# Patient Record
Sex: Male | Born: 1955
Health system: Southern US, Community
[De-identification: ages and names within clinical notes are randomized; demographics above are authoritative.]

## PROBLEM LIST (undated history)

## (undated) DIAGNOSIS — E7800 Pure hypercholesterolemia, unspecified: Secondary | ICD-10-CM

## (undated) DIAGNOSIS — N189 Chronic kidney disease, unspecified: Secondary | ICD-10-CM

## (undated) DIAGNOSIS — I619 Nontraumatic intracerebral hemorrhage, unspecified: Secondary | ICD-10-CM

## (undated) DIAGNOSIS — I639 Cerebral infarction, unspecified: Secondary | ICD-10-CM

## (undated) DIAGNOSIS — I10 Essential (primary) hypertension: Secondary | ICD-10-CM

## (undated) HISTORY — PX: SMALL INTESTINE SURGERY: SHX150

## (undated) HISTORY — DX: Nontraumatic intracerebral hemorrhage, unspecified: I61.9

## (undated) HISTORY — DX: Chronic kidney disease, unspecified: N18.9

---

## 2005-05-12 DIAGNOSIS — I619 Nontraumatic intracerebral hemorrhage, unspecified: Secondary | ICD-10-CM

## 2005-05-12 HISTORY — DX: Nontraumatic intracerebral hemorrhage, unspecified: I61.9

## 2005-05-12 HISTORY — PX: BRAIN SURGERY: SHX531

## 2007-05-27 ENCOUNTER — Emergency Department (HOSPITAL_COMMUNITY): Admission: EM | Admit: 2007-05-27 | Discharge: 2007-05-28 | Payer: Self-pay | Admitting: Emergency Medicine

## 2007-06-11 ENCOUNTER — Ambulatory Visit: Payer: Self-pay | Admitting: Internal Medicine

## 2007-06-17 ENCOUNTER — Ambulatory Visit: Payer: Self-pay | Admitting: Internal Medicine

## 2007-06-17 LAB — CONVERTED CEMR LAB
AST: 13 units/L (ref 0–37)
Albumin: 4.8 g/dL (ref 3.5–5.2)
Alkaline Phosphatase: 83 units/L (ref 39–117)
BUN: 31 mg/dL — ABNORMAL HIGH (ref 6–23)
Basophils Relative: 1 % (ref 0–1)
Creatinine, Ser: 3.16 mg/dL — ABNORMAL HIGH (ref 0.40–1.50)
Eosinophils Absolute: 0.3 10*3/uL (ref 0.0–0.7)
Eosinophils Relative: 9 % — ABNORMAL HIGH (ref 0–5)
Glucose, Bld: 83 mg/dL (ref 70–99)
HCT: 42.3 % (ref 39.0–52.0)
HDL: 42 mg/dL (ref >39)
LDL Cholesterol: 118 mg/dL — ABNORMAL HIGH (ref 0–99)
Lymphs Abs: 1.3 10*3/uL (ref 0.7–4.0)
MCHC: 32.2 g/dL (ref 30.0–36.0)
MCV: 82.9 fL (ref 78.0–100.0)
Monocytes Absolute: 0.2 10*3/uL (ref 0.1–1.0)
Monocytes Relative: 6 % (ref 3–12)
Neutrophils Relative %: 49 % (ref 43–77)
PSA: 0.36 ng/mL (ref 0.10–4.00)
Potassium: 4.2 meq/L (ref 3.5–5.3)
RBC: 5.1 M/uL (ref 4.22–5.81)
Total Bilirubin: 0.8 mg/dL (ref 0.3–1.2)
Total CHOL/HDL Ratio: 4.2
Triglycerides: 83 mg/dL (ref <150)
VLDL: 17 mg/dL (ref 0–40)
WBC: 3.6 10*3/uL — ABNORMAL LOW (ref 4.0–10.5)

## 2007-08-02 ENCOUNTER — Ambulatory Visit: Payer: Self-pay | Admitting: Family Medicine

## 2007-08-02 ENCOUNTER — Ambulatory Visit: Payer: Self-pay | Admitting: *Deleted

## 2007-11-01 ENCOUNTER — Ambulatory Visit: Payer: Self-pay | Admitting: Internal Medicine

## 2007-11-02 ENCOUNTER — Encounter: Payer: Self-pay | Admitting: Family Medicine

## 2007-11-02 LAB — CONVERTED CEMR LAB
Albumin: 4.4 g/dL (ref 3.5–5.2)
Basophils Absolute: 0 10*3/uL (ref 0.0–0.1)
Basophils Relative: 1 % (ref 0–1)
Chloride: 109 meq/L (ref 96–112)
Creatinine, Ser: 3.38 mg/dL — ABNORMAL HIGH (ref 0.40–1.50)
Hemoglobin: 12 g/dL — ABNORMAL LOW (ref 13.0–17.0)
Lymphocytes Relative: 31 % (ref 12–46)
MCHC: 30.9 g/dL (ref 30.0–36.0)
Monocytes Absolute: 0.3 10*3/uL (ref 0.1–1.0)
Neutro Abs: 1.9 10*3/uL (ref 1.7–7.7)
PTH: 181 pg/mL — ABNORMAL HIGH (ref 14.0–72.0)
Phosphorus: 2.8 mg/dL (ref 2.3–4.6)
Platelets: 219 10*3/uL (ref 150–400)
RDW: 14.4 % (ref 11.5–15.5)
Sodium: 144 meq/L (ref 135–145)
TSH: 1.849 microintl units/mL (ref 0.350–5.50)

## 2007-11-22 ENCOUNTER — Encounter: Admission: RE | Admit: 2007-11-22 | Discharge: 2008-02-20 | Payer: Self-pay | Admitting: Family Medicine

## 2008-02-14 ENCOUNTER — Ambulatory Visit: Payer: Self-pay | Admitting: Internal Medicine

## 2008-03-21 ENCOUNTER — Ambulatory Visit: Payer: Self-pay | Admitting: Family Medicine

## 2008-03-21 ENCOUNTER — Encounter: Payer: Self-pay | Admitting: Family Medicine

## 2008-03-21 LAB — CONVERTED CEMR LAB
Albumin: 4.3 g/dL (ref 3.5–5.2)
BUN: 21 mg/dL (ref 6–23)
Calcium: 9.5 mg/dL (ref 8.4–10.5)
Glucose, Bld: 91 mg/dL (ref 70–99)
Phosphorus: 2.6 mg/dL (ref 2.3–4.6)

## 2008-03-27 ENCOUNTER — Ambulatory Visit: Payer: Self-pay | Admitting: Family Medicine

## 2008-04-20 ENCOUNTER — Ambulatory Visit: Payer: Self-pay | Admitting: Internal Medicine

## 2008-04-20 ENCOUNTER — Encounter: Payer: Self-pay | Admitting: Family Medicine

## 2008-04-20 LAB — CONVERTED CEMR LAB: LDL Cholesterol: 77 mg/dL (ref 0–99)

## 2008-06-20 ENCOUNTER — Ambulatory Visit: Payer: Self-pay | Admitting: Family Medicine

## 2008-06-20 LAB — CONVERTED CEMR LAB
Albumin: 4.7 g/dL (ref 3.5–5.2)
BUN: 34 mg/dL — ABNORMAL HIGH (ref 6–23)
CO2: 19 meq/L (ref 19–32)
Calcium, Total (PTH): 9.7 mg/dL (ref 8.4–10.5)
Creatinine, Ser: 3.22 mg/dL — ABNORMAL HIGH (ref 0.40–1.50)
Eosinophils Relative: 8 % — ABNORMAL HIGH (ref 0–5)
Glucose, Bld: 103 mg/dL — ABNORMAL HIGH (ref 70–99)
HCT: 39.3 % (ref 39.0–52.0)
Hemoglobin: 13.1 g/dL (ref 13.0–17.0)
Lymphocytes Relative: 35 % (ref 12–46)
Lymphs Abs: 1.3 10*3/uL (ref 0.7–4.0)
Monocytes Absolute: 0.3 10*3/uL (ref 0.1–1.0)
Monocytes Relative: 7 % (ref 3–12)
RBC: 4.79 M/uL (ref 4.22–5.81)
Sodium: 141 meq/L (ref 135–145)
WBC: 3.8 10*3/uL — ABNORMAL LOW (ref 4.0–10.5)

## 2008-12-18 ENCOUNTER — Encounter: Payer: Self-pay | Admitting: Family Medicine

## 2008-12-18 ENCOUNTER — Ambulatory Visit: Payer: Self-pay | Admitting: Family Medicine

## 2008-12-18 LAB — CONVERTED CEMR LAB
Albumin: 4.5 g/dL (ref 3.5–5.2)
Alkaline Phosphatase: 64 units/L (ref 39–117)
BUN: 35 mg/dL — ABNORMAL HIGH (ref 6–23)
Basophils Absolute: 0 10*3/uL (ref 0.0–0.1)
Calcium: 9.3 mg/dL (ref 8.4–10.5)
Chloride: 109 meq/L (ref 96–112)
Cholesterol: 149 mg/dL (ref 0–200)
Eosinophils Relative: 8 % — ABNORMAL HIGH (ref 0–5)
Glucose, Bld: 77 mg/dL (ref 70–99)
HCT: 36.9 % — ABNORMAL LOW (ref 39.0–52.0)
HDL: 35 mg/dL — ABNORMAL LOW (ref >39)
Hemoglobin: 12.1 g/dL — ABNORMAL LOW (ref 13.0–17.0)
Lymphocytes Relative: 39 % (ref 12–46)
Monocytes Absolute: 0.4 10*3/uL (ref 0.1–1.0)
Potassium: 4.5 meq/L (ref 3.5–5.3)
RDW: 13.6 % (ref 11.5–15.5)
Triglycerides: 108 mg/dL (ref <150)

## 2009-03-26 ENCOUNTER — Encounter: Payer: Self-pay | Admitting: Family Medicine

## 2009-03-26 ENCOUNTER — Ambulatory Visit: Payer: Self-pay | Admitting: Internal Medicine

## 2009-03-26 LAB — CONVERTED CEMR LAB
BUN: 29 mg/dL — ABNORMAL HIGH (ref 6–23)
CO2: 23 meq/L (ref 19–32)
Calcium: 9.4 mg/dL (ref 8.4–10.5)
Chloride: 107 meq/L (ref 96–112)
Creatinine, Ser: 3.31 mg/dL — ABNORMAL HIGH (ref 0.40–1.50)

## 2009-10-23 ENCOUNTER — Ambulatory Visit: Payer: Self-pay | Admitting: Internal Medicine

## 2009-10-23 LAB — CONVERTED CEMR LAB
AST: 20 units/L (ref 0–37)
Albumin: 4.8 g/dL (ref 3.5–5.2)
BUN: 32 mg/dL — ABNORMAL HIGH (ref 6–23)
Calcium: 9.6 mg/dL (ref 8.4–10.5)
Chloride: 107 meq/L (ref 96–112)
Direct LDL: 97 mg/dL
Eosinophils Relative: 7 % — ABNORMAL HIGH (ref 0–5)
HCT: 39.7 % (ref 39.0–52.0)
Hemoglobin: 12.7 g/dL — ABNORMAL LOW (ref 13.0–17.0)
Lymphocytes Relative: 34 % (ref 12–46)
Lymphs Abs: 1.5 10*3/uL (ref 0.7–4.0)
Monocytes Absolute: 0.4 10*3/uL (ref 0.1–1.0)
Neutro Abs: 2.2 10*3/uL (ref 1.7–7.7)
Platelets: 199 10*3/uL (ref 150–400)
Potassium: 4.7 meq/L (ref 3.5–5.3)
Sodium: 143 meq/L (ref 135–145)
Total Protein: 7.3 g/dL (ref 6.0–8.3)
WBC: 4.4 10*3/uL (ref 4.0–10.5)

## 2010-01-16 ENCOUNTER — Ambulatory Visit: Payer: Self-pay | Admitting: Internal Medicine

## 2010-01-16 LAB — CONVERTED CEMR LAB
ALT: 14 units/L (ref 0–53)
BUN: 26 mg/dL — ABNORMAL HIGH (ref 6–23)
CO2: 26 meq/L (ref 19–32)
Calcium: 9.4 mg/dL (ref 8.4–10.5)
Creatinine, Ser: 3.24 mg/dL — ABNORMAL HIGH (ref 0.40–1.50)
Glucose, Bld: 91 mg/dL (ref 70–99)
Total Bilirubin: 0.7 mg/dL (ref 0.3–1.2)

## 2010-08-06 ENCOUNTER — Other Ambulatory Visit: Payer: Self-pay | Admitting: Internal Medicine

## 2010-08-07 NOTE — Telephone Encounter (Signed)
Patient is at [facility]

## 2011-06-23 DIAGNOSIS — I10 Essential (primary) hypertension: Secondary | ICD-10-CM | POA: Diagnosis not present

## 2011-06-23 DIAGNOSIS — E785 Hyperlipidemia, unspecified: Secondary | ICD-10-CM | POA: Diagnosis not present

## 2011-07-21 DIAGNOSIS — H00019 Hordeolum externum unspecified eye, unspecified eyelid: Secondary | ICD-10-CM | POA: Diagnosis not present

## 2011-08-07 DIAGNOSIS — H0019 Chalazion unspecified eye, unspecified eyelid: Secondary | ICD-10-CM | POA: Diagnosis not present

## 2012-02-27 ENCOUNTER — Emergency Department (HOSPITAL_COMMUNITY)
Admission: EM | Admit: 2012-02-27 | Discharge: 2012-02-27 | Disposition: A | Discharge status: Home / Self Care | Payer: Medicare Other | Attending: Emergency Medicine | Admitting: Emergency Medicine

## 2012-02-27 ENCOUNTER — Encounter (HOSPITAL_COMMUNITY): Payer: Self-pay | Admitting: Emergency Medicine

## 2012-02-27 DIAGNOSIS — E7800 Pure hypercholesterolemia, unspecified: Secondary | ICD-10-CM | POA: Insufficient documentation

## 2012-02-27 DIAGNOSIS — I10 Essential (primary) hypertension: Secondary | ICD-10-CM | POA: Insufficient documentation

## 2012-02-27 DIAGNOSIS — K089 Disorder of teeth and supporting structures, unspecified: Secondary | ICD-10-CM | POA: Insufficient documentation

## 2012-02-27 DIAGNOSIS — Z8673 Personal history of transient ischemic attack (TIA), and cerebral infarction without residual deficits: Secondary | ICD-10-CM | POA: Diagnosis not present

## 2012-02-27 DIAGNOSIS — K0889 Other specified disorders of teeth and supporting structures: Secondary | ICD-10-CM

## 2012-02-27 HISTORY — DX: Essential (primary) hypertension: I10

## 2012-02-27 HISTORY — DX: Cerebral infarction, unspecified: I63.9

## 2012-02-27 HISTORY — DX: Pure hypercholesterolemia, unspecified: E78.00

## 2012-02-27 MED ORDER — ONDANSETRON 4 MG PO TBDP
4.0000 mg | ORAL_TABLET | Freq: Once | ORAL | Status: AC
  Administered 2012-02-27: 4 mg via ORAL
  Filled 2012-02-27: qty 1

## 2012-02-27 MED ORDER — ONDANSETRON HCL 4 MG PO TABS: 4.0000 mg | ORAL_TABLET | Freq: Four times a day (QID) | ORAL | Status: DC

## 2012-02-27 MED ORDER — AMOXICILLIN 500 MG PO CAPS: 500.0000 mg | ORAL_CAPSULE | Freq: Three times a day (TID) | ORAL | Status: DC

## 2012-02-27 MED ORDER — OXYCODONE-ACETAMINOPHEN 5-325 MG PO TABS
2.0000 | ORAL_TABLET | Freq: Once | ORAL | Status: AC
  Administered 2012-02-27: 2 via ORAL
  Filled 2012-02-27: qty 2

## 2012-02-27 MED ORDER — OXYCODONE-ACETAMINOPHEN 5-325 MG PO TABS: 1.0000 | ORAL_TABLET | Freq: Four times a day (QID) | ORAL | Status: DC | PRN

## 2012-02-27 NOTE — ED Provider Notes (Signed)
History     CSN: [id number]  Arrival date & time 02/27/12  [id number]   First MD Initiated Contact with Patient 02/27/12 [phone]      Chief Complaint  Patient presents with  . Dental Pain    (Consider location/radiation/quality/duration/timing/severity/associated sxs/prior treatment) HPI  Patient presents to the emergency department with a dental complaint. Symptoms began this morning. The patient has tried to alleviate pain with Ibuprofen.  Pain rated at a 10/10, characterized as throbbing in nature and located bilateral lower molars. Patient denies fever, night sweats, chills, difficulty swallowing or opening mouth, SOB, nuchal rigidity or decreased ROM of neck.  Patient does not have a dentist and requests a resource guide at discharge.   Past Medical History  Diagnosis Date  . Hypertension   . Stroke   . Hypercholesteremia     History reviewed. No pertinent past surgical history.  No family history on file.  History  Substance Use Topics  . Smoking status: Never Smoker   . Smokeless tobacco: Not on file  . Alcohol Use: No      Review of Systems   Review of Systems  Gen: no weight loss, fevers, chills, night sweats  Eyes: no discharge or drainage, no occular pain or visual changes  Nose: no epistaxis or rhinorrhea  Mouth: + dental pain, no sore throat  Neck: no neck pain  Lungs:No wheezing, coughing or hemoptysis CV: no chest pain, palpitations, dependent edema or orthopnea  Abd: no abdominal pain, nausea, vomiting  GU: no dysuria or gross hematuria  MSK:  No abnormalities  Neuro: no headache, no focal neurologic deficits  Skin: no abnormalities Psyche: negative.    Allergies  Review of patient's allergies indicates no known allergies.  Home Medications  No current outpatient prescriptions on file.  BP 164/98  Pulse 63  Temp 97.8 F (36.6 C) (Oral)  Resp 14  SpO2 99%  Physical Exam  Nursing note and vitals reviewed. Constitutional: He appears  well-developed and well-nourished. No distress.  HENT:  Head: Normocephalic and atraumatic.  Mouth/Throat: Dental caries present.    Eyes: Conjunctivae normal and EOM are normal. Pupils are equal, round, and reactive to light.  Neck: Normal range of motion. Neck supple.  Cardiovascular: Normal rate and regular rhythm.   Pulmonary/Chest: Effort normal and breath sounds normal.  Neurological: He is alert.  Skin: Skin is warm and dry.    ED Course  Procedures (including critical care time)  Labs Reviewed - No data to display No results found.   1. Pain, dental       MDM  Pt given Rx for Percocets 5-325 (10 tabs) and Amoxcillin. Patient informed that they need to find a dentist and have the tooth pulled or the symptoms may be reoccurring. A Resource guide has been given with dental providers. Patient has been given return to ED precautions.         Linus Mako, [place] 02/27/12 [phone]

## 2012-02-27 NOTE — ED Notes (Signed)
PT. REPORTS LEFT LOWER MOLAR PAIN ONSET THIS MORNING .

## 2012-02-27 NOTE — ED Provider Notes (Signed)
Medical screening examination/treatment/procedure(s) were performed by non-physician practitioner and as supervising physician I was immediately available for consultation/collaboration.   Ezequiel Essex, MD 02/27/12 1650

## 2012-04-14 ENCOUNTER — Emergency Department (INDEPENDENT_AMBULATORY_CARE_PROVIDER_SITE_OTHER)
Admission: EM | Admit: 2012-04-14 | Discharge: 2012-04-14 | Disposition: A | Discharge status: Home / Self Care | Payer: Medicare Other | Source: Home / Self Care

## 2012-04-14 ENCOUNTER — Encounter (HOSPITAL_COMMUNITY): Payer: Self-pay

## 2012-04-14 DIAGNOSIS — N189 Chronic kidney disease, unspecified: Secondary | ICD-10-CM

## 2012-04-14 DIAGNOSIS — R351 Nocturia: Secondary | ICD-10-CM

## 2012-04-14 DIAGNOSIS — I10 Essential (primary) hypertension: Secondary | ICD-10-CM

## 2012-04-14 DIAGNOSIS — E785 Hyperlipidemia, unspecified: Secondary | ICD-10-CM

## 2012-04-14 LAB — BASIC METABOLIC PANEL
CO2: 26 mEq/L (ref 19–32)
Chloride: 102 mEq/L (ref 96–112)
Creatinine, Ser: 4.42 mg/dL — ABNORMAL HIGH (ref 0.50–1.35)
GFR calc Af Amer: 16 mL/min — ABNORMAL LOW (ref >90)
Potassium: 4.4 mEq/L (ref 3.5–5.1)

## 2012-04-14 MED ORDER — TAMSULOSIN HCL 0.4 MG PO CAPS: 0.4000 mg | ORAL_CAPSULE | Freq: Every day | ORAL | Status: DC

## 2012-04-14 MED ORDER — PRAVASTATIN SODIUM 40 MG PO TABS: 40.0000 mg | ORAL_TABLET | Freq: Every day | ORAL | Status: DC

## 2012-04-14 MED ORDER — CALCITRIOL 0.25 MCG PO CAPS: 0.2500 ug | ORAL_CAPSULE | Freq: Every day | ORAL | Status: DC

## 2012-04-14 MED ORDER — LISINOPRIL 40 MG PO TABS: 40.0000 mg | ORAL_TABLET | Freq: Every day | ORAL | Status: DC

## 2012-04-14 MED ORDER — LABETALOL HCL 300 MG PO TABS: 300.0000 mg | ORAL_TABLET | Freq: Two times a day (BID) | ORAL | Status: DC

## 2012-04-14 MED ORDER — AMLODIPINE BESYLATE 10 MG PO TABS: 10.0000 mg | ORAL_TABLET | Freq: Every day | ORAL | Status: DC

## 2012-04-14 NOTE — ED Notes (Signed)
Patient states here for medication refills

## 2012-04-14 NOTE — H&P (Addendum)
Patient Demographics  Jhavon Silvas, is a 56 y.o. male  [id number]  [id number]  DOB - July 11, 1955  Chief Complaint  Patient presents with  . Medication Refill        Subjective:   Mashawn Couzens is a 56 year old with history of hypertension, hyperlipidemia and CKD who presents today for medication refills. Denies chest pain,No abdominal pain - No Nausea, No new weakness tingling or numbness, No Cough - SOB. He admits to nocturia and postvoid dribbling. He denies fevers.  Objective:    Filed Vitals:   04/14/12 1634  BP: 110/82  Pulse: 59  Temp: 98.5 F (36.9 C)  TempSrc: Oral  Resp: 20  SpO2: 100%     Exam  Awake Alert, Oriented X 3, No new F.N deficits, Normal affect [place].AT,PERRAL Supple Neck,No JVD, No cervical lymphadenopathy appriciated.  Symmetrical Chest wall movement, Good air movement bilaterally, CTAB RRR,No Gallops,Rubs or new Murmurs, No Parasternal Heave +ve B.Sounds, Abd Soft, Non tender, No organomegaly appriciated, No rebound - guarding or rigidity. No Cyanosis, Clubbing or edema, no rash    Data Review   CBC No results found for this basename: WBC:5,HGB:5,HCT:5,PLT:5,MCV:5,MCH:5,MCHC:5,RDW:5,NEUTRABS:5,LYMPHSABS:5,MONOABS:5,EOSABS:5,BASOSABS:5,BANDABS:5,BANDSABD:5 in the last 168 hours  Chemistries   No results found for this basename: NA:5,K:5,CL:5,CO2:5,GLUCOSE:5,BUN:5,CREATININE:5,GFRCGP,:5,CALCIUM:5,MG:5,AST:5,ALT:5,ALKPHOS:5,BILITOT:5 in the last 168 hours ------------------------------------------------------------------------------------------------------------------ No results found for this basename: HGBA1C:2 in the last 72 hours ------------------------------------------------------------------------------------------------------------------ No results found for this basename: CHOL:2,HDL:2,LDLCALC:2,TRIG:2,CHOLHDL:2,LDLDIRECT:2 in the last 72  hours ------------------------------------------------------------------------------------------------------------------ No results found for this basename: TSH,T4TOTAL,FREET3,T3FREE,THYROIDAB in the last 72 hours ------------------------------------------------------------------------------------------------------------------ No results found for this basename: VITAMINB12:2,FOLATE:2,FERRITIN:2,TIBC:2,IRON:2,RETICCTPCT:2 in the last 72 hours  Coagulation profile  No results found for this basename: INR:5,PROTIME:5 in the last 168 hours     Prior to Admission medications   Medication Sig Start Date End Date Taking? Authorizing Provider  amLODipine (NORVASC) 10 MG tablet Take 1 tablet (10 mg total) by mouth daily. 04/14/12   Sheila Oats, MD  amoxicillin (AMOXIL) 500 MG capsule Take 1 capsule (500 mg total) by mouth 3 (three) times daily. 02/27/12   Linus Mako, PA  calcitRIOL (ROCALTROL) 0.25 MCG capsule Take 1 capsule (0.25 mcg total) by mouth daily. 04/14/12   Sheila Oats, MD  ibuprofen (ADVIL,MOTRIN) 200 MG tablet Take 400 mg by mouth every 6 (six) hours as needed.    Historical Provider, MD  labetalol (NORMODYNE) 300 MG tablet Take 1 tablet (300 mg total) by mouth 2 (two) times daily. 04/14/12   Sheila Oats, MD  lisinopril (PRINIVIL,ZESTRIL) 40 MG tablet Take 1 tablet (40 mg total) by mouth daily. 04/14/12   Sheila Oats, MD  ondansetron (ZOFRAN) 4 MG tablet Take 1 tablet (4 mg total) by mouth every 6 (six) hours. 02/27/12   Linus Mako, PA  oxyCODONE-acetaminophen (PERCOCET/ROXICET) 5-325 MG per tablet Take 1 tablet by mouth every 6 (six) hours as needed for pain. 02/27/12   Linus Mako, PA  pravastatin (PRAVACHOL) 40 MG tablet Take 1 tablet (40 mg total) by mouth daily. 04/14/12   Sheila Oats, MD  Flomax 0.4 mg by mouth daily   Assessment & Plan  Hypertension -Good control on current meds, Continue labetalol lisinopril and  Norvasc Hyperlipidemia -Continue pravastatin Chronic kidney disease -Obtain a Bmet to recheck creatinine, his last creatinine per Epic was 3.2 in 2011 and consider renal referral pending results. Nocturia/Probable BPH -Empirically treat with Flomax  I have refilled all of patient's medications Followup at [facility] in 2 months  Follow-up Information    Please  follow up. ([facility] in 69mos)           Taegen Lennox C M.D on 04/14/2012 at 5:25 PM   ADDENDUM 12/5 -Patient's creatinine on Bmet none 12/4 is now 4.42 (from 3.2 in 2007), I have called the patient and discuss this results and instructed him to hold off lisinopril at this time. He has also been referred to nephrology-patient to be called back with information on appointment from [facility]. Per wife heysaw a nephrologist sometime in 2007 but has not been back to see one in a long time.

## 2012-05-07 NOTE — ED Provider Notes (Signed)
ED provider note for 12/4, please see original note titled H&P  Sheila Oats, MD Physician Addendum  H&P 04/14/2012 5:25 PM  Patient Demographics   Brent Jimenez, is a 56 y.o. male   [id number]   [id number]   DOB - June 16, 1955    Chief Complaint   Patient presents with   .  Medication Refill             Subjective:      Brent Jimenez is a 56 year old with history of hypertension, hyperlipidemia and CKD who presents today for medication refills. Denies chest pain,No abdominal pain - No Nausea, No new weakness tingling or numbness, No Cough - SOB. He admits to nocturia and postvoid dribbling. He denies fevers.    Objective:         Filed Vitals:     04/14/12 1634   BP:  110/82   Pulse:  59   Temp:  98.5 F (36.9 C)   TempSrc:  Oral   Resp:  20   SpO2:  100%        Exam   Awake Alert, Oriented X 3, No new F.N deficits, Normal affect [place].AT,PERRAL Supple Neck,No JVD, No cervical lymphadenopathy appriciated.   Symmetrical Chest wall movement, Good air movement bilaterally, CTAB RRR,No Gallops,Rubs or new Murmurs, No Parasternal Heave +ve B.Sounds, Abd Soft, Non tender, No organomegaly appriciated, No rebound - guarding or rigidity. No Cyanosis, Clubbing or edema, no rash       Data Review    CBC No results found for this basename: WBC:5,HGB:5,HCT:5,PLT:5,MCV:5,MCH:5,MCHC:5,RDW:5,NEUTRABS:5,LYMPHSABS:5,MONOABS:5,EOSABS:5,BASOSABS:5,BANDABS:5,BANDSABD:5 in the last 168 hours   Chemistries     No results found for this basename: NA:5,K:5,CL:5,CO2:5,GLUCOSE:5,BUN:5,CREATININE:5,GFRCGP,:5,CALCIUM:5,MG:5,AST:5,ALT:5,ALKPHOS:5,BILITOT:5 in the last 168 hours ------------------------------------------------------------------------------------------------------------------ No results found for this basename: HGBA1C:2 in the last 72  hours ------------------------------------------------------------------------------------------------------------------ No results found for this basename: CHOL:2,HDL:2,LDLCALC:2,TRIG:2,CHOLHDL:2,LDLDIRECT:2 in the last 72 hours ------------------------------------------------------------------------------------------------------------------ No results found for this basename: TSH,T4TOTAL,FREET3,T3FREE,THYROIDAB in the last 72 hours ------------------------------------------------------------------------------------------------------------------ No results found for this basename: VITAMINB12:2,FOLATE:2,FERRITIN:2,TIBC:2,IRON:2,RETICCTPCT:2 in the last 72 hours   Coagulation profile   No results found for this basename: INR:5,PROTIME:5 in the last 168 hours          Prior to Admission medications    Medication  Sig  Start Date  End Date  Taking?  Authorizing Provider   amLODipine (NORVASC) 10 MG tablet  Take 1 tablet (10 mg total) by mouth daily.  04/14/12      Sheila Oats, MD   amoxicillin (AMOXIL) 500 MG capsule  Take 1 capsule (500 mg total) by mouth 3 (three) times daily.  02/27/12      Linus Mako, PA   calcitRIOL (ROCALTROL) 0.25 MCG capsule  Take 1 capsule (0.25 mcg total) by mouth daily.  04/14/12      Sheila Oats, MD   ibuprofen (ADVIL,MOTRIN) 200 MG tablet  Take 400 mg by mouth every 6 (six) hours as needed.        Historical Provider, MD   labetalol (NORMODYNE) 300 MG tablet  Take 1 tablet (300 mg total) by mouth 2 (two) times daily.  04/14/12      Sheila Oats, MD   lisinopril (PRINIVIL,ZESTRIL) 40 MG tablet  Take 1 tablet (40 mg total) by mouth daily.  04/14/12      Sheila Oats, MD   ondansetron (ZOFRAN) 4 MG tablet  Take 1 tablet (4 mg total) by mouth every 6 (six) hours.  02/27/12      Linus Mako,  PA   oxyCODONE-acetaminophen (PERCOCET/ROXICET) 5-325 MG per tablet  Take 1 tablet by mouth every 6 (six) hours as needed for pain.  02/27/12       Linus Mako, PA   pravastatin (PRAVACHOL) 40 MG tablet  Take 1 tablet (40 mg total) by mouth daily.  04/14/12      Sheila Oats, MD    Flomax 0.4 mg by mouth daily      Assessment & Plan    Hypertension -Good control on current meds, Continue labetalol lisinopril and Norvasc Hyperlipidemia -Continue pravastatin Chronic kidney disease -Obtain a Bmet to recheck creatinine, his last creatinine per Epic was 3.2 in 2011 and consider renal referral pending results. Nocturia/Probable BPH -Empirically treat with Flomax   I have refilled all of patient's medications Followup at [facility] in 2 months    Follow-up Information      Please follow up. ([facility] in 30mos)                 Jasaiah Karwowski C M.D on 04/14/2012 at 5:25 PM     ADDENDUM 12/5 -Patient's creatinine on Bmet none 12/4 is now 4.42 (from 3.2 in 2007), I have called the patient and discuss this results and instructed him to hold off lisinopril at this time. He has also been referred to nephrology-patient to be called back with information on appointment from [facility]. Per wife heysaw a nephrologist sometime in 2007 but has not been back to see one in a long time.  Revision History...     Date/Time User Action   04/15/2012 11:00 AM Sheila Oats, MD Addend   04/14/2012 5:33 PM Brent Jimenez Saralyn Pilar, MD Sign  View Details Report

## 2012-06-15 ENCOUNTER — Encounter (HOSPITAL_COMMUNITY): Payer: Self-pay

## 2012-06-15 ENCOUNTER — Emergency Department (INDEPENDENT_AMBULATORY_CARE_PROVIDER_SITE_OTHER)
Admission: EM | Admit: 2012-06-15 | Discharge: 2012-06-15 | Disposition: A | Discharge status: Home / Self Care | Payer: Medicare Other | Source: Home / Self Care | Attending: Family Medicine | Admitting: Family Medicine

## 2012-06-15 DIAGNOSIS — E785 Hyperlipidemia, unspecified: Secondary | ICD-10-CM | POA: Diagnosis not present

## 2012-06-15 DIAGNOSIS — I10 Essential (primary) hypertension: Secondary | ICD-10-CM

## 2012-06-15 DIAGNOSIS — R7309 Other abnormal glucose: Secondary | ICD-10-CM | POA: Diagnosis not present

## 2012-06-15 DIAGNOSIS — N189 Chronic kidney disease, unspecified: Secondary | ICD-10-CM

## 2012-06-15 DIAGNOSIS — N400 Enlarged prostate without lower urinary tract symptoms: Secondary | ICD-10-CM

## 2012-06-15 DIAGNOSIS — E559 Vitamin D deficiency, unspecified: Secondary | ICD-10-CM | POA: Diagnosis not present

## 2012-06-15 LAB — COMPREHENSIVE METABOLIC PANEL
Albumin: 4.7 g/dL (ref 3.5–5.2)
Alkaline Phosphatase: 83 U/L (ref 39–117)
BUN: 28 mg/dL — ABNORMAL HIGH (ref 6–23)
CO2: 26 mEq/L (ref 19–32)
Chloride: 103 mEq/L (ref 96–112)
Creatinine, Ser: 3.3 mg/dL — ABNORMAL HIGH (ref 0.50–1.35)
GFR calc Af Amer: 22 mL/min — ABNORMAL LOW (ref >90)
GFR calc non Af Amer: 19 mL/min — ABNORMAL LOW (ref >90)
Glucose, Bld: 94 mg/dL (ref 70–99)
Potassium: 4.9 mEq/L (ref 3.5–5.1)
Total Bilirubin: 0.6 mg/dL (ref 0.3–1.2)

## 2012-06-15 LAB — CBC
HCT: 40.1 % (ref 39.0–52.0)
Hemoglobin: 13.1 g/dL (ref 13.0–17.0)
MCV: 81.5 fL (ref 78.0–100.0)
RDW: 13.6 % (ref 11.5–15.5)
WBC: 3.5 10*3/uL — ABNORMAL LOW (ref 4.0–10.5)

## 2012-06-15 LAB — LIPID PANEL
HDL: 47 mg/dL (ref >39)
LDL Cholesterol: 91 mg/dL (ref 0–99)

## 2012-06-15 MED ORDER — PRAVASTATIN SODIUM 40 MG PO TABS: 40.0000 mg | ORAL_TABLET | Freq: Every day | ORAL | Status: DC

## 2012-06-15 MED ORDER — LABETALOL HCL 300 MG PO TABS: 300.0000 mg | ORAL_TABLET | Freq: Two times a day (BID) | ORAL | Status: DC

## 2012-06-15 MED ORDER — CALCITRIOL 0.25 MCG PO CAPS: 0.2500 ug | ORAL_CAPSULE | Freq: Every day | ORAL | Status: DC

## 2012-06-15 MED ORDER — TAMSULOSIN HCL 0.4 MG PO CAPS: 0.4000 mg | ORAL_CAPSULE | Freq: Every day | ORAL | Status: DC

## 2012-06-15 MED ORDER — AMLODIPINE BESYLATE 10 MG PO TABS: 10.0000 mg | ORAL_TABLET | Freq: Every day | ORAL | Status: DC

## 2012-06-15 NOTE — ED Notes (Signed)
Follow up- medication refill

## 2012-06-15 NOTE — ED Provider Notes (Signed)
History     CSN: [id number]  Arrival date & time 06/15/12  1149   First MD Initiated Contact with Patient 06/15/12 1238     Chief Complaint  Patient presents with  . Follow-up   The history is provided by the patient and the spouse. No language interpreter was used.   This patient is a 57 year old gentleman who has suffered with chronic kidney disease for the past 7 years.  The patient has a long history of poorly controlled hypertension.  He only started treatment for his hypertension in 2007.  He has relocated to this area from [place].  He reports that when he was in [place] he was told that he was near dialysis.  Fortunately, after that his blood pressure became better controlled and his kidney function improved.  Subsequently he's been monitored sporadically and his kidney function has started to decline once again.  His most recent creatinine was 4.  The patient reports that he feels well.  He takes his medications religiously.  His wife is with him today and she reports that he has been compliant with taking his medications.  Unfortunately, the patient received ibuprofen in 2013 for a dental abscess pain.  The patient says he's no longer using any NSAID medications at this time.  The patient was supposed to be referred to a nephrologist back in December 2013 but he never received any information about the appointment.  The patient says he has no history of diabetes mellitus.  The patient was recently started on Flomax for BPH symptoms.  He reports that he's noticed some improvement since he's been taking the Flomax.  The patient says that the feeling of incomplete bladder emptying is improving.  The patient has no other complaints.  He is very concerned about his renal function.  He would like to see a nephrologist.  The patient is very physically active and walks 3-4 miles per day.  Past Medical History  Diagnosis Date  . Hypertension   . Stroke   . Hypercholesteremia     Past Surgical  History  Procedure Date  . Brain surgery     No family history on file.  History  Substance Use Topics  . Smoking status: Never Smoker   . Smokeless tobacco: Not on file  . Alcohol Use: No    Review of Systems  Constitutional: Negative for unexpected weight change.  HENT: Positive for facial swelling.   Cardiovascular: Negative for leg swelling.  Genitourinary: Positive for urgency and frequency.  All other systems reviewed and are negative.    Allergies  Review of patient's allergies indicates no known allergies.  Home Medications   Current Outpatient Rx  Name  Route  Sig  Dispense  Refill  . AMLODIPINE BESYLATE 10 MG PO TABS   Oral   Take 1 tablet (10 mg total) by mouth daily.   30 tablet   1   . AMOXICILLIN 500 MG PO CAPS   Oral   Take 1 capsule (500 mg total) by mouth 3 (three) times daily.   21 capsule   0   . CALCITRIOL 0.25 MCG PO CAPS   Oral   Take 1 capsule (0.25 mcg total) by mouth daily.   30 capsule   1   . IBUPROFEN 200 MG PO TABS   Oral   Take 400 mg by mouth every 6 (six) hours as needed.         Marland Kitchen LABETALOL HCL 300 MG PO TABS  Oral   Take 1 tablet (300 mg total) by mouth 2 (two) times daily.   60 tablet   1   . LISINOPRIL 40 MG PO TABS   Oral   Take 1 tablet (40 mg total) by mouth daily.   30 tablet   1   . ONDANSETRON HCL 4 MG PO TABS   Oral   Take 1 tablet (4 mg total) by mouth every 6 (six) hours.   12 tablet   0   . OXYCODONE-ACETAMINOPHEN 5-325 MG PO TABS   Oral   Take 1 tablet by mouth every 6 (six) hours as needed for pain.   15 tablet   0   . PRAVASTATIN SODIUM 40 MG PO TABS   Oral   Take 1 tablet (40 mg total) by mouth daily.   30 tablet   1   . TAMSULOSIN HCL 0.4 MG PO CAPS   Oral   Take 1 capsule (0.4 mg total) by mouth daily.   30 capsule   1     BP 125/98  Pulse 76  Temp 97.7 F (36.5 C) (Oral)  Resp 17  SpO2 100%  Physical Exam  Nursing note and vitals reviewed. Constitutional: He is  oriented to person, place, and time. He appears well-developed and well-nourished. No distress.  HENT:  Head: Normocephalic and atraumatic.  Right Ear: External ear normal.  Left Ear: External ear normal.  Nose: Nose normal.  Eyes: Conjunctivae normal and EOM are normal. Pupils are equal, round, and reactive to light.  Neck: Normal range of motion. Neck supple. No JVD present. No thyromegaly present.  Cardiovascular: Normal rate, regular rhythm, normal heart sounds and intact distal pulses.   No murmur heard. Pulmonary/Chest: Effort normal and breath sounds normal. No respiratory distress. He has no wheezes. He has no rales. He exhibits no tenderness.  Abdominal: Soft. Bowel sounds are normal. He exhibits no distension and no mass. There is no tenderness. There is no rebound and no guarding.  Musculoskeletal: Normal range of motion. He exhibits no edema and no tenderness.  Lymphadenopathy:    He has no cervical adenopathy.  Neurological: He is alert and oriented to person, place, and time.  Skin: Skin is warm and dry.  Psychiatric: He has a normal mood and affect. His behavior is normal. Judgment and thought content normal.       Patient seems very slow to answer questions and has poor understanding of his disease process    ED Course  Procedures (including critical care time)  Labs Reviewed - No data to display No results found.   No diagnosis found.  MDM  IMPRESSION  Chronic kidney disease, stage IV  Hypertension, fairly well controlled on current medication regimen  BPH  Hyperlipidemia  RECOMMENDATIONS / PLAN I like to have the patient referred for a nephrology evaluation.  I did refill his medications.  He is taking Lopressor 300 mg by mouth twice a day.  He also takes amlodipine 10 mg daily.  I ordered to check his renal function today.  I ordered a metabolic panel.  I also ordered vitamin D level.  We'll check a CBC today.  Will continue tamsulosin as he is having some  benefit from the medication.  Also, will check his lipid panel.  I'm going to screen him for diabetes mellitus today.  I'd like to get a hemoglobin A1c. I had a long discussion with the patient today about her renal diet.  I provided him with  some patient education information about chronic kidney disease today.  I also had a long discussion with the patient and his wife.  The patient declined a flu vaccine today.  FOLLOW UP 3 months  The patient was given clear instructions to go to ER or return to medical center if symptoms don't improve, worsen or new problems develop.  The patient verbalized understanding.  The patient was told to call to get lab results if they haven't heard anything in the next week.            Murlean Iba, MD 06/15/12 1445

## 2012-06-16 LAB — PSA: PSA: 0.65 ng/mL (ref <=4.00)

## 2012-06-16 LAB — HEMOGLOBIN A1C
Hgb A1c MFr Bld: 6.1 % — ABNORMAL HIGH (ref <5.7)
Mean Plasma Glucose: 128 mg/dL — ABNORMAL HIGH (ref <117)

## 2012-06-16 LAB — VITAMIN D 25 HYDROXY (VIT D DEFICIENCY, FRACTURES): Vit D, 25-Hydroxy: 15 ng/mL — ABNORMAL LOW (ref 30–89)

## 2012-06-16 NOTE — Progress Notes (Signed)
Quick Note:  Please notify patient that his kidney function came back slightly improved from December. His blood sugar was elevated and his A1c was elevated suggestion that he has prediabetes. Please send him some information on diabetic diet. He should cut down on carbs and concentrated sweets. His vitamin D level came back very low. Recommend that he start taking OTC vitamin D 5000 IU po daily. Please make sure pt received his appointment to see the kidney specialist. Other labs OK.   Gerlene Fee, MD, CDE, [place], [place]   ______

## 2012-08-19 DIAGNOSIS — I129 Hypertensive chronic kidney disease with stage 1 through stage 4 chronic kidney disease, or unspecified chronic kidney disease: Secondary | ICD-10-CM | POA: Diagnosis not present

## 2012-08-19 DIAGNOSIS — D649 Anemia, unspecified: Secondary | ICD-10-CM | POA: Diagnosis not present

## 2012-08-19 DIAGNOSIS — N184 Chronic kidney disease, stage 4 (severe): Secondary | ICD-10-CM | POA: Diagnosis not present

## 2012-08-19 DIAGNOSIS — N2581 Secondary hyperparathyroidism of renal origin: Secondary | ICD-10-CM | POA: Diagnosis not present

## 2012-08-23 DIAGNOSIS — N184 Chronic kidney disease, stage 4 (severe): Secondary | ICD-10-CM | POA: Diagnosis not present

## 2012-11-11 ENCOUNTER — Other Ambulatory Visit: Payer: Self-pay | Admitting: Family Medicine

## 2012-12-03 ENCOUNTER — Other Ambulatory Visit: Payer: Self-pay | Admitting: Family Medicine

## 2012-12-06 NOTE — Telephone Encounter (Signed)
Medication refill

## 2013-01-05 DIAGNOSIS — N184 Chronic kidney disease, stage 4 (severe): Secondary | ICD-10-CM | POA: Diagnosis not present

## 2013-01-05 DIAGNOSIS — I10 Essential (primary) hypertension: Secondary | ICD-10-CM | POA: Diagnosis not present

## 2013-01-05 DIAGNOSIS — I129 Hypertensive chronic kidney disease with stage 1 through stage 4 chronic kidney disease, or unspecified chronic kidney disease: Secondary | ICD-10-CM | POA: Diagnosis not present

## 2013-01-05 DIAGNOSIS — N2581 Secondary hyperparathyroidism of renal origin: Secondary | ICD-10-CM | POA: Diagnosis not present

## 2013-01-05 DIAGNOSIS — D649 Anemia, unspecified: Secondary | ICD-10-CM | POA: Diagnosis not present

## 2013-04-06 DIAGNOSIS — N2581 Secondary hyperparathyroidism of renal origin: Secondary | ICD-10-CM | POA: Diagnosis not present

## 2013-04-06 DIAGNOSIS — N184 Chronic kidney disease, stage 4 (severe): Secondary | ICD-10-CM | POA: Diagnosis not present

## 2013-04-06 DIAGNOSIS — D649 Anemia, unspecified: Secondary | ICD-10-CM | POA: Diagnosis not present

## 2013-04-13 DIAGNOSIS — N184 Chronic kidney disease, stage 4 (severe): Secondary | ICD-10-CM | POA: Diagnosis not present

## 2013-04-13 DIAGNOSIS — I10 Essential (primary) hypertension: Secondary | ICD-10-CM | POA: Diagnosis not present

## 2013-04-13 DIAGNOSIS — D631 Anemia in chronic kidney disease: Secondary | ICD-10-CM | POA: Diagnosis not present

## 2013-04-13 DIAGNOSIS — N2581 Secondary hyperparathyroidism of renal origin: Secondary | ICD-10-CM | POA: Diagnosis not present

## 2013-07-11 DIAGNOSIS — N184 Chronic kidney disease, stage 4 (severe): Secondary | ICD-10-CM | POA: Diagnosis not present

## 2013-07-11 DIAGNOSIS — N2581 Secondary hyperparathyroidism of renal origin: Secondary | ICD-10-CM | POA: Diagnosis not present

## 2013-07-18 DIAGNOSIS — N2581 Secondary hyperparathyroidism of renal origin: Secondary | ICD-10-CM | POA: Diagnosis not present

## 2013-07-18 DIAGNOSIS — N184 Chronic kidney disease, stage 4 (severe): Secondary | ICD-10-CM | POA: Diagnosis not present

## 2013-07-18 DIAGNOSIS — I10 Essential (primary) hypertension: Secondary | ICD-10-CM | POA: Diagnosis not present

## 2013-07-18 DIAGNOSIS — D631 Anemia in chronic kidney disease: Secondary | ICD-10-CM | POA: Diagnosis not present

## 2013-10-18 DIAGNOSIS — N184 Chronic kidney disease, stage 4 (severe): Secondary | ICD-10-CM | POA: Diagnosis not present

## 2013-10-18 DIAGNOSIS — E785 Hyperlipidemia, unspecified: Secondary | ICD-10-CM | POA: Diagnosis not present

## 2013-10-18 DIAGNOSIS — D631 Anemia in chronic kidney disease: Secondary | ICD-10-CM | POA: Diagnosis not present

## 2013-10-18 DIAGNOSIS — N2581 Secondary hyperparathyroidism of renal origin: Secondary | ICD-10-CM | POA: Diagnosis not present

## 2013-10-18 DIAGNOSIS — N039 Chronic nephritic syndrome with unspecified morphologic changes: Secondary | ICD-10-CM | POA: Diagnosis not present

## 2013-10-25 DIAGNOSIS — N2581 Secondary hyperparathyroidism of renal origin: Secondary | ICD-10-CM | POA: Diagnosis not present

## 2013-10-25 DIAGNOSIS — I10 Essential (primary) hypertension: Secondary | ICD-10-CM | POA: Diagnosis not present

## 2013-10-25 DIAGNOSIS — D631 Anemia in chronic kidney disease: Secondary | ICD-10-CM | POA: Diagnosis not present

## 2013-10-25 DIAGNOSIS — N184 Chronic kidney disease, stage 4 (severe): Secondary | ICD-10-CM | POA: Diagnosis not present

## 2013-11-30 ENCOUNTER — Ambulatory Visit: Payer: Medicare Other | Attending: Internal Medicine | Admitting: Internal Medicine

## 2013-11-30 ENCOUNTER — Encounter: Payer: Self-pay | Admitting: Internal Medicine

## 2013-11-30 VITALS — BP 129/87 | HR 71 | Temp 98.2°F | Resp 16 | Wt 242.6 lb

## 2013-11-30 DIAGNOSIS — N289 Disorder of kidney and ureter, unspecified: Secondary | ICD-10-CM

## 2013-11-30 DIAGNOSIS — N400 Enlarged prostate without lower urinary tract symptoms: Secondary | ICD-10-CM

## 2013-11-30 DIAGNOSIS — N529 Male erectile dysfunction, unspecified: Secondary | ICD-10-CM

## 2013-11-30 DIAGNOSIS — Z1211 Encounter for screening for malignant neoplasm of colon: Secondary | ICD-10-CM

## 2013-11-30 DIAGNOSIS — K029 Dental caries, unspecified: Secondary | ICD-10-CM

## 2013-11-30 DIAGNOSIS — E559 Vitamin D deficiency, unspecified: Secondary | ICD-10-CM

## 2013-11-30 DIAGNOSIS — R7303 Prediabetes: Secondary | ICD-10-CM

## 2013-11-30 DIAGNOSIS — E785 Hyperlipidemia, unspecified: Secondary | ICD-10-CM

## 2013-11-30 DIAGNOSIS — I10 Essential (primary) hypertension: Secondary | ICD-10-CM

## 2013-11-30 DIAGNOSIS — N189 Chronic kidney disease, unspecified: Secondary | ICD-10-CM | POA: Insufficient documentation

## 2013-11-30 DIAGNOSIS — I69920 Aphasia following unspecified cerebrovascular disease: Secondary | ICD-10-CM | POA: Insufficient documentation

## 2013-11-30 DIAGNOSIS — R7309 Other abnormal glucose: Secondary | ICD-10-CM | POA: Insufficient documentation

## 2013-11-30 DIAGNOSIS — Z139 Encounter for screening, unspecified: Secondary | ICD-10-CM

## 2013-11-30 HISTORY — DX: Vitamin D deficiency, unspecified: E55.9

## 2013-11-30 HISTORY — DX: Prediabetes: R73.03

## 2013-11-30 HISTORY — DX: Benign prostatic hyperplasia without lower urinary tract symptoms: N40.0

## 2013-11-30 HISTORY — DX: Male erectile dysfunction, unspecified: N52.9

## 2013-11-30 NOTE — Progress Notes (Signed)
MRN: [id number] Name: Brent Jimenez  Sex: male Age: 58 y.o. DOB: 04-08-1956  Allergies: Review of patient's allergies indicates no known allergies.  Chief Complaint  Patient presents with  . Follow-up    HPI: Patient is 58 y.o. male who has to of hypertension prediabetes BPH renal insufficiency hyperlipidemia vitamin D deficiency comes today for followup, patient also history of stroke in the past and has some aphasia, patient accompanied with his wife reported to have recently having symptoms of erectile dysfunction, patient denies any dysuria or any urinary frequency urgency, has been taking Flomax for BPH, patient follows up with  his nephrologist who has been prescribing him the medications, patient denies any chest pain shortness of breath numbness weakness. Patient also requesting referral to see a dentist has several dental cavities.  Past Medical History  Diagnosis Date  . Hypertension   . Stroke   . Hypercholesteremia     Past Surgical History  Procedure Laterality Date  . Brain surgery        Medication List       This list is accurate as of: 11/30/13  4:39 PM.  Always use your most recent med list.               amLODipine 10 MG tablet  Commonly known as:  NORVASC  TAKE 1 TABLET BY MOUTH ONCE DAILY     calcitRIOL 0.25 MCG capsule  Commonly known as:  ROCALTROL  Take 1 capsule (0.25 mcg total) by mouth daily.     labetalol 300 MG tablet  Commonly known as:  NORMODYNE  Take 1 tablet (300 mg total) by mouth 2 (two) times daily.     pravastatin 40 MG tablet  Commonly known as:  PRAVACHOL  Take 1 tablet (40 mg total) by mouth daily.     tamsulosin 0.4 MG Caps capsule  Commonly known as:  FLOMAX  Take 1 capsule (0.4 mg total) by mouth daily.     Vitamin D (Ergocalciferol) 50000 UNITS Caps capsule  Commonly known as:  DRISDOL  Take 50,000 Units by mouth every 7 (seven) days.        Meds ordered this encounter  Medications  . Vitamin D,  Ergocalciferol, (DRISDOL) 50000 UNITS CAPS capsule    Sig: Take 50,000 Units by mouth every 7 (seven) days.     There is no immunization history on file for this patient.  History reviewed. No pertinent family history.  History  Substance Use Topics  . Smoking status: Never Smoker   . Smokeless tobacco: Not on file  . Alcohol Use: No    Review of Systems   As noted in HPI  Filed Vitals:   11/30/13 1548  BP: 129/87  Pulse: 71  Temp: 98.2 F (36.8 C)  Resp: 16    Physical Exam  Physical Exam  Constitutional: He is oriented to person, place, and time. No distress.  HENT:  Dental cavities  Eyes: EOM are normal. Pupils are equal, round, and reactive to light.  Cardiovascular: Normal rate and regular rhythm.   Pulmonary/Chest: Breath sounds normal. No respiratory distress. He has no wheezes. He has no rales.  Abdominal: Soft. There is no tenderness. There is no rebound and no guarding.  Old surgical scar   Genitourinary:  No penile discharge rash or lymphadenopathy   Musculoskeletal: He exhibits no edema.  Neurological: He is alert and oriented to person, place, and time. He displays normal reflexes.    CBC  Component Value Date/Time   WBC 3.5* 06/15/2012 1302   RBC 4.92 06/15/2012 1302   HGB 13.1 06/15/2012 1302   HCT 40.1 06/15/2012 1302   PLT 188 06/15/2012 1302   MCV 81.5 06/15/2012 1302   LYMPHSABS 1.5 10/23/2009 2324   MONOABS 0.4 10/23/2009 2324   EOSABS 0.3 10/23/2009 2324   BASOSABS 0.0 10/23/2009 2324    CMP     Component Value Date/Time   NA 140 06/15/2012 1302   K 4.9 06/15/2012 1302   CL 103 06/15/2012 1302   CO2 26 06/15/2012 1302   GLUCOSE 94 06/15/2012 1302   BUN 28* 06/15/2012 1302   CREATININE 3.30* 06/15/2012 1302   CALCIUM 10.0 06/15/2012 1302   CALCIUM 9.7 06/20/2008 2050   PROT 8.1 06/15/2012 1302   ALBUMIN 4.7 06/15/2012 1302   AST 21 06/15/2012 1302   ALT 19 06/15/2012 1302   ALKPHOS 83 06/15/2012 1302   BILITOT 0.6 06/15/2012 1302   GFRNONAA 19* 06/15/2012 1302    GFRAA 22* 06/15/2012 1302    Lab Results  Component Value Date/Time   CHOL 151 06/15/2012  1:02 PM    No components found with this basename: hga1c    Lab Results  Component Value Date/Time   AST 21 06/15/2012  1:02 PM    Assessment and Plan  Essential hypertension, benign - Plan: Patient is on amlodipine and labetalol blood pressure is well controlled will check blood chemistry COMPLETE METABOLIC PANEL WITH GFR  Other and unspecified hyperlipidemia - Plan: Patient is on Pravachol we'll check Lipid panel  Renal insufficiency - Plan: Repeat blood chemistry patient is also following up with his nephrologist COMPLETE METABOLIC PANEL WITH GFR  BPH (benign prostatic hyperplasia) Continue with Flomax.  Unspecified vitamin D deficiency Patient is taking vitamin D supplement.  Prediabetes - Plan: Will repeat Hemoglobin A1c   Special screening for malignant neoplasms, colon - Plan: Ambulatory referral to Gastroenterology  Erectile dysfunction, unspecified erectile dysfunction type .Marland Kitchen will check blood work first.  Dental cavities - Plan: Ambulatory referral to Dentistry    Return in about 3 months (around 03/02/2014) for hypertension.  Lorayne Marek, MD

## 2013-11-30 NOTE — Progress Notes (Signed)
Patient here for follow up on his Hypertension Complains of decreased sex drive Would like a dental referral as well

## 2013-12-01 LAB — HEMOGLOBIN A1C
HEMOGLOBIN A1C: 6.1 % — AB (ref <5.7)
Mean Plasma Glucose: 128 mg/dL — ABNORMAL HIGH (ref <117)

## 2013-12-05 ENCOUNTER — Encounter: Payer: Self-pay | Admitting: Gastroenterology

## 2013-12-12 ENCOUNTER — Other Ambulatory Visit: Payer: Self-pay | Admitting: Internal Medicine

## 2013-12-12 DIAGNOSIS — I10 Essential (primary) hypertension: Secondary | ICD-10-CM

## 2013-12-22 ENCOUNTER — Ambulatory Visit: Payer: Medicare Other | Attending: Internal Medicine

## 2013-12-22 DIAGNOSIS — I10 Essential (primary) hypertension: Secondary | ICD-10-CM

## 2013-12-22 DIAGNOSIS — N289 Disorder of kidney and ureter, unspecified: Secondary | ICD-10-CM | POA: Diagnosis not present

## 2013-12-22 DIAGNOSIS — E785 Hyperlipidemia, unspecified: Secondary | ICD-10-CM

## 2013-12-22 LAB — COMPLETE METABOLIC PANEL WITH GFR
ALT: 14 U/L (ref 0–53)
AST: 17 U/L (ref 0–37)
Albumin: 4.6 g/dL (ref 3.5–5.2)
Alkaline Phosphatase: 65 U/L (ref 39–117)
BUN: 23 mg/dL (ref 6–23)
CO2: 24 mEq/L (ref 19–32)
CREATININE: 3.27 mg/dL — AB (ref 0.50–1.35)
Calcium: 9.4 mg/dL (ref 8.4–10.5)
Chloride: 107 mEq/L (ref 96–112)
GFR, EST AFRICAN AMERICAN: 23 mL/min — AB
GFR, Est Non African American: 20 mL/min — ABNORMAL LOW
Glucose, Bld: 99 mg/dL (ref 70–99)
Potassium: 4 mEq/L (ref 3.5–5.3)
Sodium: 140 mEq/L (ref 135–145)
TOTAL PROTEIN: 7.2 g/dL (ref 6.0–8.3)
Total Bilirubin: 0.7 mg/dL (ref 0.2–1.2)

## 2013-12-22 LAB — LIPID PANEL
Cholesterol: 131 mg/dL (ref 0–200)
HDL: 31 mg/dL — AB (ref >39)
LDL CALC: 77 mg/dL (ref 0–99)
Total CHOL/HDL Ratio: 4.2 Ratio
Triglycerides: 113 mg/dL (ref <150)
VLDL: 23 mg/dL (ref 0–40)

## 2013-12-28 ENCOUNTER — Ambulatory Visit (AMBULATORY_SURGERY_CENTER): Payer: Medicare Other | Admitting: *Deleted

## 2013-12-28 VITALS — Ht 76.0 in | Wt 245.6 lb

## 2013-12-28 DIAGNOSIS — Z1211 Encounter for screening for malignant neoplasm of colon: Secondary | ICD-10-CM

## 2013-12-28 MED ORDER — MOVIPREP 100 G PO SOLR: ORAL | Status: DC

## 2013-12-28 NOTE — Progress Notes (Signed)
No allergies to eggs or soy. No problems with anesthesia.  Pt given Emmi instructions for colonoscopy  No oxygen use  No diet drug use  

## 2014-01-10 ENCOUNTER — Telehealth: Payer: Self-pay | Admitting: Gastroenterology

## 2014-01-10 NOTE — Telephone Encounter (Signed)
yes

## 2014-01-11 ENCOUNTER — Encounter: Payer: Medicare Other | Admitting: Gastroenterology

## 2014-01-17 DIAGNOSIS — D631 Anemia in chronic kidney disease: Secondary | ICD-10-CM | POA: Diagnosis not present

## 2014-01-17 DIAGNOSIS — N2581 Secondary hyperparathyroidism of renal origin: Secondary | ICD-10-CM | POA: Diagnosis not present

## 2014-01-17 DIAGNOSIS — N184 Chronic kidney disease, stage 4 (severe): Secondary | ICD-10-CM | POA: Diagnosis not present

## 2014-01-24 DIAGNOSIS — N184 Chronic kidney disease, stage 4 (severe): Secondary | ICD-10-CM | POA: Diagnosis not present

## 2014-01-24 DIAGNOSIS — I10 Essential (primary) hypertension: Secondary | ICD-10-CM | POA: Diagnosis not present

## 2014-01-24 DIAGNOSIS — N039 Chronic nephritic syndrome with unspecified morphologic changes: Secondary | ICD-10-CM | POA: Diagnosis not present

## 2014-01-24 DIAGNOSIS — N2581 Secondary hyperparathyroidism of renal origin: Secondary | ICD-10-CM | POA: Diagnosis not present

## 2014-01-24 DIAGNOSIS — D631 Anemia in chronic kidney disease: Secondary | ICD-10-CM | POA: Diagnosis not present

## 2014-02-15 ENCOUNTER — Encounter (HOSPITAL_COMMUNITY): Payer: Self-pay | Admitting: Emergency Medicine

## 2014-02-15 ENCOUNTER — Emergency Department (HOSPITAL_COMMUNITY): Payer: Medicare Other

## 2014-02-15 ENCOUNTER — Emergency Department (HOSPITAL_COMMUNITY)
Admission: EM | Admit: 2014-02-15 | Discharge: 2014-02-15 | Disposition: A | Discharge status: Home / Self Care | Payer: Medicare Other | Attending: Emergency Medicine | Admitting: Emergency Medicine

## 2014-02-15 DIAGNOSIS — Y9389 Activity, other specified: Secondary | ICD-10-CM | POA: Diagnosis not present

## 2014-02-15 DIAGNOSIS — Y92410 Unspecified street and highway as the place of occurrence of the external cause: Secondary | ICD-10-CM | POA: Insufficient documentation

## 2014-02-15 DIAGNOSIS — Z8673 Personal history of transient ischemic attack (TIA), and cerebral infarction without residual deficits: Secondary | ICD-10-CM | POA: Insufficient documentation

## 2014-02-15 DIAGNOSIS — I129 Hypertensive chronic kidney disease with stage 1 through stage 4 chronic kidney disease, or unspecified chronic kidney disease: Secondary | ICD-10-CM | POA: Insufficient documentation

## 2014-02-15 DIAGNOSIS — E780 Pure hypercholesterolemia: Secondary | ICD-10-CM | POA: Insufficient documentation

## 2014-02-15 DIAGNOSIS — S0990XA Unspecified injury of head, initial encounter: Secondary | ICD-10-CM | POA: Insufficient documentation

## 2014-02-15 DIAGNOSIS — N189 Chronic kidney disease, unspecified: Secondary | ICD-10-CM | POA: Diagnosis not present

## 2014-02-15 DIAGNOSIS — Z79899 Other long term (current) drug therapy: Secondary | ICD-10-CM | POA: Insufficient documentation

## 2014-02-15 DIAGNOSIS — R51 Headache: Secondary | ICD-10-CM | POA: Diagnosis not present
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
## 2014-02-15 NOTE — ED Notes (Signed)
Declined W/C at D/C and was escorted to lobby by RN. 

## 2014-02-16 NOTE — ED Provider Notes (Signed)
Medical screening examination/treatment/procedure(s) were performed by non-physician practitioner and as supervising physician I was immediately available for consultation/collaboration.   EKG Interpretation None        Mariea Clonts, MD 02/16/14 [phone]

## 2014-09-14 ENCOUNTER — Ambulatory Visit: Payer: Medicare Other | Attending: Internal Medicine | Admitting: Internal Medicine

## 2014-09-14 ENCOUNTER — Encounter: Payer: Self-pay | Admitting: Internal Medicine

## 2014-09-14 ENCOUNTER — Ambulatory Visit: Payer: Medicare Other | Admitting: Internal Medicine

## 2014-09-14 VITALS — BP 132/92 | HR 67 | Temp 97.4°F | Resp 16 | Ht 76.0 in | Wt 237.0 lb

## 2014-09-14 DIAGNOSIS — Z125 Encounter for screening for malignant neoplasm of prostate: Secondary | ICD-10-CM

## 2014-09-14 DIAGNOSIS — I10 Essential (primary) hypertension: Secondary | ICD-10-CM | POA: Diagnosis not present

## 2014-09-14 DIAGNOSIS — N400 Enlarged prostate without lower urinary tract symptoms: Secondary | ICD-10-CM | POA: Diagnosis not present

## 2014-09-14 DIAGNOSIS — I129 Hypertensive chronic kidney disease with stage 1 through stage 4 chronic kidney disease, or unspecified chronic kidney disease: Secondary | ICD-10-CM | POA: Diagnosis not present

## 2014-09-14 DIAGNOSIS — R350 Frequency of micturition: Secondary | ICD-10-CM | POA: Diagnosis not present

## 2014-09-14 DIAGNOSIS — R7303 Prediabetes: Secondary | ICD-10-CM

## 2014-09-14 DIAGNOSIS — N529 Male erectile dysfunction, unspecified: Secondary | ICD-10-CM | POA: Diagnosis not present

## 2014-09-14 DIAGNOSIS — R7309 Other abnormal glucose: Secondary | ICD-10-CM | POA: Insufficient documentation

## 2014-09-14 DIAGNOSIS — N184 Chronic kidney disease, stage 4 (severe): Secondary | ICD-10-CM | POA: Diagnosis not present

## 2014-09-14 LAB — COMPLETE METABOLIC PANEL WITH GFR
ALBUMIN: 4.4 g/dL (ref 3.5–5.2)
ALT: 13 U/L (ref 0–53)
AST: 18 U/L (ref 0–37)
Alkaline Phosphatase: 62 U/L (ref 39–117)
BUN: 20 mg/dL (ref 6–23)
CHLORIDE: 105 meq/L (ref 96–112)
CO2: 23 meq/L (ref 19–32)
Calcium: 9.3 mg/dL (ref 8.4–10.5)
Creat: 3.24 mg/dL — ABNORMAL HIGH (ref 0.50–1.35)
GFR, EST AFRICAN AMERICAN: 23 mL/min — AB
GFR, EST NON AFRICAN AMERICAN: 20 mL/min — AB
GLUCOSE: 89 mg/dL (ref 70–99)
Potassium: 4.3 mEq/L (ref 3.5–5.3)
Sodium: 140 mEq/L (ref 135–145)
Total Bilirubin: 0.8 mg/dL (ref 0.2–1.2)
Total Protein: 7.3 g/dL (ref 6.0–8.3)

## 2014-09-14 LAB — CBC
HCT: 38.2 % — ABNORMAL LOW (ref 39.0–52.0)
HEMOGLOBIN: 12.9 g/dL — AB (ref 13.0–17.0)
MCH: 27 pg (ref 26.0–34.0)
MCHC: 33.8 g/dL (ref 30.0–36.0)
MCV: 80.1 fL (ref 78.0–100.0)
MPV: 8.8 fL (ref 8.6–12.4)
Platelets: 214 10*3/uL (ref 150–400)
RBC: 4.77 MIL/uL (ref 4.22–5.81)
RDW: 14.8 % (ref 11.5–15.5)
WBC: 4 10*3/uL (ref 4.0–10.5)

## 2014-09-14 LAB — LIPID PANEL
Cholesterol: 171 mg/dL (ref 0–200)
HDL: 34 mg/dL — AB (ref >=40)
LDL Cholesterol: 113 mg/dL — ABNORMAL HIGH (ref 0–99)
Total CHOL/HDL Ratio: 5 Ratio
Triglycerides: 120 mg/dL (ref <150)
VLDL: 24 mg/dL (ref 0–40)

## 2014-09-14 LAB — HEMOGLOBIN A1C
HEMOGLOBIN A1C: 6 % — AB (ref <5.7)
MEAN PLASMA GLUCOSE: 126 mg/dL — AB (ref <117)

## 2014-09-14 MED ORDER — CALCITRIOL 0.25 MCG PO CAPS: 0.2500 ug | ORAL_CAPSULE | Freq: Every day | ORAL | Status: DC

## 2014-09-14 MED ORDER — LABETALOL HCL 300 MG PO TABS: 300.0000 mg | ORAL_TABLET | Freq: Two times a day (BID) | ORAL | Status: DC

## 2014-09-14 MED ORDER — DOXAZOSIN MESYLATE 4 MG PO TABS: 4.0000 mg | ORAL_TABLET | Freq: Every day | ORAL | Status: DC

## 2014-09-14 MED ORDER — AMLODIPINE BESYLATE 10 MG PO TABS: 10.0000 mg | ORAL_TABLET | Freq: Every day | ORAL | Status: DC

## 2014-09-14 NOTE — Progress Notes (Signed)
Patient ID: Brent Jimenez, male   DOB: [birth year], 59 y.o.   MRN: [id number] Subjective:  Brent Jimenez is a 59 y.o. male with hypertension.  Currently on amlodipine, labetalol, calcitrol and Cardura for BPH. He is requesting blood work today.   Current Outpatient Prescriptions  Medication Sig Dispense Refill  . amLODipine (NORVASC) 10 MG tablet Take 10 mg by mouth daily.    . calcitRIOL (ROCALTROL) 0.25 MCG capsule Take 1 capsule (0.25 mcg total) by mouth daily. 30 capsule 3  . doxazosin (CARDURA) 4 MG tablet Take 4 mg by mouth daily.    Marland Kitchen labetalol (NORMODYNE) 300 MG tablet Take 1 tablet (300 mg total) by mouth 2 (two) times daily. 60 tablet 1  . pravastatin (PRAVACHOL) 40 MG tablet Take 1 tablet (40 mg total) by mouth daily. (Patient not taking: Reported on 09/14/2014) 30 tablet 3  . Tamsulosin HCl (FLOMAX) 0.4 MG CAPS Take 1 capsule (0.4 mg total) by mouth daily. (Patient not taking: Reported on 09/14/2014) 30 capsule 3  . [DISCONTINUED] lisinopril (PRINIVIL,ZESTRIL) 40 MG tablet Take 1 tablet (40 mg total) by mouth daily. 30 tablet 1   No current facility-administered medications for this visit.    Hypertension ROS: taking medications as instructed, no medication side effects noted, no TIA's, no chest pain on exertion, no dyspnea on exertion, no swelling of ankles and no palpitations.  New concerns: He reports that he was seeing a Nephrologist for his Kidney disease but states that he does not like him and would like to be referred some place else. He reports that he continues to have problems with getting an erection.   Objective:  BP 132/92 mmHg  Pulse 67  Temp(Src) 97.4 F (36.3 C) (Oral)  Resp 16  Ht 6\' 4"  (1.93 m)  Wt 237 lb (107.502 kg)  BMI 28.86 kg/m2  SpO2 98%  Appearance alert, well appearing, and in no distress, oriented to person, place, and time and normal appearing weight. General exam BP noted to be borderline elevated today in office, S1, S2 normal, no gallop, no  murmur, chest clear, no JVD, no HSM, no edema, CVS exam  - normal rate, regular rhythm, normal S1, S2, no murmurs, rubs, clicks or gallops, no JVD.  Lab review: orders written for new lab studies as appropriate; see orders.   Assessment:   Hypertension stable.   Brent Jimenez was seen today for follow-up.  Diagnoses and all orders for this visit:  CKD (chronic kidney disease) stage 4, GFR 15-29 ml/min Orders: -     Ambulatory referral to Nephrology--pt no longer wants to be seen by [facility].  -     Discontinue: calcitRIOL (ROCALTROL) 0.25 MCG capsule; Take 1 capsule (0.25 mcg total) by mouth daily. -     calcitRIOL (ROCALTROL) 0.25 MCG capsule; Take 1 capsule (0.25 mcg total) by mouth daily. Avoid nephrotoxic agents  Essential hypertension, benign Orders: -     labetalol (NORMODYNE) 300 MG tablet; Take 1 tablet (300 mg total) by mouth 2 (two) times daily. -     amLODipine (NORVASC) 10 MG tablet; Take 1 tablet (10 mg total) by mouth daily. -     Lipid panel -     COMPLETE METABOLIC PANEL WITH GFR -     CBC Continue current medication regimen   BPH (benign prostatic hyperplasia) Orders: -     doxazosin (CARDURA) 4 MG tablet; Take 1 tablet (4 mg total) by mouth at bedtime. Prescribed by Nephrology. Assuming it is for BPH and  HTN. Patient denies symptoms of BPH. Will request Nephrology notes for review.  Erectile dysfunction, unspecified erectile dysfunction type Orders: -     Testosterone Patient wants Viagra but is unable to afford. I have explained that we should look at causes before starting medication. Listening to patient, I highly believe there is conflict between patient and wife that may be causing problems with erections.   Prediabetes Orders: -     Hemoglobin A1c Last a1c was 6.1 which was last year.   Prostate cancer screening Needs a PSA level   Urinary frequency Orders: -     PSA  Return in about 3 months (around 12/15/2014) for Hypertension.  Chari Manning, NP 09/14/2014 12:53 PM

## 2014-09-14 NOTE — Patient Instructions (Signed)
Erectile Dysfunction Erectile dysfunction is the inability to get or sustain a good enough erection to have sexual intercourse. Erectile dysfunction may involve:  Inability to get an erection.  Lack of enough hardness to allow penetration.  Loss of the erection before sex is finished.  Premature ejaculation. CAUSES  Certain drugs, such as:  Pain relievers.  Antihistamines.  Antidepressants.  Blood pressure medicines.  Water pills (diuretics).  Ulcer medicines.  Muscle relaxants.  Illegal drugs.  Excessive drinking.  Psychological causes, such as:  Anxiety.  Depression.  Sadness.  Exhaustion.  Performance fear.  Stress.  Physical causes, such as:  Artery problems. This may include diabetes, smoking, liver disease, or atherosclerosis.  High blood pressure.  Hormonal problems, such as low testosterone.  Obesity.  Nerve problems. This may include back or pelvic injuries, diabetes mellitus, multiple sclerosis, or Parkinson disease. SYMPTOMS  Inability to get an erection.  Lack of enough hardness to allow penetration.  Loss of the erection before sex is finished.  Premature ejaculation.  Normal erections at some times, but with frequent unsatisfactory episodes.  Orgasms that are not satisfactory in sensation or frequency.  Low sexual satisfaction in either partner because of erection problems.  A curved penis occurring with erection. The curve may cause pain or may be too curved to allow for intercourse.  Never having nighttime erections. DIAGNOSIS Your caregiver can often diagnose this condition by:  Performing a physical exam to find other diseases or specific problems with the penis.  Asking you detailed questions about the problem.  Performing blood tests to check for diabetes mellitus or to measure hormone levels.  Performing urine tests to find other underlying health conditions.  Performing an ultrasound exam to check for  scarring.  Performing a test to check blood flow to the penis.  Doing a sleep study at home to measure nighttime erections. TREATMENT   You may be prescribed medicines by mouth.  You may be given medicine injections into the penis.  You may be prescribed a vacuum pump with a ring.  Penile implant surgery may be performed. You may receive:  An inflatable implant.  A semirigid implant.  Blood vessel surgery may be performed. HOME CARE INSTRUCTIONS  If you are prescribed oral medicine, you should take the medicine as prescribed. Do not increase the dosage without first discussing it with your physician.  If you are using self-injections, be careful to avoid any veins that are on the surface of the penis. Apply pressure to the injection site for 5 minutes.  If you are using a vacuum pump, make sure you have read the instructions before using it. Discuss any questions with your physician before taking the pump home. SEEK MEDICAL CARE IF:  You experience pain that is not responsive to the pain medicine you have been prescribed.  You experience nausea or vomiting. SEEK IMMEDIATE MEDICAL CARE IF:   When taking oral or injectable medications, you experience an erection that lasts longer than 4 hours. If your physician is unavailable, go to the nearest emergency room for evaluation. An erection that lasts much longer than 4 hours can result in permanent damage to your penis.  You have pain that is severe.  You develop redness, severe pain, or severe swelling of your penis.  You have redness spreading up into your groin or lower abdomen.  You are unable to pass your urine. Document Released: 04/25/2000 Document Revised: 12/29/2012 Document Reviewed: 09/30/2012 [facility] Patient Information 2015 [place], [place]. This information is not  intended to replace advice given to you by your health care provider. Make sure you discuss any questions you have with your health care provider.

## 2014-09-14 NOTE — Progress Notes (Signed)
Pt is here following up on his HTN. Pt is fasting and he is requesting labs today.

## 2014-09-15 LAB — TESTOSTERONE: TESTOSTERONE: 503 ng/dL (ref 300–890)

## 2014-09-15 LAB — PSA: PSA: 0.65 ng/mL (ref <=4.00)

## 2014-09-20 ENCOUNTER — Telehealth: Payer: Self-pay | Admitting: *Deleted

## 2014-09-20 NOTE — Telephone Encounter (Signed)
Left VM for pt to return my call  

## 2014-09-20 NOTE — Telephone Encounter (Signed)
-----   Message from Lance Bosch, NP sent at 09/19/2014  7:55 PM EDT ----- Kidney function is the same. Testosterone level is normal so that is not causing sexual dysfunction. Cholesterol is worse today that it was 9 months ago. Find out if he has been taking pravastatin daily. Go over diet with patient.

## 2014-09-27 ENCOUNTER — Telehealth: Payer: Self-pay | Admitting: Internal Medicine

## 2014-09-27 NOTE — Telephone Encounter (Signed)
Patient is calling back about results, please f/u

## 2014-10-17 ENCOUNTER — Telehealth: Payer: Self-pay | Admitting: *Deleted

## 2014-10-17 DIAGNOSIS — E785 Hyperlipidemia, unspecified: Secondary | ICD-10-CM

## 2014-10-17 MED ORDER — PRAVASTATIN SODIUM 40 MG PO TABS: 40.0000 mg | ORAL_TABLET | Freq: Every day | ORAL | Status: DC

## 2014-10-17 NOTE — Telephone Encounter (Signed)
Spoke to patient's wife regarding his lab results.  She states patient has not been taking his prevastatin.  I sent refill to patient's pharmacy [facility]) and discussed patient's diet with his wife.  She appreciated the call and said they would pick up the medication.

## 2014-12-20 DIAGNOSIS — N184 Chronic kidney disease, stage 4 (severe): Secondary | ICD-10-CM | POA: Diagnosis not present

## 2014-12-20 DIAGNOSIS — N189 Chronic kidney disease, unspecified: Secondary | ICD-10-CM | POA: Diagnosis not present

## 2014-12-20 DIAGNOSIS — N2581 Secondary hyperparathyroidism of renal origin: Secondary | ICD-10-CM | POA: Diagnosis not present

## 2014-12-21 DIAGNOSIS — N184 Chronic kidney disease, stage 4 (severe): Secondary | ICD-10-CM | POA: Diagnosis not present

## 2014-12-21 DIAGNOSIS — D631 Anemia in chronic kidney disease: Secondary | ICD-10-CM | POA: Diagnosis not present

## 2014-12-21 DIAGNOSIS — N2581 Secondary hyperparathyroidism of renal origin: Secondary | ICD-10-CM | POA: Diagnosis not present

## 2014-12-21 DIAGNOSIS — I10 Essential (primary) hypertension: Secondary | ICD-10-CM | POA: Diagnosis not present

## 2015-02-12 ENCOUNTER — Other Ambulatory Visit: Payer: Self-pay | Admitting: Internal Medicine

## 2015-02-22 ENCOUNTER — Encounter: Payer: Self-pay | Admitting: Internal Medicine

## 2015-02-22 ENCOUNTER — Ambulatory Visit: Payer: Medicare Other | Attending: Internal Medicine | Admitting: Internal Medicine

## 2015-02-22 VITALS — BP 123/83 | HR 68 | Temp 98.0°F | Resp 16 | Ht 76.0 in | Wt 236.0 lb

## 2015-02-22 DIAGNOSIS — R21 Rash and other nonspecific skin eruption: Secondary | ICD-10-CM | POA: Diagnosis present

## 2015-02-22 DIAGNOSIS — Z8673 Personal history of transient ischemic attack (TIA), and cerebral infarction without residual deficits: Secondary | ICD-10-CM | POA: Insufficient documentation

## 2015-02-22 DIAGNOSIS — I10 Essential (primary) hypertension: Secondary | ICD-10-CM | POA: Diagnosis not present

## 2015-02-22 DIAGNOSIS — E7800 Pure hypercholesterolemia, unspecified: Secondary | ICD-10-CM | POA: Diagnosis not present

## 2015-02-22 DIAGNOSIS — Z79899 Other long term (current) drug therapy: Secondary | ICD-10-CM | POA: Diagnosis not present

## 2015-02-22 DIAGNOSIS — N184 Chronic kidney disease, stage 4 (severe): Secondary | ICD-10-CM

## 2015-02-22 DIAGNOSIS — I129 Hypertensive chronic kidney disease with stage 1 through stage 4 chronic kidney disease, or unspecified chronic kidney disease: Secondary | ICD-10-CM | POA: Diagnosis not present

## 2015-02-22 DIAGNOSIS — L309 Dermatitis, unspecified: Secondary | ICD-10-CM

## 2015-02-22 MED ORDER — CLOBETASOL PROPIONATE 0.05 % EX CREA: 1.0000 "application " | TOPICAL_CREAM | Freq: Two times a day (BID) | CUTANEOUS | Status: DC

## 2015-02-22 MED ORDER — CALCITRIOL 0.25 MCG PO CAPS
0.2500 ug | ORAL_CAPSULE | Freq: Every day | ORAL | Status: DC
Start: 2015-02-22 — End: 2015-08-29

## 2015-02-22 MED ORDER — LABETALOL HCL 300 MG PO TABS: 300.0000 mg | ORAL_TABLET | Freq: Two times a day (BID) | ORAL | Status: DC

## 2015-02-22 MED ORDER — AMLODIPINE BESYLATE 10 MG PO TABS: 10.0000 mg | ORAL_TABLET | Freq: Every day | ORAL | Status: DC

## 2015-02-22 NOTE — Progress Notes (Signed)
Patient ID: Brent Jimenez, male   DOB: [birth year], 59 y.o.   MRN: [id number]  CC: rash  HPI: Brent Jimenez is a 59 y.o. male here today for evaluation of a rash.  Patient has past medical history of hypertension, stroke, and, CKD stage 4. Patient reports that he noticed a rash between his ring finger and middle finger on his right hand for 2 weeks. The rash comes intermittently and is pruritic at times. Rash has become hyperpigmented and bleeds when he scratches it. He denies any new exposures, foods, cleaning products, lotions. He does not have a history of eczema or allergies. There is no drainage from rash or blisters. Patient is requesting refills on his medications. States that Nephrology increased his doxazosin to 8 mg daily.  Patient has No headache, No chest pain, No abdominal pain - No Nausea, No new weakness tingling or numbness, No Cough - SOB.  No Known Allergies Past Medical History  Diagnosis Date  . Hypertension   . Stroke ([place])   . Hypercholesteremia   . Brain bleed ([place]) 2007    expressive asphasia  . Chronic kidney disease    Current Outpatient Prescriptions on File Prior to Visit  Medication Sig Dispense Refill  . amLODipine (NORVASC) 10 MG tablet Take 1 tablet (10 mg total) by mouth daily. 30 tablet 4  . calcitRIOL (ROCALTROL) 0.25 MCG capsule Take 1 capsule (0.25 mcg total) by mouth daily. 30 capsule 3  . doxazosin (CARDURA) 4 MG tablet Take 1 tablet (4 mg total) by mouth at bedtime. 30 tablet 4  . labetalol (NORMODYNE) 300 MG tablet Take 1 tablet (300 mg total) by mouth 2 (two) times daily. 60 tablet 4  . pravastatin (PRAVACHOL) 40 MG tablet TAKE 1 TABLET BY MOUTH DAILY. 30 tablet 3  . [DISCONTINUED] lisinopril (PRINIVIL,ZESTRIL) 40 MG tablet Take 1 tablet (40 mg total) by mouth daily. 30 tablet 1   No current facility-administered medications on file prior to visit.   Family History  Problem Relation Age of Onset  . Colon cancer Neg Hx    Social History    Social History  . Marital Status: Married    Spouse Name: N/A  . Number of Children: N/A  . Years of Education: N/A   Occupational History  . Not on file.   Social History Main Topics  . Smoking status: Never Smoker   . Smokeless tobacco: Never Used  . Alcohol Use: No  . Drug Use: No  . Sexual Activity: Not on file   Other Topics Concern  . Not on file   Social History Narrative    Review of Systems: Other than what is stated in HPI, all other systems are negative.   Objective:   Filed Vitals:   02/22/15 1219  BP: 123/83  Pulse: 68  Temp: 98 F (36.7 C)  Resp: 16    Physical Exam  Constitutional: He is oriented to person, place, and time.  Neck: No JVD present.  Cardiovascular: Normal rate, regular rhythm and normal heart sounds.   Pulmonary/Chest: Effort normal and breath sounds normal.  Musculoskeletal: He exhibits no edema.  Neurological: He is alert and oriented to person, place, and time.  Skin: Rash (right web space betwen middle and ring finger) noted. No erythema.  Psychiatric: He has a normal mood and affect.     Lab Results  Component Value Date   WBC 4.0 09/14/2014   HGB 12.9* 09/14/2014   HCT 38.2* 09/14/2014   MCV 80.1 09/14/2014  PLT 214 09/14/2014   Lab Results  Component Value Date   CREATININE 3.24* 09/14/2014   BUN 20 09/14/2014   NA 140 09/14/2014   K 4.3 09/14/2014   CL 105 09/14/2014   CO2 23 09/14/2014    Lab Results  Component Value Date   HGBA1C 6.0* 09/14/2014   Lipid Panel     Component Value Date/Time   CHOL 171 09/14/2014 1201   TRIG 120 09/14/2014 1201   HDL 34* 09/14/2014 1201   CHOLHDL 5.0 09/14/2014 1201   VLDL 24 09/14/2014 1201   LDLCALC 113* 09/14/2014 1201       Assessment and plan:   Coran was seen today for rash.  Diagnoses and all orders for this visit:  Eczematous dermatitis -     clobetasol cream (TEMOVATE) 0.05 %; Apply 1 application topically 2 (two) times daily. Apply to fingers  only Rash appears to be eczema. Will treat as such, return precautions explained  CKD (chronic kidney disease) stage 4, GFR 15-29 ml/min (HCC) -     calcitRIOL (ROCALTROL) 0.25 MCG capsule; Take 1 capsule (0.25 mcg total) by mouth daily. Stable, continue follow up with Nephrology.   Essential hypertension, benign -     amLODipine (NORVASC) 10 MG tablet; Take 1 tablet (10 mg total) by mouth daily. -     labetalol (NORMODYNE) 300 MG tablet; Take 1 tablet (300 mg total) by mouth 2 (two) times daily. Patient blood pressure is stable and may continue on current medication.  Education on diet, exercise, and modifiable risk factors discussed. Will obtain appropriate labs as needed. Will follow up in 3-6 months.    Return in about 6 months (around 08/23/2015) for Hypertension.       Lance Bosch, [facility] [phone] 02/22/2015, 12:27 PM

## 2015-02-22 NOTE — Progress Notes (Signed)
Patient complains of having a rash between his ring finger and middle finger On his right hand It comes and goes and at times itchy

## 2015-03-14 ENCOUNTER — Other Ambulatory Visit: Payer: Self-pay | Admitting: Internal Medicine

## 2015-03-27 DIAGNOSIS — N2581 Secondary hyperparathyroidism of renal origin: Secondary | ICD-10-CM | POA: Diagnosis not present

## 2015-03-27 DIAGNOSIS — N189 Chronic kidney disease, unspecified: Secondary | ICD-10-CM | POA: Diagnosis not present

## 2015-03-27 DIAGNOSIS — N184 Chronic kidney disease, stage 4 (severe): Secondary | ICD-10-CM | POA: Diagnosis not present

## 2015-04-02 DIAGNOSIS — N2581 Secondary hyperparathyroidism of renal origin: Secondary | ICD-10-CM | POA: Diagnosis not present

## 2015-04-02 DIAGNOSIS — D631 Anemia in chronic kidney disease: Secondary | ICD-10-CM | POA: Diagnosis not present

## 2015-04-02 DIAGNOSIS — N184 Chronic kidney disease, stage 4 (severe): Secondary | ICD-10-CM | POA: Diagnosis not present

## 2015-04-02 DIAGNOSIS — I10 Essential (primary) hypertension: Secondary | ICD-10-CM | POA: Diagnosis not present

## 2015-07-12 DIAGNOSIS — N184 Chronic kidney disease, stage 4 (severe): Secondary | ICD-10-CM | POA: Diagnosis not present

## 2015-07-12 DIAGNOSIS — N189 Chronic kidney disease, unspecified: Secondary | ICD-10-CM | POA: Diagnosis not present

## 2015-07-12 DIAGNOSIS — N2581 Secondary hyperparathyroidism of renal origin: Secondary | ICD-10-CM | POA: Diagnosis not present

## 2015-07-20 DIAGNOSIS — N184 Chronic kidney disease, stage 4 (severe): Secondary | ICD-10-CM | POA: Diagnosis not present

## 2015-07-20 DIAGNOSIS — N2581 Secondary hyperparathyroidism of renal origin: Secondary | ICD-10-CM | POA: Diagnosis not present

## 2015-07-20 DIAGNOSIS — I10 Essential (primary) hypertension: Secondary | ICD-10-CM | POA: Diagnosis not present

## 2015-07-20 DIAGNOSIS — D631 Anemia in chronic kidney disease: Secondary | ICD-10-CM | POA: Diagnosis not present

## 2015-08-29 ENCOUNTER — Other Ambulatory Visit: Payer: Self-pay | Admitting: Internal Medicine

## 2015-08-31 ENCOUNTER — Other Ambulatory Visit: Payer: Self-pay | Admitting: Internal Medicine

## 2015-09-04 DIAGNOSIS — H938X1 Other specified disorders of right ear: Secondary | ICD-10-CM | POA: Diagnosis not present

## 2015-09-04 DIAGNOSIS — H6121 Impacted cerumen, right ear: Secondary | ICD-10-CM | POA: Diagnosis not present

## 2015-09-12 ENCOUNTER — Other Ambulatory Visit: Payer: Self-pay | Admitting: Internal Medicine

## 2015-09-20 ENCOUNTER — Other Ambulatory Visit: Payer: Self-pay | Admitting: Internal Medicine

## 2015-09-26 ENCOUNTER — Other Ambulatory Visit: Payer: Self-pay | Admitting: Internal Medicine

## 2015-10-01 ENCOUNTER — Telehealth: Payer: Self-pay | Admitting: Internal Medicine

## 2015-10-01 MED ORDER — CALCITRIOL 0.25 MCG PO CAPS: 0.2500 ug | ORAL_CAPSULE | Freq: Every day | ORAL | Status: DC

## 2015-10-01 MED ORDER — AMLODIPINE BESYLATE 10 MG PO TABS: 10.0000 mg | ORAL_TABLET | Freq: Every day | ORAL | Status: DC

## 2015-10-01 NOTE — Telephone Encounter (Signed)
Patient called requesting medication refill on calcitRIOL (ROCALTROL) 0.25 MCG capsule , amLODipine (NORVASC) 10 MG tablet. Patient is completely out due to limited PCP appointments he has not been able to get appt. Please f/up with patient.

## 2015-10-01 NOTE — Telephone Encounter (Signed)
Both calcitriol and amlodipine have been refilled.

## 2015-10-12 ENCOUNTER — Other Ambulatory Visit: Payer: Self-pay | Admitting: Internal Medicine

## 2015-11-07 ENCOUNTER — Encounter: Payer: Self-pay | Admitting: Family Medicine

## 2015-11-07 ENCOUNTER — Ambulatory Visit: Payer: Medicare Other | Attending: Family Medicine | Admitting: Family Medicine

## 2015-11-07 VITALS — BP 120/83 | HR 62 | Temp 97.7°F | Ht 76.0 in | Wt 228.6 lb

## 2015-11-07 DIAGNOSIS — E7800 Pure hypercholesterolemia, unspecified: Secondary | ICD-10-CM

## 2015-11-07 DIAGNOSIS — E785 Hyperlipidemia, unspecified: Secondary | ICD-10-CM | POA: Insufficient documentation

## 2015-11-07 DIAGNOSIS — N400 Enlarged prostate without lower urinary tract symptoms: Secondary | ICD-10-CM | POA: Diagnosis not present

## 2015-11-07 DIAGNOSIS — N183 Chronic kidney disease, stage 3 unspecified: Secondary | ICD-10-CM

## 2015-11-07 DIAGNOSIS — Z79899 Other long term (current) drug therapy: Secondary | ICD-10-CM | POA: Insufficient documentation

## 2015-11-07 DIAGNOSIS — R7303 Prediabetes: Secondary | ICD-10-CM | POA: Diagnosis not present

## 2015-11-07 DIAGNOSIS — E559 Vitamin D deficiency, unspecified: Secondary | ICD-10-CM | POA: Diagnosis not present

## 2015-11-07 DIAGNOSIS — N529 Male erectile dysfunction, unspecified: Secondary | ICD-10-CM

## 2015-11-07 DIAGNOSIS — I10 Essential (primary) hypertension: Secondary | ICD-10-CM | POA: Diagnosis not present

## 2015-11-07 DIAGNOSIS — I129 Hypertensive chronic kidney disease with stage 1 through stage 4 chronic kidney disease, or unspecified chronic kidney disease: Secondary | ICD-10-CM | POA: Diagnosis not present

## 2015-11-07 HISTORY — DX: Hyperlipidemia, unspecified: E78.5

## 2015-11-07 LAB — LIPID PANEL
Cholesterol: 157 mg/dL (ref 125–200)
HDL: 43 mg/dL (ref >=40)
LDL CALC: 94 mg/dL (ref <130)
Total CHOL/HDL Ratio: 3.7 Ratio (ref <=5.0)
Triglycerides: 100 mg/dL (ref <150)
VLDL: 20 mg/dL (ref <30)

## 2015-11-07 LAB — COMPLETE METABOLIC PANEL WITH GFR
ALK PHOS: 64 U/L (ref 40–115)
ALT: 13 U/L (ref 9–46)
AST: 17 U/L (ref 10–35)
Albumin: 4.4 g/dL (ref 3.6–5.1)
BILIRUBIN TOTAL: 0.7 mg/dL (ref 0.2–1.2)
BUN: 29 mg/dL — AB (ref 7–25)
CHLORIDE: 105 mmol/L (ref 98–110)
CO2: 25 mmol/L (ref 20–31)
CREATININE: 3.44 mg/dL — AB (ref 0.70–1.25)
Calcium: 9.3 mg/dL (ref 8.6–10.3)
GFR, Est African American: 21 mL/min — ABNORMAL LOW (ref >=60)
GFR, Est Non African American: 18 mL/min — ABNORMAL LOW (ref >=60)
Glucose, Bld: 96 mg/dL (ref 65–99)
Potassium: 3.7 mmol/L (ref 3.5–5.3)
Sodium: 139 mmol/L (ref 135–146)
Total Protein: 7 g/dL (ref 6.1–8.1)

## 2015-11-07 MED ORDER — DOXAZOSIN MESYLATE 4 MG PO TABS: 4.0000 mg | ORAL_TABLET | Freq: Every day | ORAL | Status: DC

## 2015-11-07 MED ORDER — SILDENAFIL CITRATE 50 MG PO TABS: 50.0000 mg | ORAL_TABLET | Freq: Every day | ORAL | Status: DC | PRN

## 2015-11-07 MED ORDER — CLOBETASOL PROPIONATE 0.05 % EX CREA: TOPICAL_CREAM | CUTANEOUS | Status: DC

## 2015-11-07 MED ORDER — CALCITRIOL 0.25 MCG PO CAPS: 0.2500 ug | ORAL_CAPSULE | Freq: Every day | ORAL | Status: DC

## 2015-11-07 MED ORDER — AMLODIPINE BESYLATE 5 MG PO TABS: 5.0000 mg | ORAL_TABLET | Freq: Every day | ORAL | Status: DC

## 2015-11-07 MED ORDER — LABETALOL HCL 300 MG PO TABS: ORAL_TABLET | ORAL | Status: DC

## 2015-11-07 MED ORDER — PRAVASTATIN SODIUM 40 MG PO TABS: 40.0000 mg | ORAL_TABLET | Freq: Every day | ORAL | Status: DC

## 2015-11-07 NOTE — Progress Notes (Signed)
Subjective:  Patient ID: Brent Jimenez, male    DOB: 03/05/1956  Age: 60 y.o. MRN: [id number]  CC: Follow-up and med refills   HPI Brent Jimenez is a 60 year old male with a history of stroke with residual expressive aphasia, hypertension, stage III chronic kidney disease (followed by nephrology), prediabetes, hypercholesterolemia who comes into the clinic today for follow-up visit.  He has been compliant with his antihypertensives and statin and compliant with a low-sodium diet as well as exercises. He states he is very active.  He has a history of a stroke with residual aphasia but denies weakness of any of his extremities and ambulates without an assistive device. He has an upcoming appointment with his nephrologist at [facility] next month.  He complains of erectile dysfunction and requests medications for it.  Past Medical History  Diagnosis Date  . Hypertension   . Stroke ([place])   . Hypercholesteremia   . Brain bleed ([place]) 2007    expressive asphasia  . Chronic kidney disease     Past Surgical History  Procedure Laterality Date  . Brain surgery  2007     Outpatient Prescriptions Prior to Visit  Medication Sig Dispense Refill  . amLODipine (NORVASC) 10 MG tablet Take 1 tablet (10 mg total) by mouth daily. Needs office visit for refills 30 tablet 0  . calcitRIOL (ROCALTROL) 0.25 MCG capsule Take 1 capsule (0.25 mcg total) by mouth daily. Needs office visit for refills 30 capsule 0  . clobetasol cream (TEMOVATE) [id number] % APPLY 1 APPLICATION TOPICALLY 2 TIMES DAILY. APPLY TO FINGERS ONLY 45 g 0  . doxazosin (CARDURA) 4 MG tablet Take 1 tablet (4 mg total) by mouth at bedtime. (Patient taking differently: Take 8 mg by mouth at bedtime. ) 30 tablet 4  . labetalol (NORMODYNE) 300 MG tablet TAKE 1 TABLET BY MOUTH 2 TIMES DAILY. MUST HAVE OFFICE VISIT FOR REFILLS 60 tablet 0  . pravastatin (PRAVACHOL) 40 MG tablet TAKE 1 TABLET BY MOUTH DAILY. 30 tablet 3   No  facility-administered medications prior to visit.    ROS Review of Systems  Constitutional: Negative for activity change and appetite change.  HENT: Negative for sinus pressure and sore throat.   Eyes: Negative for visual disturbance.  Respiratory: Negative for cough, chest tightness and shortness of breath.   Cardiovascular: Negative for chest pain and leg swelling.  Gastrointestinal: Negative for abdominal pain, diarrhea, constipation and abdominal distention.  Endocrine: Negative.   Genitourinary: Negative for dysuria.  Musculoskeletal: Negative for myalgias and joint swelling.  Skin: Negative for rash.  Allergic/Immunologic: Negative.   Neurological: Negative for weakness, light-headedness and numbness.  Psychiatric/Behavioral: Negative for suicidal ideas and dysphoric mood.    Objective:  BP 120/83 mmHg  Pulse 62  Temp(Src) 97.7 F (36.5 C) (Oral)  Ht 6\' 4"  (1.93 m)  Wt 228 lb 9.6 oz (103.692 kg)  BMI 27.84 kg/m2  SpO2 99%  BP/Weight 11/07/2015 [id number]  Systolic BP 123456 [id number]  Diastolic BP 83 83 92  Wt. (Lbs) 228.6 236 237  BMI 27.84 28.74 28.86      Physical Exam  Constitutional: He is oriented to person, place, and time. He appears well-developed and well-nourished.  Cardiovascular: Normal rate, normal heart sounds and intact distal pulses.   No murmur heard. Pulmonary/Chest: Effort normal and breath sounds normal. He has no wheezes. He has no rales. He exhibits no tenderness.  Abdominal: Soft. Bowel sounds are normal. He exhibits no distension and no mass.  There is no tenderness.  Musculoskeletal: Normal range of motion.  Neurological: He is alert and oriented to person, place, and time.  Skin: Skin is warm and dry.  Psychiatric: He has a normal mood and affect.    CMP Latest Ref Rng 09/14/2014 12/22/2013 06/15/2012  Glucose 70 - 99 mg/dL 89 99 94  BUN 6 - 23 mg/dL 20 23 28(H)  Creatinine 0.50 - 1.35 mg/dL 3.24(H) 3.27(H) 3.30(H)  Sodium 135 - 145  mEq/L 140 140 140  Potassium 3.5 - 5.3 mEq/L 4.3 4.0 4.9  Chloride 96 - 112 mEq/L 105 107 103  CO2 19 - 32 mEq/L 23 24 26   Calcium 8.4 - 10.5 mg/dL 9.3 9.4 10.0  Total Protein 6.0 - 8.3 g/dL 7.3 7.2 8.1  Total Bilirubin 0.2 - 1.2 mg/dL 0.8 0.7 0.6  Alkaline Phos 39 - 117 U/L 62 65 83  AST 0 - 37 U/L 18 17 21   ALT 0 - 53 U/L 13 14 19      Lipid Panel     Component Value Date/Time   CHOL 171 09/14/2014 1201   TRIG 120 09/14/2014 1201   HDL 34* 09/14/2014 1201   CHOLHDL 5.0 09/14/2014 1201   VLDL 24 09/14/2014 1201   LDLCALC 113* 09/14/2014 1201   LDLDIRECT 97 10/23/2009 2324     Assessment & Plan:   1. BPH (benign prostatic hyperplasia) - doxazosin (CARDURA) 4 MG tablet; Take 1 tablet (4 mg total) by mouth at bedtime.  Dispense: 90 tablet; Refill: 1  2. Essential hypertension, benign Blood pressure is on the soft side Reduce amlodipine from 10 mg to 5 mg - amLODipine (NORVASC) 5 MG tablet; Take 1 tablet (5 mg total) by mouth daily. Needs office visit for refills  Dispense: 90 tablet; Refill: 1 - labetalol (NORMODYNE) 300 MG tablet; TAKE 1 TABLET BY MOUTH 2 TIMES DAILY. MUST HAVE OFFICE VISIT FOR REFILLS  Dispense: 180 tablet; Refill: 1 - COMPLETE METABOLIC PANEL WITH GFR  3. Vitamin D deficiency  4. Chronic kidney disease, stage 3 (moderate) Avoid nephrotoxins Managed by [facility] - calcitRIOL (ROCALTROL) 0.25 MCG capsule; Take 1 capsule (0.25 mcg total) by mouth daily. Needs office visit for refills  Dispense: 90 capsule; Refill: 1 - VITAMIN D 25 Hydroxy (Vit-D Deficiency, Fractures)  5. Prediabetes - HgB A1c  6. Erectile dysfunction, unspecified erectile dysfunction type - sildenafil (VIAGRA) 50 MG tablet; Take 1 tablet (50 mg total) by mouth daily as needed for erectile dysfunction.  Dispense: 10 tablet; Refill: 0  7. Hypercholesteremia Controlled - pravastatin (PRAVACHOL) 40 MG tablet; Take 1 tablet (40 mg total) by mouth daily.  Dispense: 90  tablet; Refill: 1 - Lipid panel   Meds ordered this encounter  Medications  . amLODipine (NORVASC) 5 MG tablet    Sig: Take 1 tablet (5 mg total) by mouth daily. Needs office visit for refills    Dispense:  90 tablet    Refill:  1  . calcitRIOL (ROCALTROL) 0.25 MCG capsule    Sig: Take 1 capsule (0.25 mcg total) by mouth daily. Needs office visit for refills    Dispense:  90 capsule    Refill:  1  . clobetasol cream (TEMOVATE) 0.05 %    Sig: APPLY 1 APPLICATION TOPICALLY 2 TIMES DAILY. APPLY TO FINGERS ONLY    Dispense:  45 g    Refill:  0  . doxazosin (CARDURA) 4 MG tablet    Sig: Take 1 tablet (4 mg total) by mouth at  bedtime.    Dispense:  90 tablet    Refill:  1  . labetalol (NORMODYNE) 300 MG tablet    Sig: TAKE 1 TABLET BY MOUTH 2 TIMES DAILY. MUST HAVE OFFICE VISIT FOR REFILLS    Dispense:  180 tablet    Refill:  1  . pravastatin (PRAVACHOL) 40 MG tablet    Sig: Take 1 tablet (40 mg total) by mouth daily.    Dispense:  90 tablet    Refill:  1  . sildenafil (VIAGRA) 50 MG tablet    Sig: Take 1 tablet (50 mg total) by mouth daily as needed for erectile dysfunction.    Dispense:  10 tablet    Refill:  0    Follow-up: Return in about 3 months (around 02/07/2016) for For follow-up of hypertension.   Arnoldo Morale MD

## 2015-11-07 NOTE — Patient Instructions (Signed)
Hypertension Hypertension, commonly called high blood pressure, is when the force of blood pumping through your arteries is too strong. Your arteries are the blood vessels that carry blood from your heart throughout your body. A blood pressure reading consists of a higher number over a lower number, such as 110/72. The higher number (systolic) is the pressure inside your arteries when your heart pumps. The lower number (diastolic) is the pressure inside your arteries when your heart relaxes. Ideally you want your blood pressure below 120/80. Hypertension forces your heart to work harder to pump blood. Your arteries may become narrow or stiff. Having untreated or uncontrolled hypertension can cause heart attack, stroke, kidney disease, and other problems. RISK FACTORS Some risk factors for high blood pressure are controllable. Others are not.  Risk factors you cannot control include:   Race. You may be at higher risk if you are African American.  Age. Risk increases with age.  Gender. Men are at higher risk than women before age 45 years. After age 65, women are at higher risk than men. Risk factors you can control include:  Not getting enough exercise or physical activity.  Being overweight.  Getting too much fat, sugar, calories, or salt in your diet.  Drinking too much alcohol. SIGNS AND SYMPTOMS Hypertension does not usually cause signs or symptoms. Extremely high blood pressure (hypertensive crisis) may cause headache, anxiety, shortness of breath, and nosebleed. DIAGNOSIS To check if you have hypertension, your health care provider will measure your blood pressure while you are seated, with your arm held at the level of your heart. It should be measured at least twice using the same arm. Certain conditions can cause a difference in blood pressure between your right and left arms. A blood pressure reading that is higher than normal on one occasion does not mean that you need treatment. If  it is not clear whether you have high blood pressure, you may be asked to return on a different day to have your blood pressure checked again. Or, you may be asked to monitor your blood pressure at home for 1 or more weeks. TREATMENT Treating high blood pressure includes making lifestyle changes and possibly taking medicine. Living a healthy lifestyle can help lower high blood pressure. You may need to change some of your habits. Lifestyle changes may include:  Following the DASH diet. This diet is high in fruits, vegetables, and whole grains. It is low in salt, red meat, and added sugars.  Keep your sodium intake below 2,300 mg per day.  Getting at least 30-45 minutes of aerobic exercise at least 4 times per week.  Losing weight if necessary.  Not smoking.  Limiting alcoholic beverages.  Learning ways to reduce stress. Your health care provider may prescribe medicine if lifestyle changes are not enough to get your blood pressure under control, and if one of the following is true:  You are 18-59 years of age and your systolic blood pressure is above 140.  You are 60 years of age or older, and your systolic blood pressure is above 150.  Your diastolic blood pressure is above 90.  You have diabetes, and your systolic blood pressure is over 140 or your diastolic blood pressure is over 90.  You have kidney disease and your blood pressure is above 140/90.  You have heart disease and your blood pressure is above 140/90. Your personal target blood pressure may vary depending on your medical conditions, your age, and other factors. HOME CARE INSTRUCTIONS    Have your blood pressure rechecked as directed by your health care provider.   Take medicines only as directed by your health care provider. Follow the directions carefully. Blood pressure medicines must be taken as prescribed. The medicine does not work as well when you skip doses. Skipping doses also puts you at risk for  problems.  Do not smoke.   Monitor your blood pressure at home as directed by your health care provider. SEEK MEDICAL CARE IF:   You think you are having a reaction to medicines taken.  You have recurrent headaches or feel dizzy.  You have swelling in your ankles.  You have trouble with your vision. SEEK IMMEDIATE MEDICAL CARE IF:  You develop a severe headache or confusion.  You have unusual weakness, numbness, or feel faint.  You have severe chest or abdominal pain.  You vomit repeatedly.  You have trouble breathing. MAKE SURE YOU:   Understand these instructions.  Will watch your condition.  Will get help right away if you are not doing well or get worse.   This information is not intended to replace advice given to you by your health care provider. Make sure you discuss any questions you have with your health care provider.   Document Released: 04/28/2005 Document Revised: 09/12/2014 Document Reviewed: 02/18/2013 Elsevier Interactive Patient Education 2016 [employer]vier Inc[employer]  

## 2015-11-08 LAB — VITAMIN D 25 HYDROXY (VIT D DEFICIENCY, FRACTURES): VIT D 25 HYDROXY: 40 ng/mL (ref 30–100)

## 2015-11-22 ENCOUNTER — Telehealth: Payer: Self-pay

## 2015-11-22 NOTE — Telephone Encounter (Signed)
-----   Message from Arnoldo Morale, MD sent at 11/08/2015  2:06 PM EDT ----- Cholesterol is normal, vitamin D is normal. Kidney function test still shows a decline and he will need to keep his appointment with his nephrologist.

## 2015-11-30 DIAGNOSIS — N189 Chronic kidney disease, unspecified: Secondary | ICD-10-CM | POA: Diagnosis not present

## 2015-11-30 DIAGNOSIS — N184 Chronic kidney disease, stage 4 (severe): Secondary | ICD-10-CM | POA: Diagnosis not present

## 2015-11-30 DIAGNOSIS — N2581 Secondary hyperparathyroidism of renal origin: Secondary | ICD-10-CM | POA: Diagnosis not present

## 2015-12-04 DIAGNOSIS — N2581 Secondary hyperparathyroidism of renal origin: Secondary | ICD-10-CM | POA: Diagnosis not present

## 2015-12-04 DIAGNOSIS — I10 Essential (primary) hypertension: Secondary | ICD-10-CM | POA: Diagnosis not present

## 2015-12-04 DIAGNOSIS — D631 Anemia in chronic kidney disease: Secondary | ICD-10-CM | POA: Diagnosis not present

## 2015-12-04 DIAGNOSIS — Z6827 Body mass index (BMI) 27.0-27.9, adult: Secondary | ICD-10-CM | POA: Diagnosis not present

## 2015-12-04 DIAGNOSIS — N184 Chronic kidney disease, stage 4 (severe): Secondary | ICD-10-CM | POA: Diagnosis not present

## 2015-12-10 ENCOUNTER — Telehealth: Payer: Self-pay | Admitting: Family Medicine

## 2015-12-10 NOTE — Telephone Encounter (Signed)
Pt. Wife called stating that pt. BP is high and that he needs to have his medication adjusted. Please f/u

## 2015-12-11 NOTE — Telephone Encounter (Signed)
Writer called patient back regarding his medication and explained that he would need an appt with Dr. Jarold Song.  Patient's wife stated that patient saw his kidney doctor today and his medications were adjusted.

## 2016-02-27 ENCOUNTER — Other Ambulatory Visit: Payer: Self-pay | Admitting: Family Medicine

## 2016-02-27 DIAGNOSIS — N400 Enlarged prostate without lower urinary tract symptoms: Secondary | ICD-10-CM

## 2016-03-31 DIAGNOSIS — N2581 Secondary hyperparathyroidism of renal origin: Secondary | ICD-10-CM | POA: Diagnosis not present

## 2016-03-31 DIAGNOSIS — N184 Chronic kidney disease, stage 4 (severe): Secondary | ICD-10-CM | POA: Diagnosis not present

## 2016-03-31 DIAGNOSIS — N189 Chronic kidney disease, unspecified: Secondary | ICD-10-CM | POA: Diagnosis not present

## 2016-04-07 DIAGNOSIS — N184 Chronic kidney disease, stage 4 (severe): Secondary | ICD-10-CM | POA: Diagnosis not present

## 2016-04-07 DIAGNOSIS — D631 Anemia in chronic kidney disease: Secondary | ICD-10-CM | POA: Diagnosis not present

## 2016-04-07 DIAGNOSIS — I129 Hypertensive chronic kidney disease with stage 1 through stage 4 chronic kidney disease, or unspecified chronic kidney disease: Secondary | ICD-10-CM | POA: Diagnosis not present

## 2016-04-07 DIAGNOSIS — N2581 Secondary hyperparathyroidism of renal origin: Secondary | ICD-10-CM | POA: Diagnosis not present

## 2016-05-02 ENCOUNTER — Other Ambulatory Visit: Payer: Self-pay | Admitting: Family Medicine

## 2016-05-02 DIAGNOSIS — E7800 Pure hypercholesterolemia, unspecified: Secondary | ICD-10-CM

## 2016-05-13 ENCOUNTER — Other Ambulatory Visit: Payer: Self-pay | Admitting: Family Medicine

## 2016-05-13 DIAGNOSIS — E7800 Pure hypercholesterolemia, unspecified: Secondary | ICD-10-CM

## 2016-05-13 DIAGNOSIS — I10 Essential (primary) hypertension: Secondary | ICD-10-CM

## 2016-06-16 ENCOUNTER — Ambulatory Visit: Payer: Medicare Other | Attending: Physician Assistant | Admitting: Physician Assistant

## 2016-06-16 ENCOUNTER — Encounter: Payer: Self-pay | Admitting: Physician Assistant

## 2016-06-16 VITALS — BP 104/67 | HR 68 | Temp 98.0°F | Resp 20 | Wt 225.0 lb

## 2016-06-16 DIAGNOSIS — R42 Dizziness and giddiness: Secondary | ICD-10-CM | POA: Diagnosis not present

## 2016-06-16 DIAGNOSIS — Z8673 Personal history of transient ischemic attack (TIA), and cerebral infarction without residual deficits: Secondary | ICD-10-CM | POA: Diagnosis not present

## 2016-06-16 DIAGNOSIS — J069 Acute upper respiratory infection, unspecified: Secondary | ICD-10-CM | POA: Insufficient documentation

## 2016-06-16 DIAGNOSIS — E7800 Pure hypercholesterolemia, unspecified: Secondary | ICD-10-CM | POA: Insufficient documentation

## 2016-06-16 DIAGNOSIS — I951 Orthostatic hypotension: Secondary | ICD-10-CM | POA: Diagnosis not present

## 2016-06-16 DIAGNOSIS — N189 Chronic kidney disease, unspecified: Secondary | ICD-10-CM | POA: Insufficient documentation

## 2016-06-16 DIAGNOSIS — I129 Hypertensive chronic kidney disease with stage 1 through stage 4 chronic kidney disease, or unspecified chronic kidney disease: Secondary | ICD-10-CM | POA: Diagnosis not present

## 2016-06-16 DIAGNOSIS — Z79899 Other long term (current) drug therapy: Secondary | ICD-10-CM | POA: Diagnosis not present

## 2016-06-16 MED ORDER — DEXTROMETHORPHAN-GUAIFENESIN 10-200 MG PO CAPS: 2.0000 | ORAL_CAPSULE | Freq: Four times a day (QID) | ORAL | 0 refills | Status: AC

## 2016-06-16 MED ORDER — SALINE SPRAY 0.65 % NA SOLN: 1.0000 | NASAL | 0 refills | Status: DC | PRN

## 2016-06-16 NOTE — Progress Notes (Signed)
Patient ID: Brent Jimenez, male   DOB: [birth year], 62 y.o.   MRN: [id number]    Subjective:  Patient ID: Brent Jimenez, male    DOB: [birth year]  Age: 61 y.o. MRN: [id number]  CC: dizziness when standing  HPI Brent Jimenez is a 61 y.o. male with a PMH of   Past Medical History:  Diagnosis Date  . Brain bleed ([place]) 2007   expressive asphasia  . Chronic kidney disease   . Hypercholesteremia   . Hypertension   . Stroke [facility])     that presents with a 5 day history of dizziness upon standing and dry lips/mouth. There is also complaint of a mild cough. Patient is accompanied by wife and she contributes to the history of present illness. Patient's wife states that she was ill one week ago with upper respiratory symptoms. She believes patient may have contracted the virus and is causing his current symptoms. Patient has not taken anything for relief. Patient denies feeling ill, fatigued, or with body aches. Has taken his flu vaccine approximately 2 months ago. Patient denies chest pain, palpitations, shortness of breath, headache, visual disturbances, abdominal pain, fever, chills, nausea, vomiting, rash, or GI/GU symptoms. Patient reports drinking less than 16 ounces of water per day.    Outpatient Medications Prior to Visit  Medication Sig Dispense Refill  . amLODipine (NORVASC) 5 MG tablet Take 1 tablet (5 mg total) by mouth daily. Needs office visit for refills 90 tablet 1  . calcitRIOL (ROCALTROL) 0.25 MCG capsule Take 1 capsule (0.25 mcg total) by mouth daily. Needs office visit for refills 90 capsule 1  . doxazosin (CARDURA) 4 MG tablet TAKE 1 TABLET BY MOUTH DAILY AT BEDTIME 90 tablet 0  . labetalol (NORMODYNE) 300 MG tablet TAKE 1 TABLET BY MOUTH 2 TIMES DAILY. MUST HAVE OFFICE VISIT FOR REFILLS 180 tablet 1  . pravastatin (PRAVACHOL) 40 MG tablet TAKE 1 TABLET BY MOUTH ONCE A DAY 90 tablet 0  . clobetasol cream (TEMOVATE) 6.96 % APPLY 1 APPLICATION TOPICALLY 2 TIMES DAILY. APPLY TO  FINGERS ONLY (Patient not taking: Reported on 06/16/2016) 45 g 0  . sildenafil (VIAGRA) 50 MG tablet Take 1 tablet (50 mg total) by mouth daily as needed for erectile dysfunction. (Patient not taking: Reported on 06/16/2016) 10 tablet 0   No facility-administered medications prior to visit.      ROS Review of Systems  Constitutional: Negative for chills, fever and malaise/fatigue.  HENT: Negative for ear pain and sinus pain.   Eyes: Negative for pain.  Respiratory: Positive for cough.   Cardiovascular: Negative for chest pain.  Gastrointestinal: Negative for abdominal pain, nausea and vomiting.  Musculoskeletal: Negative for neck pain.  Skin: Negative for rash.  Neurological: Positive for dizziness. Negative for weakness.    Objective:  BP 104/67 (BP Location: Right Arm, Patient Position: Sitting, Cuff Size: Large)   Pulse 68   Temp 98 F (36.7 C) (Oral)   Resp 20   Wt 225 lb (102.1 kg)   SpO2 99%   BMI 27.39 kg/m   BP/Weight 06/16/2016 11/07/2015 78/93/8101  Systolic BP 751 025 852  Diastolic BP 67 83 83  Wt. (Lbs) 225 228.6 236  BMI 27.39 27.84 28.74      Physical Exam  Constitutional: He is oriented to person, place, and time. He appears well-developed and well-nourished.  HENT:  Head: Normocephalic and atraumatic.  Eyes: No scleral icterus.  Cardiovascular: Normal rate, regular rhythm and normal heart sounds.   Pulmonary/Chest: No  respiratory distress. He has no wheezes. He has no rales.  Abdominal: Soft. There is no tenderness.  Musculoskeletal: He exhibits no edema.  Neurological: He is alert and oriented to person, place, and time.  Mild aphasia  Skin: Skin is warm and dry. No rash noted. He is not diaphoretic.  Lips appear dry and mildly chapped.  Psychiatric: He has a normal mood and affect. His behavior is normal.  Tangential thought process at times during the encounter     Assessment & Plan:   1. Upper respiratory tract infection, unspecified type -  Dextromethorphan-Guaifenesin (CORICIDIN HBP CONGESTION/COUGH) 10-200 MG CAPS; Take 2 capsules by mouth every 6 (six) hours.  Dispense: 1 each; Refill: 0 (DRINK MORE WATER) - sodium chloride (OCEAN) 0.65 % SOLN nasal spray; Place 1 spray into both nostrils as needed for congestion.  Dispense: 1 Bottle; Refill: 0  2. Orthostatic hypotension -Dehydration may likely be the cause/contributor to orthostatic hypotension. Patient drinks less than 16 ounces of water per day. Advised to drink 8-10 cups of water per day. Will consider further testing and/or tilt table testing if symptoms persist or worsen.  Meds ordered this encounter  Medications  . Dextromethorphan-Guaifenesin (CORICIDIN HBP CONGESTION/COUGH) 10-200 MG CAPS    Sig: Take 2 capsules by mouth every 6 (six) hours.    Dispense:  1 each    Refill:  0    Order Specific Question:   Supervising Provider    Answer:   Tresa Garter [id number]  . sodium chloride (OCEAN) 0.65 % SOLN nasal spray    Sig: Place 1 spray into both nostrils as needed for congestion.    Dispense:  1 Bottle    Refill:  0    Order Specific Question:   Supervising Provider    Answer:   Tresa Garter [[id number]]    Follow-up: Return in about 4 weeks (around 07/14/2016) for yearly physical.   Clent Demark PA

## 2016-06-16 NOTE — Progress Notes (Signed)
Exposure to URI's in household: light headed, weakness.  difficulties standing Coughing, runny nose.

## 2016-06-16 NOTE — Patient Instructions (Signed)
Please make sure that you drink 5-6 16oz bottles of water per day during the time you are sick. I believe you may be mildly dehydrated. Dehydration combined with Labetalol use is likely what may be causing your "dizziness".   Orthostatic Hypotension Orthostatic hypotension is a sudden drop in blood pressure that happens when you quickly change positions, such as when you get up from a seated or lying position. Blood pressure is a measurement of how strongly, or weakly, your blood is pressing against the walls of your arteries. Arteries are blood vessels that carry blood from your heart throughout your body. When blood pressure is too low, you may not get enough blood to your brain or to the rest of your organs. This can cause weakness, light-headedness, rapid heartbeat, and fainting. This can last for just a few seconds or for up to a few minutes. Orthostatic hypotension is usually not a serious problem. However, if it happens frequently or gets worse, it may be a sign of something more serious. What are the causes? This condition may be caused by:  Sudden changes in posture, such as standing up quickly after you have been sitting or lying down.  Blood loss.  Loss of body fluids (dehydration).  Heart problems.  Hormone (endocrine) problems.  Pregnancy.  Severe infection.  Lack of certain nutrients.  Severe allergic reactions (anaphylaxis).  Certain medicines, such as blood pressure medicine or medicines that make the body lose excess fluids (diuretics). Sometimes, this condition can be caused by not taking medicine as directed, such as taking too much of a certain medicine. What increases the risk? Certain factors can make you more likely to develop orthostatic hypotension, including:  Age. Risk increases as you get older.  Conditions that affect the heart or the central nervous system.  Taking certain medicines, such as blood pressure medicine or diuretics.  Being pregnant. What  are the signs or symptoms? Symptoms of this condition may include:  Weakness.  Light-headedness.  Dizziness.  Blurred vision.  Fatigue.  Rapid heartbeat.  Fainting, in severe cases. How is this diagnosed? This condition is diagnosed based on:  Your medical history.  Your symptoms.  Your blood pressure measurement. Your health care provider will check your blood pressure when you are:  Lying down.  Sitting.  Standing. A blood pressure reading is recorded as two numbers, such as "120 over 80" (or 120/80). The first ("top") number is called the systolic pressure. It is a measure of the pressure in your arteries as your heart beats. The second ("bottom") number is called the diastolic pressure. It is a measure of the pressure in your arteries when your heart relaxes between beats. Blood pressure is measured in a unit called mm Hg. Healthy blood pressure for adults is 120/80. If your blood pressure is below 90/60, you may be diagnosed with hypotension. Other information or tests that may be used to diagnose orthostatic hypotension include:  Your other vital signs, such as your heart rate and temperature.  Blood tests.  Tilt table test. For this test, you will be safely secured to a table that moves you from a lying position to an upright position. Your heart rhythm and blood pressure will be monitored during the test. How is this treated? Treatment for this condition may include:  Changing your diet. This may involve eating more salt (sodium) or drinking more water.  Taking medicines to raise your blood pressure.  Changing the dosage of certain medicines you are taking that might  be lowering your blood pressure.  Wearing compression stockings. These stockings help to prevent blood clots and reduce swelling in your legs. In some cases, you may need to go to the hospital for:  Fluid replacement. This means you will receive fluids through an IV tube.  Blood replacement.  This means you will receive donated blood through an IV tube (transfusion).  Treating an infection or heart problems, if this applies.  Monitoring. You may need to be monitored while medicines that you are taking wear off. Follow these instructions at home: Eating and drinking  Drink enough fluid to keep your urine clear or pale yellow.  Eat a healthy diet and follow instructions from your health care provider about eating or drinking restrictions. A healthy diet includes:  Fresh fruits and vegetables.  Whole grains.  Lean meats.  Low-fat dairy products.  Eat extra salt only as directed. Do not add extra salt to your diet unless your health care provider told you to do that.  Eat frequent, small meals.  Avoid standing up suddenly after eating. Medicines  Take over-the-counter and prescription medicines only as told by your health care provider.  Follow instructions from your health care provider about changing the dosage of your current medicines, if this applies.  Do not stop or adjust any of your medicines on your own. General instructions  Wear compression stockings as told by your health care provider.  Get up slowly from lying down or sitting positions. This gives your blood pressure a chance to adjust.  Avoid hot showers and excessive heat as directed by your health care provider.  Return to your normal activities as told by your health care provider. Ask your health care provider what activities are safe for you.  Do not use any products that contain nicotine or tobacco, such as cigarettes and e-cigarettes. If you need help quitting, ask your health care provider.  Keep all follow-up visits as told by your health care provider. This is important. Contact a health care provider if:  You vomit.  You have diarrhea.  You have a fever for more than 2-3 days.  You feel more thirsty than usual.  You feel weak and tired. Get help right away if:  You have chest  pain.  You have a fast or irregular heartbeat.  You develop numbness in any part of your body.  You cannot move your arms or your legs.  You have trouble speaking.  You become sweaty or feel lightheaded.  You faint.  You feel short of breath.  You have trouble staying awake.  You feel confused. This information is not intended to replace advice given to you by your health care provider. Make sure you discuss any questions you have with your health care provider. Document Released: 04/18/2002 Document Revised: 01/15/2016 Document Reviewed: 10/19/2015 Elsevier Interactive Patient Education  2017 [employer].

## 2016-07-30 ENCOUNTER — Other Ambulatory Visit: Payer: Self-pay | Admitting: Family Medicine

## 2016-07-30 DIAGNOSIS — E7800 Pure hypercholesterolemia, unspecified: Secondary | ICD-10-CM
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
## 2016-07-31 DIAGNOSIS — N184 Chronic kidney disease, stage 4 (severe): Secondary | ICD-10-CM | POA: Diagnosis not present

## 2016-07-31 DIAGNOSIS — N2581 Secondary hyperparathyroidism of renal origin: Secondary | ICD-10-CM | POA: Diagnosis not present

## 2016-07-31 DIAGNOSIS — N189 Chronic kidney disease, unspecified: Secondary | ICD-10-CM | POA: Diagnosis not present

## 2016-08-07 DIAGNOSIS — D631 Anemia in chronic kidney disease: Secondary | ICD-10-CM | POA: Diagnosis not present

## 2016-08-07 DIAGNOSIS — N184 Chronic kidney disease, stage 4 (severe): Secondary | ICD-10-CM | POA: Diagnosis not present

## 2016-08-07 DIAGNOSIS — I129 Hypertensive chronic kidney disease with stage 1 through stage 4 chronic kidney disease, or unspecified chronic kidney disease: Secondary | ICD-10-CM | POA: Diagnosis not present

## 2016-08-07 DIAGNOSIS — N2581 Secondary hyperparathyroidism of renal origin: Secondary | ICD-10-CM | POA: Diagnosis not present

## 2016-10-28 ENCOUNTER — Other Ambulatory Visit: Payer: Self-pay | Admitting: Family Medicine

## 2016-10-28 DIAGNOSIS — E7800 Pure hypercholesterolemia, unspecified: Secondary | ICD-10-CM

## 2016-12-25 DIAGNOSIS — N184 Chronic kidney disease, stage 4 (severe): Secondary | ICD-10-CM | POA: Diagnosis not present

## 2016-12-25 DIAGNOSIS — N2581 Secondary hyperparathyroidism of renal origin: Secondary | ICD-10-CM | POA: Diagnosis not present

## 2016-12-25 DIAGNOSIS — N189 Chronic kidney disease, unspecified: Secondary | ICD-10-CM | POA: Diagnosis not present

## 2017-01-01 DIAGNOSIS — D631 Anemia in chronic kidney disease: Secondary | ICD-10-CM | POA: Diagnosis not present

## 2017-01-01 DIAGNOSIS — N184 Chronic kidney disease, stage 4 (severe): Secondary | ICD-10-CM | POA: Diagnosis not present

## 2017-01-01 DIAGNOSIS — N2581 Secondary hyperparathyroidism of renal origin: Secondary | ICD-10-CM | POA: Diagnosis not present

## 2017-01-01 DIAGNOSIS — I129 Hypertensive chronic kidney disease with stage 1 through stage 4 chronic kidney disease, or unspecified chronic kidney disease: Secondary | ICD-10-CM | POA: Diagnosis not present

## 2017-01-28 ENCOUNTER — Other Ambulatory Visit: Payer: Self-pay | Admitting: Family Medicine

## 2017-01-28 DIAGNOSIS — N183 Chronic kidney disease, stage 3 unspecified: Secondary | ICD-10-CM

## 2017-01-28 DIAGNOSIS — E7800 Pure hypercholesterolemia, unspecified: Secondary | ICD-10-CM

## 2017-05-13 ENCOUNTER — Ambulatory Visit: Payer: Medicare Other | Attending: Family Medicine | Admitting: Family Medicine

## 2017-05-13 ENCOUNTER — Encounter: Payer: Self-pay | Admitting: Family Medicine

## 2017-05-13 VITALS — BP 136/88 | HR 71 | Temp 97.6°F | Ht 76.0 in | Wt 230.0 lb

## 2017-05-13 DIAGNOSIS — N184 Chronic kidney disease, stage 4 (severe): Secondary | ICD-10-CM | POA: Diagnosis not present

## 2017-05-13 DIAGNOSIS — I129 Hypertensive chronic kidney disease with stage 1 through stage 4 chronic kidney disease, or unspecified chronic kidney disease: Secondary | ICD-10-CM | POA: Insufficient documentation

## 2017-05-13 DIAGNOSIS — Z8673 Personal history of transient ischemic attack (TIA), and cerebral infarction without residual deficits: Secondary | ICD-10-CM | POA: Insufficient documentation

## 2017-05-13 DIAGNOSIS — E7800 Pure hypercholesterolemia, unspecified: Secondary | ICD-10-CM | POA: Insufficient documentation

## 2017-05-13 DIAGNOSIS — R7303 Prediabetes: Secondary | ICD-10-CM

## 2017-05-13 DIAGNOSIS — Z9889 Other specified postprocedural states: Secondary | ICD-10-CM | POA: Diagnosis not present

## 2017-05-13 DIAGNOSIS — N183 Chronic kidney disease, stage 3 unspecified: Secondary | ICD-10-CM

## 2017-05-13 DIAGNOSIS — I10 Essential (primary) hypertension: Secondary | ICD-10-CM | POA: Diagnosis not present

## 2017-05-13 DIAGNOSIS — Z79899 Other long term (current) drug therapy: Secondary | ICD-10-CM | POA: Diagnosis not present

## 2017-05-13 DIAGNOSIS — R05 Cough: Secondary | ICD-10-CM | POA: Diagnosis not present

## 2017-05-13 DIAGNOSIS — Z23 Encounter for immunization: Secondary | ICD-10-CM

## 2017-05-13 LAB — POCT GLYCOSYLATED HEMOGLOBIN (HGB A1C): HEMOGLOBIN A1C: 6

## 2017-05-13 MED ORDER — BENZONATATE 100 MG PO CAPS: 100.0000 mg | ORAL_CAPSULE | Freq: Two times a day (BID) | ORAL | 0 refills | Status: DC | PRN

## 2017-05-13 MED ORDER — CETIRIZINE HCL 10 MG PO TABS: 10.0000 mg | ORAL_TABLET | Freq: Every day | ORAL | 1 refills | Status: DC

## 2017-05-13 MED ORDER — LABETALOL HCL 300 MG PO TABS: ORAL_TABLET | ORAL | 1 refills | Status: DC

## 2017-05-13 NOTE — Patient Instructions (Signed)
Prediabetes Prediabetes is the condition of having a blood sugar (blood glucose) level that is higher than it should be, but not high enough for you to be diagnosed with type 2 diabetes. Having prediabetes puts you at risk for developing type 2 diabetes (type 2 diabetes mellitus). Prediabetes may be called impaired glucose tolerance or impaired fasting glucose. Prediabetes usually does not cause symptoms. Your health care provider can diagnose this condition with blood tests. You may be tested for prediabetes if you are overweight and if you have at least one other risk factor for prediabetes. Risk factors for prediabetes include:  Having a family member with type 2 diabetes.  Being overweight or obese.  Being older than age 57.  Being of American-Indian, African-American, Hispanic/Latino, or Asian/Pacific Islander descent.  Having an inactive (sedentary) lifestyle.  Having a history of gestational diabetes or polycystic ovarian syndrome (PCOS).  Having low levels of good cholesterol (HDL-C) or high levels of blood fats (triglycerides).  Having high blood pressure.  What is blood glucose and how is blood glucose measured?  Blood glucose refers to the amount of glucose in your bloodstream. Glucose comes from eating foods that contain sugars and starches (carbohydrates) that the body breaks down into glucose. Your blood glucose level may be measured in mg/dL (milligrams per deciliter) or mmol/L (millimoles per liter).Your blood glucose may be checked with one or more of the following blood tests:  A fasting blood glucose (FBG) test. You will not be allowed to eat (you will fast) for at least 8 hours before a blood sample is taken. ? A normal range for FBG is 70-100 mg/dl (3.9-5.6 mmol/L).  An A1c (hemoglobin A1c) blood test. This test provides information about blood glucose control over the previous 2?68month.  An oral glucose tolerance test (OGTT). This test measures your blood  glucose twice: ? After fasting. This is your baseline level. ? Two hours after you drink a beverage that contains glucose.  You may be diagnosed with prediabetes:  If your FBG is 100?125 mg/dL (5.6-6.9 mmol/L).  If your A1c level is 5.7?6.4%.  If your OGGT result is 140?199 mg/dL (7.8-11 mmol/L).  These blood tests may be repeated to confirm your diagnosis. What happens if blood glucose is too high? The pancreas produces a hormone (insulin) that helps move glucose from the bloodstream into cells. When cells in the body do not respond properly to insulin that the body makes (insulin resistance), excess glucose builds up in the blood instead of going into cells. As a result, high blood glucose (hyperglycemia) can develop, which can cause many complications. This is a symptom of prediabetes. What can happen if blood glucose stays higher than normal for a long time? Having high blood glucose for a long time is dangerous. Too much glucose in your blood can damage your nerves and blood vessels. Long-term damage can lead to complications from diabetes, which may include:  Heart disease.  Stroke.  Blindness.  Kidney disease.  Depression.  Poor circulation in the feet and legs, which could lead to surgical removal (amputation) in severe cases.  How can prediabetes be prevented from turning into type 2 diabetes?  To help prevent type 2 diabetes, take the following actions:  Be physically active. ? Do moderate-intensity physical activity for at least 30 minutes on at least 5 days of the week, or as much as told by your health care provider. This could be brisk walking, biking, or water aerobics. ? Ask your health care provider what  activities are safe for you. A mix of physical activities may be best, such as walking, swimming, cycling, and strength training.  Lose weight as told by your health care provider. ? Losing 5-7% of your body weight can reverse insulin resistance. ? Your health  care provider can determine how much weight loss is best for you and can help you lose weight safely.  Follow a healthy meal plan. This includes eating lean proteins, complex carbohydrates, fresh fruits and vegetables, low-fat dairy products, and healthy fats. ? Follow instructions from your health care provider about eating or drinking restrictions. ? Make an appointment to see a diet and nutrition specialist (registered dietitian) to help you create a healthy eating plan that is right for you.  Do not smoke or use any tobacco products, such as cigarettes, chewing tobacco, and e-cigarettes. If you need help quitting, ask your health care provider.  Take over-the-counter and prescription medicines as told by your health care provider. You may be prescribed medicines that help lower the risk of type 2 diabetes.  This information is not intended to replace advice given to you by your health care provider. Make sure you discuss any questions you have with your health care provider. Document Released: 08/20/2015 Document Revised: 10/04/2015 Document Reviewed: 06/19/2015 Elsevier Interactive Patient Education  [employer].

## 2017-05-13 NOTE — Progress Notes (Signed)
Subjective:  Patient ID: Brent Jimenez, male    DOB: November 28, 1955  Age: 62 y.o. MRN: [id number]  CC: Cough   HPI Brent Jimenez  is a 62 year old male with a history of stroke with residual expressive dysarthria, hypertension, stage III chronic kidney disease (followed by nephrology), prediabetes, hypercholesterolemia who comes into the clinic today for follow-up visit. Prediabetes is diet controlled.  He complains of a dry cough for the last 4 days which has not responded to the use of OTC Coricidin.  He also has nasal congestion but no fever, chest pains or shortness of breath.  He has not been seen in the clinic since 06/2016 but informs me he has been receiving his refills from his nephrologist. He has no other acute concerns today and is willing to receive the flu shot.  Past Medical History:  Diagnosis Date  . Brain bleed ([place]) 2007   expressive asphasia  . Chronic kidney disease   . Hypercholesteremia   . Hypertension   . Stroke [facility])     Past Surgical History:  Procedure Laterality Date  . BRAIN SURGERY  2007    No Known Allergies   Outpatient Medications Prior to Visit  Medication Sig Dispense Refill  . amLODipine (NORVASC) 5 MG tablet Take 1 tablet (5 mg total) by mouth daily. Needs office visit for refills 90 tablet 1  . calcitRIOL (ROCALTROL) 0.25 MCG capsule TAKE 1 CAPSULE BY MOUTH DAILY *NEEDS OFFICE VISIT* 90 capsule 0  . doxazosin (CARDURA) 4 MG tablet TAKE 1 TABLET BY MOUTH DAILY AT BEDTIME 90 tablet 0  . pravastatin (PRAVACHOL) 40 MG tablet TAKE ONE TABLET BY MOUTH ONE TIME DAILY  90 tablet 0  . labetalol (NORMODYNE) 300 MG tablet TAKE 1 TABLET BY MOUTH 2 TIMES DAILY. MUST HAVE OFFICE VISIT FOR REFILLS 180 tablet 1  . clobetasol cream (TEMOVATE) 4.09 % APPLY 1 APPLICATION TOPICALLY 2 TIMES DAILY. APPLY TO FINGERS ONLY (Patient not taking: Reported on 06/16/2016) 45 g 0  . sildenafil (VIAGRA) 50 MG tablet Take 1 tablet (50 mg total) by mouth daily as needed for  erectile dysfunction. (Patient not taking: Reported on 06/16/2016) 10 tablet 0  . sodium chloride (OCEAN) 0.65 % SOLN nasal spray Place 1 spray into both nostrils as needed for congestion. (Patient not taking: Reported on 05/13/2017) 1 Bottle 0   No facility-administered medications prior to visit.     ROS Review of Systems  Constitutional: Negative for activity change and appetite change.  HENT: Positive for congestion. Negative for sinus pressure and sore throat.   Eyes: Negative for visual disturbance.  Respiratory: Positive for cough. Negative for chest tightness and shortness of breath.   Cardiovascular: Negative for chest pain and leg swelling.  Gastrointestinal: Negative for abdominal distention, abdominal pain, constipation and diarrhea.  Endocrine: Negative.   Genitourinary: Negative for dysuria.  Musculoskeletal: Negative for joint swelling and myalgias.  Skin: Negative for rash.  Allergic/Immunologic: Negative.   Neurological: Positive for speech difficulty. Negative for weakness, light-headedness and numbness.  Psychiatric/Behavioral: Negative for dysphoric mood and suicidal ideas.    Objective:  BP 136/88   Pulse 71   Temp 97.6 F (36.4 C) (Oral)   Ht 6' 4"  (1.93 m)   Wt 230 lb (104.3 kg)   SpO2 97%   BMI 28.00 kg/m   BP/Weight 05/13/2017 06/16/2016 12/20/9145  Systolic BP 829 562 130  Diastolic BP 88 67 83  Wt. (Lbs) 230 225 228.6  BMI 28 27.39 27.84  Physical Exam  Constitutional: He is oriented to person, place, and time. He appears well-developed and well-nourished.  Cardiovascular: Normal rate, normal heart sounds and intact distal pulses.  No murmur heard. Pulmonary/Chest: Effort normal and breath sounds normal. He has no wheezes. He has no rales. He exhibits no tenderness.  Abdominal: Soft. Bowel sounds are normal. He exhibits no distension and no mass. There is no tenderness.  Musculoskeletal: Normal range of motion.  Neurological: He is alert and  oriented to person, place, and time.  Psychiatric: He has a normal mood and affect.     CMP Latest Ref Rng & Units 11/07/2015 09/14/2014 12/22/2013  Glucose 65 - 99 mg/dL 96 89 99  BUN 7 - 25 mg/dL 29(H) 20 23  Creatinine 0.70 - 1.25 mg/dL 3.44(H) 3.24(H) 3.27(H)  Sodium 135 - 146 mmol/L 139 140 140  Potassium 3.5 - 5.3 mmol/L 3.7 4.3 4.0  Chloride 98 - 110 mmol/L 105 105 107  CO2 20 - 31 mmol/L 25 23 24   Calcium 8.6 - 10.3 mg/dL 9.3 9.3 9.4  Total Protein 6.1 - 8.1 g/dL 7.0 7.3 7.2  Total Bilirubin 0.2 - 1.2 mg/dL 0.7 0.8 0.7  Alkaline Phos 40 - 115 U/L 64 62 65  AST 10 - 35 U/L 17 18 17   ALT 9 - 46 U/L 13 13 14     Assessment & Plan:   1. Essential hypertension, benign Controlled Low-sodium, DASH diet - labetalol (NORMODYNE) 300 MG tablet; TAKE 1 TABLET BY MOUTH 2 TIMES DAILY.  Dispense: 180 tablet; Refill: 1 - CMP14+EGFR  2. Stage 3 chronic kidney disease ([place]) CKD Stage 3-4 Likely hypertensive nephropathy Avoid nephrotoxins Keep appointment with Dr. Posey Pronto - Nephrologist [facility]  3. Prediabetes A1c 6.0 Continue diabetic diet Other lifestyle modifications to prevent development of diabetes   Meds ordered this encounter  Medications  . labetalol (NORMODYNE) 300 MG tablet    Sig: TAKE 1 TABLET BY MOUTH 2 TIMES DAILY.    Dispense:  180 tablet    Refill:  1  . benzonatate (TESSALON) 100 MG capsule    Sig: Take 1 capsule (100 mg total) by mouth 2 (two) times daily as needed for cough.    Dispense:  20 capsule    Refill:  0  . cetirizine (ZYRTEC) 10 MG tablet    Sig: Take 1 tablet (10 mg total) by mouth daily.    Dispense:  30 tablet    Refill:  1    Follow-up: Return in about 6 months (around 11/10/2017) for Follow-up of chronic medical conditions.   Arnoldo Morale MD

## 2017-05-14 ENCOUNTER — Telehealth: Payer: Self-pay

## 2017-05-14 LAB — CMP14+EGFR
A/G RATIO: 2 (ref 1.2–2.2)
ALT: 20 IU/L (ref 0–44)
AST: 18 IU/L (ref 0–40)
Albumin: 4.5 g/dL (ref 3.6–4.8)
Alkaline Phosphatase: 67 IU/L (ref 39–117)
BILIRUBIN TOTAL: 0.4 mg/dL (ref 0.0–1.2)
BUN/Creatinine Ratio: 8 — ABNORMAL LOW (ref 10–24)
BUN: 28 mg/dL — AB (ref 8–27)
CHLORIDE: 106 mmol/L (ref 96–106)
CO2: 20 mmol/L (ref 20–29)
Calcium: 9.1 mg/dL (ref 8.6–10.2)
Creatinine, Ser: 3.49 mg/dL — ABNORMAL HIGH (ref 0.76–1.27)
GFR calc Af Amer: 20 mL/min/{1.73_m2} — ABNORMAL LOW (ref >59)
GFR calc non Af Amer: 18 mL/min/{1.73_m2} — ABNORMAL LOW (ref >59)
Globulin, Total: 2.3 g/dL (ref 1.5–4.5)
Glucose: 78 mg/dL (ref 65–99)
POTASSIUM: 4 mmol/L (ref 3.5–5.2)
Sodium: 144 mmol/L (ref 134–144)
Total Protein: 6.8 g/dL (ref 6.0–8.5)

## 2017-05-14 NOTE — Telephone Encounter (Signed)
Pt was called and the number on file is not in service. Emergency contact number also not in service.

## 2017-05-21 ENCOUNTER — Telehealth: Payer: Self-pay

## 2017-05-21 NOTE — Telephone Encounter (Signed)
Pt was called and there is no VM set up to leave a message.   If pt returns phone call please inform pt Labs reveal a decline in kidney function. Please advised to keep appointment with his nephrologist and avoid NSAIDs

## 2017-06-05 DIAGNOSIS — N189 Chronic kidney disease, unspecified: Secondary | ICD-10-CM | POA: Diagnosis not present

## 2017-06-05 DIAGNOSIS — N184 Chronic kidney disease, stage 4 (severe): Secondary | ICD-10-CM | POA: Diagnosis not present

## 2017-06-05 DIAGNOSIS — N2581 Secondary hyperparathyroidism of renal origin: Secondary | ICD-10-CM | POA: Diagnosis not present

## 2017-06-09 DIAGNOSIS — D631 Anemia in chronic kidney disease: Secondary | ICD-10-CM | POA: Diagnosis not present

## 2017-06-09 DIAGNOSIS — N184 Chronic kidney disease, stage 4 (severe): Secondary | ICD-10-CM | POA: Diagnosis not present

## 2017-06-09 DIAGNOSIS — I129 Hypertensive chronic kidney disease with stage 1 through stage 4 chronic kidney disease, or unspecified chronic kidney disease: Secondary | ICD-10-CM | POA: Diagnosis not present

## 2017-06-09 DIAGNOSIS — N2581 Secondary hyperparathyroidism of renal origin: Secondary | ICD-10-CM | POA: Diagnosis not present

## 2017-09-02 DIAGNOSIS — N2581 Secondary hyperparathyroidism of renal origin: Secondary | ICD-10-CM | POA: Diagnosis not present

## 2017-09-02 DIAGNOSIS — N184 Chronic kidney disease, stage 4 (severe): Secondary | ICD-10-CM | POA: Diagnosis not present

## 2017-09-07 DIAGNOSIS — D631 Anemia in chronic kidney disease: Secondary | ICD-10-CM | POA: Diagnosis not present

## 2017-09-07 DIAGNOSIS — I129 Hypertensive chronic kidney disease with stage 1 through stage 4 chronic kidney disease, or unspecified chronic kidney disease: Secondary | ICD-10-CM | POA: Diagnosis not present

## 2017-09-07 DIAGNOSIS — N2581 Secondary hyperparathyroidism of renal origin: Secondary | ICD-10-CM | POA: Diagnosis not present

## 2017-09-07 DIAGNOSIS — N184 Chronic kidney disease, stage 4 (severe): Secondary | ICD-10-CM | POA: Diagnosis not present

## 2017-11-05 ENCOUNTER — Encounter (HOSPITAL_COMMUNITY): Payer: Self-pay | Admitting: Emergency Medicine

## 2017-11-05 ENCOUNTER — Ambulatory Visit (HOSPITAL_COMMUNITY)
Admission: EM | Admit: 2017-11-05 | Discharge: 2017-11-05 | Disposition: A | Discharge status: Home / Self Care | Payer: Medicare Other | Attending: Family Medicine | Admitting: Family Medicine

## 2017-11-05 DIAGNOSIS — H903 Sensorineural hearing loss, bilateral: Secondary | ICD-10-CM | POA: Diagnosis present

## 2017-11-05 DIAGNOSIS — L509 Urticaria, unspecified: Secondary | ICD-10-CM | POA: Diagnosis not present

## 2017-11-05 HISTORY — DX: Sensorineural hearing loss, bilateral: H90.3

## 2017-11-05 MED ORDER — PREDNISONE 20 MG PO TABS: ORAL_TABLET | ORAL | 0 refills | Status: DC

## 2017-11-05 NOTE — ED Triage Notes (Signed)
Pt sts generalized rash with itching

## 2017-11-05 NOTE — ED Provider Notes (Signed)
[facility]   [id number] 11/05/17 Arrival Time: 8676   SUBJECTIVE:  Brent Jimenez is a 62 y.o. male who presents to the urgent care with complaint of generalized rash with itching.  He noted some gnats yesterday and used a cleaning agent to get rid of them  Rash comes and goes.  Very itchy.  Patient has chronic azotemia but the rash and itching is new over the last 24 hours.  Past Medical History:  Diagnosis Date  . Brain bleed ([place]) 2007   expressive asphasia  . Chronic kidney disease   . Hypercholesteremia   . Hypertension   . Stroke [facility])    Family History  Problem Relation Age of Onset  . Colon cancer Neg Hx   . Heart disease Mother   . Stroke Father 5       died of stroke  . Hypertension Father    Social History   Socioeconomic History  . Marital status: Married    Spouse name: Not on file  . Number of children: Not on file  . Years of education: Not on file  . Highest education level: Not on file  Occupational History  . Not on file  Social Needs  . Financial resource strain: Not on file  . Food insecurity:    Worry: Not on file    Inability: Not on file  . Transportation needs:    Medical: Not on file    Non-medical: Not on file  Tobacco Use  . Smoking status: Never Smoker  . Smokeless tobacco: Never Used  Substance and Sexual Activity  . Alcohol use: No  . Drug use: No  . Sexual activity: Not on file  Lifestyle  . Physical activity:    Days per week: Not on file    Minutes per session: Not on file  . Stress: Not on file  Relationships  . Social connections:    Talks on phone: Not on file    Gets together: Not on file    Attends religious service: Not on file    Active member of club or organization: Not on file    Attends meetings of clubs or organizations: Not on file    Relationship status: Not on file  . Intimate partner violence:    Fear of current or ex partner: Not on file    Emotionally abused: Not on file   Physically abused: Not on file    Forced sexual activity: Not on file  Other Topics Concern  . Not on file  Social History Narrative  . Not on file   No outpatient medications have been marked as taking for the 11/05/17 encounter [facility]).   No Known Allergies    ROS: As per HPI, remainder of ROS negative.   OBJECTIVE:   Vitals:   11/05/17 1550  BP: 108/63  Pulse: 80  Resp: 18  Temp: 98 F (36.7 C)  TempSrc: Oral  SpO2: 97%     General appearance: alert; no distress Eyes: PERRL; EOMI; conjunctiva normal HENT: normocephalic; atraumatic; TMs normal, canal normal, external ears normal without trauma; nasal mucosa normal; oral mucosa normal Neck: supple Lungs: clear to auscultation bilaterally Heart: regular rate and rhythm Back: no CVA tenderness Extremities: no cyanosis or edema; symmetrical with no gross deformities Skin: warm and dry Neurologic: normal gait; grossly normal Psychological: alert and cooperative; normal mood and affect      Labs:  Results for orders placed or performed in visit on 05/13/17  CMP14+EGFR  Result Value Ref Range   Glucose 78 65 - 99 mg/dL   BUN 28 (H) 8 - 27 mg/dL   Creatinine, Ser 3.49 (H) 0.76 - 1.27 mg/dL   GFR calc non Af Amer 18 (L) >59 mL/min/1.73   GFR calc Af Amer 20 (L) >59 mL/min/1.73   BUN/Creatinine Ratio 8 (L) 10 - 24   Sodium 144 134 - 144 mmol/L   Potassium 4.0 3.5 - 5.2 mmol/L   Chloride 106 96 - 106 mmol/L   CO2 20 20 - 29 mmol/L   Calcium 9.1 8.6 - 10.2 mg/dL   Total Protein 6.8 6.0 - 8.5 g/dL   Albumin 4.5 3.6 - 4.8 g/dL   Globulin, Total 2.3 1.5 - 4.5 g/dL   Albumin/Globulin Ratio 2.0 1.2 - 2.2   Bilirubin Total 0.4 0.0 - 1.2 mg/dL   Alkaline Phosphatase 67 39 - 117 IU/L   AST 18 0 - 40 IU/L   ALT 20 0 - 44 IU/L  POCT glycosylated hemoglobin (Hb A1C)  Result Value Ref Range   Hemoglobin A1C 6.0     Labs Reviewed - No data to display  No results found.     ASSESSMENT &  PLAN:  1. Urticaria   2. Sensorineural hearing loss (SNHL) of both ears     Meds ordered this encounter  Medications  . predniSONE (DELTASONE) 20 MG tablet    Sig: Two daily with food    Dispense:  6 tablet    Refill:  0    Reviewed expectations re: course of current medical issues. Questions answered. Outlined signs and symptoms indicating need for more acute intervention. Patient verbalized understanding. After Visit Summary given.    Procedures:      Robyn Haber, MD 11/05/17 1616

## 2017-11-09 ENCOUNTER — Ambulatory Visit: Payer: Medicare Other | Attending: Internal Medicine | Admitting: Internal Medicine

## 2017-11-09 ENCOUNTER — Encounter: Payer: Self-pay | Admitting: Internal Medicine

## 2017-11-09 VITALS — BP 114/75 | HR 62 | Temp 98.1°F | Resp 16 | Wt 224.2 lb

## 2017-11-09 DIAGNOSIS — N529 Male erectile dysfunction, unspecified: Secondary | ICD-10-CM | POA: Insufficient documentation

## 2017-11-09 DIAGNOSIS — L299 Pruritus, unspecified: Secondary | ICD-10-CM | POA: Insufficient documentation

## 2017-11-09 DIAGNOSIS — E7800 Pure hypercholesterolemia, unspecified: Secondary | ICD-10-CM | POA: Diagnosis not present

## 2017-11-09 DIAGNOSIS — E1122 Type 2 diabetes mellitus with diabetic chronic kidney disease: Secondary | ICD-10-CM | POA: Diagnosis not present

## 2017-11-09 DIAGNOSIS — E559 Vitamin D deficiency, unspecified: Secondary | ICD-10-CM | POA: Diagnosis not present

## 2017-11-09 DIAGNOSIS — L509 Urticaria, unspecified: Secondary | ICD-10-CM | POA: Insufficient documentation

## 2017-11-09 DIAGNOSIS — N189 Chronic kidney disease, unspecified: Secondary | ICD-10-CM | POA: Diagnosis not present

## 2017-11-09 DIAGNOSIS — I129 Hypertensive chronic kidney disease with stage 1 through stage 4 chronic kidney disease, or unspecified chronic kidney disease: Secondary | ICD-10-CM | POA: Insufficient documentation

## 2017-11-09 DIAGNOSIS — N400 Enlarged prostate without lower urinary tract symptoms: Secondary | ICD-10-CM | POA: Diagnosis not present

## 2017-11-09 DIAGNOSIS — Z79899 Other long term (current) drug therapy: Secondary | ICD-10-CM | POA: Diagnosis not present

## 2017-11-09 MED ORDER — LORATADINE 10 MG PO TABS: 10.0000 mg | ORAL_TABLET | Freq: Every day | ORAL | 0 refills | Status: DC

## 2017-11-09 NOTE — Progress Notes (Signed)
Patient ID: Brent Jimenez, male    DOB: Oct 02, 1955  MRN: [id number]  CC: Hospitalization Follow-up (ED)   Subjective: Brent Jimenez is a 62 y.o. male who presents for ER f/u.  Wife is with him. His concerns today include:  Pt with CKD, HTN, CVA, HL,pre-DM  Pt c/o having hives on  arms, legs, trunk and back of head 3 days ago.  He and his wife were cleaning out a building and thinks he may have touch some trash and chemicals.  Seen at [facility] and given Prednisone 6 tabs.  The rash has resolved and itching decreased but he still has feeling of itching intermittently.  Patient Active Problem List   Diagnosis Date Noted  . Sensorineural hearing loss (SNHL) of both ears 11/05/2017  . Hypercholesteremia 11/07/2015  . Essential hypertension, benign 11/30/2013  . Other and unspecified hyperlipidemia 11/30/2013  . Chronic kidney disease 11/30/2013  . BPH (benign prostatic hyperplasia) 11/30/2013  . Prediabetes 11/30/2013  . Vitamin D deficiency 11/30/2013  . Erectile dysfunction 11/30/2013     Current Outpatient Medications on File Prior to Visit  Medication Sig Dispense Refill  . amLODipine (NORVASC) 5 MG tablet Take 1 tablet (5 mg total) by mouth daily. Needs office visit for refills 90 tablet 1  . calcitRIOL (ROCALTROL) 0.25 MCG capsule TAKE 1 CAPSULE BY MOUTH DAILY *NEEDS OFFICE VISIT* 90 capsule 0  . doxazosin (CARDURA) 4 MG tablet TAKE 1 TABLET BY MOUTH DAILY AT BEDTIME 90 tablet 0  . labetalol (NORMODYNE) 300 MG tablet TAKE 1 TABLET BY MOUTH 2 TIMES DAILY. 180 tablet 1  . pravastatin (PRAVACHOL) 40 MG tablet TAKE ONE TABLET BY MOUTH ONE TIME DAILY  90 tablet 0  . predniSONE (DELTASONE) 20 MG tablet Two daily with food 6 tablet 0   No current facility-administered medications on file prior to visit.     No Known Allergies  Social History   Socioeconomic History  . Marital status: Married    Spouse name: Not on file  . Number of children: Not on file  . Years of education:  Not on file  . Highest education level: Not on file  Occupational History  . Not on file  Social Needs  . Financial resource strain: Not on file  . Food insecurity:    Worry: Not on file    Inability: Not on file  . Transportation needs:    Medical: Not on file    Non-medical: Not on file  Tobacco Use  . Smoking status: Never Smoker  . Smokeless tobacco: Never Used  Substance and Sexual Activity  . Alcohol use: No  . Drug use: No  . Sexual activity: Not on file  Lifestyle  . Physical activity:    Days per week: Not on file    Minutes per session: Not on file  . Stress: Not on file  Relationships  . Social connections:    Talks on phone: Not on file    Gets together: Not on file    Attends religious service: Not on file    Active member of club or organization: Not on file    Attends meetings of clubs or organizations: Not on file    Relationship status: Not on file  . Intimate partner violence:    Fear of current or ex partner: Not on file    Emotionally abused: Not on file    Physically abused: Not on file    Forced sexual activity: Not on file  Other  Topics Concern  . Not on file  Social History Narrative  . Not on file    Family History  Problem Relation Age of Onset  . Colon cancer Neg Hx   . Heart disease Mother   . Stroke Father 73       died of stroke  . Hypertension Father     Past Surgical History:  Procedure Laterality Date  . BRAIN SURGERY  2007    ROS: Review of Systems Neg except as above  PHYSICAL EXAM: BP 114/75   Pulse 62   Temp 98.1 F (36.7 C) (Oral)   Resp 16   Wt 224 lb 3.2 oz (101.7 kg)   SpO2 100%   BMI 27.29 kg/m   Physical Exam  General appearance - alert, well appearing, and in no distress Mental status - normal mood, behavior, speech, dress, motor activity, and thought processes Skin - no rash seen at this time   ASSESSMENT AND PLAN: 1. Itching With recent hives that responded to Prednisone.  Advised to avoid  chemicals and wear protective gear like gloves when cleaning.  Since rash has resolved but sttill having a little itching, will give limited course of Claritin.  Based on eGFR, Claritin can be dose Q24-48 hrs.  - loratadine (CLARITIN) 10 MG tablet; Take 1 tablet (10 mg total) by mouth daily.  Dispense: 15 tablet; Refill: 0  Patient was given the opportunity to ask questions.  Patient verbalized understanding of the plan and was able to repeat key elements of the plan.   No orders of the defined types were placed in this encounter.    Requested Prescriptions   Signed Prescriptions Disp Refills  . loratadine (CLARITIN) 10 MG tablet 15 tablet 0    Sig: Take 1 tablet (10 mg total) by mouth daily.    Return if symptoms worsen or fail to improve.  Karle Plumber, MD, FACP

## 2017-11-09 NOTE — Progress Notes (Signed)
Pt states at night he feel the itching on his arms  Pt states when he takes a shower and puts lotion on the itching stops

## 2017-11-23 DIAGNOSIS — N189 Chronic kidney disease, unspecified: Secondary | ICD-10-CM | POA: Diagnosis not present

## 2017-11-23 DIAGNOSIS — N2581 Secondary hyperparathyroidism of renal origin: Secondary | ICD-10-CM | POA: Diagnosis not present

## 2017-11-23 DIAGNOSIS — N184 Chronic kidney disease, stage 4 (severe): Secondary | ICD-10-CM | POA: Diagnosis not present

## 2017-11-30 DIAGNOSIS — D631 Anemia in chronic kidney disease: Secondary | ICD-10-CM | POA: Diagnosis not present

## 2017-11-30 DIAGNOSIS — I129 Hypertensive chronic kidney disease with stage 1 through stage 4 chronic kidney disease, or unspecified chronic kidney disease: Secondary | ICD-10-CM | POA: Diagnosis not present

## 2017-11-30 DIAGNOSIS — N184 Chronic kidney disease, stage 4 (severe): Secondary | ICD-10-CM | POA: Diagnosis not present

## 2017-11-30 DIAGNOSIS — N2581 Secondary hyperparathyroidism of renal origin: Secondary | ICD-10-CM | POA: Diagnosis not present

## 2018-05-18 ENCOUNTER — Other Ambulatory Visit: Payer: Self-pay | Admitting: Family Medicine

## 2018-05-18 DIAGNOSIS — I10 Essential (primary) hypertension: Secondary | ICD-10-CM

## 2018-05-18 MED ORDER — LABETALOL HCL 300 MG PO TABS: ORAL_TABLET | ORAL | 0 refills | Status: DC

## 2018-05-18 NOTE — Telephone Encounter (Signed)
Patients wife Caspian Deleonardis called again to request his medication  -labetalol (NORMODYNE) 300 MG tablet  be sent to -[address] [place], [place] [address]-

## 2018-05-18 NOTE — Telephone Encounter (Signed)
1) Medication(s) Requested (by name): labetalol 2) Pharmacy of Choice:  [facility] [address]

## 2018-05-24 ENCOUNTER — Telehealth: Payer: Self-pay

## 2018-05-24 DIAGNOSIS — E7800 Pure hypercholesterolemia, unspecified: Secondary | ICD-10-CM

## 2018-05-24 NOTE — Telephone Encounter (Signed)
Patient would like a refill on pravastatin medication sent to [facility] [address]. Patient has appointment on 06/09/18

## 2018-05-25 ENCOUNTER — Other Ambulatory Visit: Payer: Self-pay | Admitting: Family Medicine

## 2018-05-25 DIAGNOSIS — E7800 Pure hypercholesterolemia, unspecified: Secondary | ICD-10-CM

## 2018-05-25 MED ORDER — PRAVASTATIN SODIUM 40 MG PO TABS: 40.0000 mg | ORAL_TABLET | Freq: Every day | ORAL | 0 refills | Status: DC

## 2018-05-25 NOTE — Telephone Encounter (Signed)
Patient was called and informed of medication refill.

## 2018-05-25 NOTE — Telephone Encounter (Signed)
Refilled

## 2018-06-08 ENCOUNTER — Other Ambulatory Visit: Payer: Self-pay | Admitting: Family Medicine

## 2018-06-08 DIAGNOSIS — I10 Essential (primary) hypertension: Secondary | ICD-10-CM

## 2018-06-09 ENCOUNTER — Ambulatory Visit: Payer: Medicare Other | Attending: Family Medicine | Admitting: Family Medicine

## 2018-06-09 ENCOUNTER — Encounter: Payer: Self-pay | Admitting: Family Medicine

## 2018-06-09 VITALS — BP 133/87 | HR 65 | Temp 97.8°F | Ht 76.0 in | Wt 224.0 lb

## 2018-06-09 DIAGNOSIS — I10 Essential (primary) hypertension: Secondary | ICD-10-CM

## 2018-06-09 DIAGNOSIS — I129 Hypertensive chronic kidney disease with stage 1 through stage 4 chronic kidney disease, or unspecified chronic kidney disease: Secondary | ICD-10-CM | POA: Insufficient documentation

## 2018-06-09 DIAGNOSIS — Z79899 Other long term (current) drug therapy: Secondary | ICD-10-CM | POA: Diagnosis not present

## 2018-06-09 DIAGNOSIS — Z1159 Encounter for screening for other viral diseases: Secondary | ICD-10-CM | POA: Insufficient documentation

## 2018-06-09 DIAGNOSIS — Z1211 Encounter for screening for malignant neoplasm of colon: Secondary | ICD-10-CM

## 2018-06-09 DIAGNOSIS — N184 Chronic kidney disease, stage 4 (severe): Secondary | ICD-10-CM | POA: Diagnosis not present

## 2018-06-09 DIAGNOSIS — E7800 Pure hypercholesterolemia, unspecified: Secondary | ICD-10-CM | POA: Diagnosis not present

## 2018-06-09 DIAGNOSIS — R7303 Prediabetes: Secondary | ICD-10-CM | POA: Insufficient documentation

## 2018-06-09 DIAGNOSIS — Z23 Encounter for immunization: Secondary | ICD-10-CM

## 2018-06-09 LAB — POCT GLYCOSYLATED HEMOGLOBIN (HGB A1C): HBA1C, POC (PREDIABETIC RANGE): 5.8 % (ref 5.7–6.4)

## 2018-06-09 MED ORDER — TETANUS-DIPHTH-ACELL PERTUSSIS 5-2.5-18.5 LF-MCG/0.5 IM SUSP: 0.5000 mL | Freq: Once | INTRAMUSCULAR | 0 refills | Status: AC

## 2018-06-09 MED ORDER — DOXAZOSIN MESYLATE 8 MG PO TABS: 8.0000 mg | ORAL_TABLET | Freq: Every day | ORAL | 1 refills | Status: DC

## 2018-06-09 MED ORDER — CALCITRIOL 0.5 MCG PO CAPS: 0.5000 ug | ORAL_CAPSULE | Freq: Every day | ORAL | 1 refills | Status: DC

## 2018-06-09 MED ORDER — AMLODIPINE BESYLATE 10 MG PO TABS: 10.0000 mg | ORAL_TABLET | Freq: Every day | ORAL | 1 refills | Status: DC

## 2018-06-09 MED ORDER — LABETALOL HCL 300 MG PO TABS: ORAL_TABLET | ORAL | 1 refills | Status: DC

## 2018-06-09 MED ORDER — PRAVASTATIN SODIUM 40 MG PO TABS: 40.0000 mg | ORAL_TABLET | Freq: Every day | ORAL | 1 refills | Status: DC

## 2018-06-09 MED FILL — ADACEL 5-2-15.5 LF-MCG/0.5: 5-2-15.5 | 1 days supply | Qty: 1 | Fill #0

## 2018-06-09 NOTE — Progress Notes (Signed)
Subjective:  Patient ID: Brent Jimenez, male    DOB: 09-05-1955  Age: 63 y.o. MRN: [id number]  CC: Hypertension   HPI Stuart Mirabile  is a 63 year old male with a history of stroke with residual expressive dysarthria, hypertension, stage IV chronic kidney disease (followed by nephrology), prediabetes, hypercholesterolemia who comes into the clinic today for follow-up visit. Prediabetes is diet controlled. He requests a referral to a new nephrologist but is previously followed by [facility].  His pulse is unhappy about the fact that his nephrologist has been ordering labs every 3 months as opposed to every year since his labs has been stable for the last couple of years. He has been compliant with all his medications and denies chest pains or shortness of breath. His A1c is 5.8 today.  Past Medical History:  Diagnosis Date  . Brain bleed ([place]) 2007   expressive asphasia  . Chronic kidney disease   . Hypercholesteremia   . Hypertension   . Stroke [facility])     Past Surgical History:  Procedure Laterality Date  . BRAIN SURGERY  2007    No Known Allergies   Outpatient Medications Prior to Visit  Medication Sig Dispense Refill  . loratadine (CLARITIN) 10 MG tablet Take 1 tablet (10 mg total) by mouth daily. 15 tablet 0  . amLODipine (NORVASC) 10 MG tablet Take 10 mg by mouth daily.    . calcitRIOL (ROCALTROL) 0.5 MCG capsule Take 0.5 mcg by mouth daily.    Marland Kitchen doxazosin (CARDURA) 8 MG tablet Take 8 mg by mouth daily.    Marland Kitchen labetalol (NORMODYNE) 300 MG tablet TAKE 1 TABLET BY MOUTH 2 TIMES DAILY. 60 tablet 0  . pravastatin (PRAVACHOL) 40 MG tablet Take 1 tablet (40 mg total) by mouth daily. 30 tablet 0  . predniSONE (DELTASONE) 20 MG tablet Two daily with food (Patient not taking: Reported on 06/09/2018) 6 tablet 0   No facility-administered medications prior to visit.     ROS Review of Systems  Constitutional: Negative for activity change and appetite change.   HENT: Negative for sinus pressure and sore throat.   Eyes: Negative for visual disturbance.  Respiratory: Negative for cough, chest tightness and shortness of breath.   Cardiovascular: Negative for chest pain and leg swelling.  Gastrointestinal: Negative for abdominal distention, abdominal pain, constipation and diarrhea.  Endocrine: Negative.   Genitourinary: Negative for dysuria.  Musculoskeletal: Negative for joint swelling and myalgias.  Skin: Negative for rash.  Allergic/Immunologic: Negative.   Neurological: Negative for weakness, light-headedness and numbness.  Psychiatric/Behavioral: Negative for dysphoric mood and suicidal ideas.    Objective:  BP 133/87   Pulse 65   Temp 97.8 F (36.6 C) (Oral)   Ht '6\' 4"'$  (1.93 m)   Wt 224 lb (101.6 kg)   SpO2 99%   BMI 27.27 kg/m   BP/Weight 06/09/2018 11/09/2017 9/39/0300  Systolic BP 923 300 762  Diastolic BP 87 75 63  Wt. (Lbs) 224 224.2 -  BMI 27.27 27.29 -      Physical Exam Constitutional:      Appearance: He is well-developed.  Cardiovascular:     Rate and Rhythm: Normal rate.     Heart sounds: Normal heart sounds. No murmur.  Pulmonary:     Effort: Pulmonary effort is normal.     Breath sounds: Normal breath sounds. No wheezing or rales.  Chest:     Chest wall: No tenderness.  Abdominal:     General: Bowel sounds are normal.  There is no distension.     Palpations: Abdomen is soft. There is no mass.     Tenderness: There is no abdominal tenderness.  Musculoskeletal: Normal range of motion.  Neurological:     Mental Status: He is alert and oriented to person, place, and time.  Psychiatric:        Mood and Affect: Mood normal.        Behavior: Behavior normal.      CMP Latest Ref Rng & Units 05/13/2017 11/07/2015 09/14/2014  Glucose 65 - 99 mg/dL 78 96 89  BUN 8 - 27 mg/dL 28(H) 29(H) 20  Creatinine 0.76 - 1.27 mg/dL 3.49(H) 3.44(H) 3.24(H)  Sodium 134 - 144 mmol/L 144 139 140  Potassium 3.5 - 5.2 mmol/L 4.0  3.7 4.3  Chloride 96 - 106 mmol/L 106 105 105  CO2 20 - 29 mmol/L '20 25 23  '$ Calcium 8.6 - 10.2 mg/dL 9.1 9.3 9.3  Total Protein 6.0 - 8.5 g/dL 6.8 7.0 7.3  Total Bilirubin 0.0 - 1.2 mg/dL 0.4 0.7 0.8  Alkaline Phos 39 - 117 IU/L 67 64 62  AST 0 - 40 IU/L '18 17 18  '$ ALT 0 - 44 IU/L '20 13 13    '$ Lipid Panel     Component Value Date/Time   CHOL 138 06/09/2018 1132   TRIG 71 06/09/2018 1132   HDL 40 06/09/2018 1132   CHOLHDL 3.5 06/09/2018 1132   CHOLHDL 3.7 11/07/2015 1119   VLDL 20 11/07/2015 1119   LDLCALC 84 06/09/2018 1132   LDLDIRECT 97 10/23/2009 2324    Assessment & Plan:   1. Hypercholesteremia Controlled Low-cholesterol diet - pravastatin (PRAVACHOL) 40 MG tablet; Take 1 tablet (40 mg total) by mouth daily.  Dispense: 90 tablet; Refill: 1  2. Essential hypertension, benign Controlled Low-sodium, DASH diet - CMP14+EGFR - Lipid panel - labetalol (NORMODYNE) 300 MG tablet; TAKE 1 TABLET BY MOUTH 2 TIMES DAILY.  Dispense: 180 tablet; Refill: 1 - amLODipine (NORVASC) 10 MG tablet; Take 1 tablet (10 mg total) by mouth daily.  Dispense: 90 tablet; Refill: 1  3. Screening for colon cancer - Ambulatory referral to Gastroenterology  4. Stage 4 chronic kidney disease ([place]) Will refer to nephrology as per request - Ambulatory referral to Nephrology - CBC with Differential/Platelet - Hepatitis c antibody (reflex) - calcitRIOL (ROCALTROL) 0.5 MCG capsule; Take 1 capsule (0.5 mcg total) by mouth daily.  Dispense: 90 capsule; Refill: 1  5. Prediabetes A1c of 5.8 Discussed dietary modifications to prevent developing diabetes mellitus  6. Screening for viral disease - HIV Antibody (routine testing w rflx)   Meds ordered this encounter  Medications  . Tdap (BOOSTRIX) 5-2.5-18.5 LF-MCG/0.5 injection    Sig: Inject 0.5 mLs into the muscle once for 1 dose.    Dispense:  0.5 mL    Refill:  0  . pravastatin (PRAVACHOL) 40 MG tablet    Sig: Take 1 tablet (40 mg total) by  mouth daily.    Dispense:  90 tablet    Refill:  1  . labetalol (NORMODYNE) 300 MG tablet    Sig: TAKE 1 TABLET BY MOUTH 2 TIMES DAILY.    Dispense:  180 tablet    Refill:  1  . amLODipine (NORVASC) 10 MG tablet    Sig: Take 1 tablet (10 mg total) by mouth daily.    Dispense:  90 tablet    Refill:  1  . calcitRIOL (ROCALTROL) 0.5 MCG capsule    Sig: Take 1  capsule (0.5 mcg total) by mouth daily.    Dispense:  90 capsule    Refill:  1  . doxazosin (CARDURA) 8 MG tablet    Sig: Take 1 tablet (8 mg total) by mouth daily.    Dispense:  90 tablet    Refill:  1    Follow-up: Return in about 3 months (around 09/08/2018) for Follow-up of chronic medical conditions.   [name] MD

## 2018-06-09 NOTE — Patient Instructions (Signed)
Prediabetes Eating Plan  Prediabetes is a condition that causes blood sugar (glucose) levels to be higher than normal. This increases the risk for developing diabetes. In order to prevent diabetes from developing, your health care provider may recommend a diet and other lifestyle changes to help you:  · Control your blood glucose levels.  · Improve your cholesterol levels.  · Manage your blood pressure.  Your health care provider may recommend working with a diet and nutrition specialist (dietitian) to make a meal plan that is best for you.  What are tips for following this plan?  Lifestyle  · Set weight loss goals with the help of your health care team. It is recommended that most people with prediabetes lose 7% of their current body weight.  · Exercise for at least 30 minutes at least 5 days a week.  · Attend a support group or seek ongoing support from a mental health counselor.  · Take over-the-counter and prescription medicines only as told by your health care provider.  Reading food labels  · Read food labels to check the amount of fat, salt (sodium), and sugar in prepackaged foods. Avoid foods that have:  ? Saturated fats.  ? Trans fats.  ? Added sugars.  · Avoid foods that have more than 300 milligrams (mg) of sodium per serving. Limit your daily sodium intake to less than 2,300 mg each day.  Shopping  · Avoid buying pre-made and processed foods.  Cooking  · Cook with olive oil. Do not use butter, lard, or ghee.  · Bake, broil, grill, or boil foods. Avoid frying.  Meal planning    · Work with your dietitian to develop an eating plan that is right for you. This may include:  ? Tracking how many calories you take in. Use a food diary, notebook, or mobile application to track what you eat at each meal.  ? Using the glycemic index (GI) to plan your meals. The index tells you how quickly a food will raise your blood glucose. Choose low-GI foods. These foods take a longer time to raise blood glucose.  · Consider  following a Mediterranean diet. This diet includes:  ? Several servings each day of fresh fruits and vegetables.  ? Eating fish at least twice a week.  ? Several servings each day of whole grains, beans, nuts, and seeds.  ? Using olive oil instead of other fats.  ? Moderate alcohol consumption.  ? Eating small amounts of red meat and whole-fat dairy.  · If you have high blood pressure, you may need to limit your sodium intake or follow a diet such as the DASH eating plan. DASH is an eating plan that aims to lower high blood pressure.  What foods are recommended?  The items listed below may not be a complete list. Talk with your dietitian about what dietary choices are best for you.  Grains  Whole grains, such as whole-wheat or whole-grain breads, crackers, cereals, and pasta. Unsweetened oatmeal. Bulgur. Barley. Quinoa. Brown rice. Corn or whole-wheat flour tortillas or taco shells.  Vegetables  Lettuce. Spinach. Peas. Beets. Cauliflower. Cabbage. Broccoli. Carrots. Tomatoes. Squash. Eggplant. Herbs. Peppers. Onions. Cucumbers. Brussels sprouts.  Fruits  Berries. Bananas. Apples. Oranges. Grapes. Papaya. Mango. Pomegranate. Kiwi. Grapefruit. Cherries.  Meats and other protein foods  Seafood. Poultry without skin. Lean cuts of pork and beef. Tofu. Eggs. Nuts. Beans.  Dairy  Low-fat or fat-free dairy products, such as yogurt, cottage cheese, and cheese.  Beverages  Water.   Tea. Coffee. Sugar-free or diet soda. Seltzer water. Lowfat or no-fat milk. Milk alternatives, such as soy or almond milk.  Fats and oils  Olive oil. Canola oil. Sunflower oil. Grapeseed oil. Avocado. Walnuts.  Sweets and desserts  Sugar-free or low-fat pudding. Sugar-free or low-fat ice cream and other frozen treats.  Seasoning and other foods  Herbs. Sodium-free spices. Mustard. Relish. Low-fat, low-sugar ketchup. Low-fat, low-sugar barbecue sauce. Low-fat or fat-free mayonnaise.  What foods are not recommended?  The items listed below may not be a  complete list. Talk with your dietitian about what dietary choices are best for you.  Grains  Refined white flour and flour products, such as bread, pasta, snack foods, and cereals.  Vegetables  Canned vegetables. Frozen vegetables with butter or cream sauce.  Fruits  Fruits canned with syrup.  Meats and other protein foods  Fatty cuts of meat. Poultry with skin. Breaded or fried meat. Processed meats.  Dairy  Full-fat yogurt, cheese, or milk.  Beverages  Sweetened drinks, such as sweet iced tea and soda.  Fats and oils  Butter. Lard. Ghee.  Sweets and desserts  Baked goods, such as cake, cupcakes, pastries, cookies, and cheesecake.  Seasoning and other foods  Spice mixes with added salt. Ketchup. Barbecue sauce. Mayonnaise.  Summary  · To prevent diabetes from developing, you may need to make diet and other lifestyle changes to help control blood sugar, improve cholesterol levels, and manage your blood pressure.  · Set weight loss goals with the help of your health care team. It is recommended that most people with prediabetes lose 7 percent of their current body weight.  · Consider following a Mediterranean diet that includes plenty of fresh fruits and vegetables, whole grains, beans, nuts, seeds, fish, lean meat, low-fat dairy, and healthy oils.  This information is not intended to replace advice given to you by your health care provider. Make sure you discuss any questions you have with your health care provider.  Document Released: 09/12/2014 Document Revised: 07/02/2016 Document Reviewed: 07/02/2016  Elsevier Interactive Patient Education © 2019 Elsevier Inc.

## 2018-06-09 NOTE — Progress Notes (Signed)
Patient needs refills on all medications

## 2018-06-10 LAB — CMP14+EGFR
ALK PHOS: 61 IU/L (ref 39–117)
ALT: 14 IU/L (ref 0–44)
AST: 17 IU/L (ref 0–40)
Albumin/Globulin Ratio: 2.1 (ref 1.2–2.2)
Albumin: 4.4 g/dL (ref 3.8–4.8)
BILIRUBIN TOTAL: 0.5 mg/dL (ref 0.0–1.2)
BUN/Creatinine Ratio: 9 — ABNORMAL LOW (ref 10–24)
BUN: 31 mg/dL — ABNORMAL HIGH (ref 8–27)
CO2: 23 mmol/L (ref 20–29)
Calcium: 9.9 mg/dL (ref 8.6–10.2)
Chloride: 101 mmol/L (ref 96–106)
Creatinine, Ser: 3.54 mg/dL — ABNORMAL HIGH (ref 0.76–1.27)
GFR calc Af Amer: 20 mL/min/{1.73_m2} — ABNORMAL LOW (ref >59)
GFR calc non Af Amer: 17 mL/min/{1.73_m2} — ABNORMAL LOW (ref >59)
Globulin, Total: 2.1 g/dL (ref 1.5–4.5)
Glucose: 89 mg/dL (ref 65–99)
Potassium: 3.9 mmol/L (ref 3.5–5.2)
Sodium: 139 mmol/L (ref 134–144)
Total Protein: 6.5 g/dL (ref 6.0–8.5)

## 2018-06-10 LAB — CBC WITH DIFFERENTIAL/PLATELET
Basophils Absolute: 0 10*3/uL (ref 0.0–0.2)
Basos: 1 %
EOS (ABSOLUTE): 0.4 10*3/uL (ref 0.0–0.4)
Eos: 8 %
HEMOGLOBIN: 11.4 g/dL — AB (ref 13.0–17.7)
Hematocrit: 35 % — ABNORMAL LOW (ref 37.5–51.0)
Immature Grans (Abs): 0 10*3/uL (ref 0.0–0.1)
Immature Granulocytes: 0 %
Lymphocytes Absolute: 1.3 10*3/uL (ref 0.7–3.1)
Lymphs: 28 %
MCH: 26.3 pg — ABNORMAL LOW (ref 26.6–33.0)
MCHC: 32.6 g/dL (ref 31.5–35.7)
MCV: 81 fL (ref 79–97)
MONOCYTES: 8 %
Monocytes Absolute: 0.4 10*3/uL (ref 0.1–0.9)
Neutrophils Absolute: 2.5 10*3/uL (ref 1.4–7.0)
Neutrophils: 55 %
Platelets: 255 10*3/uL (ref 150–450)
RBC: 4.34 x10E6/uL (ref 4.14–5.80)
RDW: 13.5 % (ref 11.6–15.4)
WBC: 4.6 10*3/uL (ref 3.4–10.8)

## 2018-06-10 LAB — LIPID PANEL
CHOLESTEROL TOTAL: 138 mg/dL (ref 100–199)
Chol/HDL Ratio: 3.5 ratio (ref 0.0–5.0)
HDL: 40 mg/dL (ref >39)
LDL Calculated: 84 mg/dL (ref 0–99)
Triglycerides: 71 mg/dL (ref 0–149)
VLDL Cholesterol Cal: 14 mg/dL (ref 5–40)

## 2018-06-10 LAB — HEPATITIS C ANTIBODY (REFLEX): HCV Ab: <0.1 s/co ratio (ref 0.0–0.9)

## 2018-06-10 LAB — HIV ANTIBODY (ROUTINE TESTING W REFLEX): HIV Screen 4th Generation wRfx: NONREACTIVE

## 2018-06-10 LAB — HCV COMMENT:

## 2018-06-14 ENCOUNTER — Telehealth: Payer: Self-pay

## 2018-06-14 NOTE — Telephone Encounter (Signed)
Patient was called and results were given to patient's wife.

## 2018-06-14 NOTE — Telephone Encounter (Signed)
-----   Message from [name], MD sent at 06/10/2018 12:30 PM EST ----- Cholesterol is normal.  Kidney function is still abnormal compared to previous labs.  He should be receiving a call soon with his appointment from the nephrology clinic.

## 2018-06-20 ENCOUNTER — Other Ambulatory Visit: Payer: Self-pay | Admitting: Family Medicine

## 2018-06-20 DIAGNOSIS — E7800 Pure hypercholesterolemia, unspecified: Secondary | ICD-10-CM

## 2018-06-25 ENCOUNTER — Other Ambulatory Visit: Payer: Self-pay | Admitting: Nephrology

## 2018-06-25 ENCOUNTER — Other Ambulatory Visit (HOSPITAL_COMMUNITY): Payer: Self-pay | Admitting: Nephrology

## 2018-06-25 DIAGNOSIS — D649 Anemia, unspecified: Secondary | ICD-10-CM | POA: Diagnosis not present

## 2018-06-25 DIAGNOSIS — N184 Chronic kidney disease, stage 4 (severe): Secondary | ICD-10-CM | POA: Diagnosis not present

## 2018-06-25 DIAGNOSIS — N183 Chronic kidney disease, stage 3 unspecified: Secondary | ICD-10-CM

## 2018-06-25 DIAGNOSIS — N2581 Secondary hyperparathyroidism of renal origin: Secondary | ICD-10-CM | POA: Diagnosis not present

## 2018-06-25 DIAGNOSIS — E559 Vitamin D deficiency, unspecified: Secondary | ICD-10-CM | POA: Diagnosis not present

## 2018-06-25 DIAGNOSIS — I10 Essential (primary) hypertension: Secondary | ICD-10-CM | POA: Diagnosis not present

## 2018-07-14 ENCOUNTER — Ambulatory Visit (HOSPITAL_COMMUNITY)
Admission: RE | Admit: 2018-07-14 | Discharge: 2018-07-14 | Disposition: A | Discharge status: Home / Self Care | Payer: Medicare Other | Source: Ambulatory Visit | Attending: Nephrology | Admitting: Nephrology

## 2018-07-14 DIAGNOSIS — I10 Essential (primary) hypertension: Secondary | ICD-10-CM | POA: Diagnosis not present

## 2018-07-14 DIAGNOSIS — N184 Chronic kidney disease, stage 4 (severe): Secondary | ICD-10-CM | POA: Diagnosis not present

## 2018-07-14 DIAGNOSIS — R809 Proteinuria, unspecified: Secondary | ICD-10-CM | POA: Diagnosis not present

## 2018-07-14 DIAGNOSIS — D509 Iron deficiency anemia, unspecified: Secondary | ICD-10-CM | POA: Diagnosis not present

## 2018-07-14 DIAGNOSIS — E559 Vitamin D deficiency, unspecified: Secondary | ICD-10-CM | POA: Diagnosis not present

## 2018-07-14 DIAGNOSIS — D519 Vitamin B12 deficiency anemia, unspecified: Secondary | ICD-10-CM | POA: Diagnosis not present

## 2018-07-14 DIAGNOSIS — Z79899 Other long term (current) drug therapy: Secondary | ICD-10-CM | POA: Diagnosis not present

## 2018-07-14 DIAGNOSIS — N189 Chronic kidney disease, unspecified: Secondary | ICD-10-CM | POA: Diagnosis not present

## 2018-07-14 DIAGNOSIS — N183 Chronic kidney disease, stage 3 unspecified: Secondary | ICD-10-CM

## 2018-07-14 DIAGNOSIS — Z1159 Encounter for screening for other viral diseases: Secondary | ICD-10-CM | POA: Diagnosis not present

## 2018-09-06 ENCOUNTER — Encounter: Payer: Self-pay | Admitting: Family Medicine

## 2018-09-06 ENCOUNTER — Other Ambulatory Visit: Payer: Self-pay

## 2018-09-06 ENCOUNTER — Ambulatory Visit: Payer: Medicare Other | Attending: Family Medicine | Admitting: Family Medicine

## 2018-09-06 DIAGNOSIS — I10 Essential (primary) hypertension: Secondary | ICD-10-CM | POA: Diagnosis not present

## 2018-09-06 DIAGNOSIS — E7800 Pure hypercholesterolemia, unspecified: Secondary | ICD-10-CM | POA: Diagnosis not present

## 2018-09-06 DIAGNOSIS — N184 Chronic kidney disease, stage 4 (severe): Secondary | ICD-10-CM

## 2018-09-06 MED ORDER — LABETALOL HCL 300 MG PO TABS: ORAL_TABLET | ORAL | 1 refills | Status: DC

## 2018-09-06 MED ORDER — AMLODIPINE BESYLATE 10 MG PO TABS: 10.0000 mg | ORAL_TABLET | Freq: Every day | ORAL | 1 refills | Status: DC

## 2018-09-06 MED ORDER — SILDENAFIL CITRATE 50 MG PO TABS: 50.0000 mg | ORAL_TABLET | Freq: Every day | ORAL | 1 refills | Status: DC | PRN

## 2018-09-06 MED ORDER — DOXAZOSIN MESYLATE 8 MG PO TABS: 8.0000 mg | ORAL_TABLET | Freq: Every day | ORAL | 1 refills | Status: DC

## 2018-09-06 MED ORDER — PRAVASTATIN SODIUM 40 MG PO TABS: 40.0000 mg | ORAL_TABLET | Freq: Every day | ORAL | 1 refills | Status: DC

## 2018-09-06 MED ORDER — CALCITRIOL 0.5 MCG PO CAPS: 0.5000 ug | ORAL_CAPSULE | Freq: Every day | ORAL | 1 refills | Status: DC

## 2018-09-06 NOTE — Progress Notes (Signed)
Virtual Visit via Telephone Note  I connected with Brent Jimenez, on 09/06/2018 at 9:05 AM by telephone and verified that I am speaking with the correct person using two identifiers.   Consent: I discussed the limitations, risks, security and privacy concerns of performing an evaluation and management service by telephone and the availability of in person appointments. I also discussed with the patient that there may be a patient responsible charge related to this service. The patient expressed understanding and agreed to proceed.   Location of Patient: Home  Location of Provider: Clinic   Persons participating in Telemedicine visit: Jayven Naill  [facility] Ozzie Hoyle Dr. Felecia Shelling     History of Present Illness: Brent Jimenez  is a 63 year old male with a history of stroke with residual expressive dysarthria, hypertension, stage IV chronic kidney disease (followed by nephrology), prediabetes, hypercholesterolemia who comes into the clinic today for follow-up visit. Prediabetes is diet controlled.   At his last visit he had requested a second opinion with regards to nephrology and was able to see a nephrologist at [facility].  I do not have those records available to me.  He seemed satisfied with his visit and had labs however he is unsure of his lab results. His wife is requesting a prescription for Viagra for him as he has noticed difficulty with erection. He is otherwise doing well and denies chest pains, dyspnea. They have not been checking his blood pressures at home but she promises to begin doing so. He has no additional concerns today.  Past Medical History:  Diagnosis Date  . Brain bleed ([place]) 2007   expressive asphasia  . Chronic kidney disease   . Hypercholesteremia   . Hypertension   . Stroke [facility])    No Known Allergies  Current Outpatient Medications on File Prior to Visit  Medication Sig Dispense Refill  . amLODipine (NORVASC) 10 MG  tablet Take 1 tablet (10 mg total) by mouth daily. 90 tablet 1  . calcitRIOL (ROCALTROL) 0.5 MCG capsule Take 1 capsule (0.5 mcg total) by mouth daily. 90 capsule 1  . doxazosin (CARDURA) 8 MG tablet Take 1 tablet (8 mg total) by mouth daily. 90 tablet 1  . labetalol (NORMODYNE) 300 MG tablet TAKE 1 TABLET BY MOUTH 2 TIMES DAILY. 180 tablet 1  . loratadine (CLARITIN) 10 MG tablet Take 1 tablet (10 mg total) by mouth daily. 15 tablet 0  . pravastatin (PRAVACHOL) 40 MG tablet Take 1 tablet (40 mg total) by mouth daily. 90 tablet 1   No current facility-administered medications on file prior to visit.     Observations/Objective: Awake, alert, oriented x3 Not in acute distress  Assessment and Plan: 1. Essential hypertension, benign Advised to check blood pressures at home Counseled on blood pressure goal of less than 130/80, low-sodium, DASH diet, medication compliance, 150 minutes of moderate intensity exercise per week. Discussed medication compliance, adverse effects. - amLODipine (NORVASC) 10 MG tablet; Take 1 tablet (10 mg total) by mouth daily.  Dispense: 90 tablet; Refill: 1 - labetalol (NORMODYNE) 300 MG tablet; TAKE 1 TABLET BY MOUTH 2 TIMES DAILY.  Dispense: 180 tablet; Refill: 1  2. Stage 4 chronic kidney disease ([facility]) Likely hypertensive nephropathy Followed by nephrology Avoid nephrotoxins - calcitRIOL (ROCALTROL) 0.5 MCG capsule; Take 1 capsule (0.5 mcg total) by mouth daily.  Dispense: 90 capsule; Refill: 1  3. Hypercholesteremia Stable - pravastatin (PRAVACHOL) 40 MG tablet; Take 1 tablet (40 mg total) by mouth daily.  Dispense: 90 tablet; Refill: 1  Follow Up Instructions: Return in about 3 months (around 12/06/2018) for Follow-up, call for an appointment.    I discussed the assessment and treatment plan with the patient. The patient was provided an opportunity to ask questions and all were answered. The patient agreed with the plan and demonstrated an understanding  of the instructions.   The patient was advised to call back or seek an in-person evaluation if the symptoms worsen or if the condition fails to improve as anticipated.     I provided 15 minutes total of non-face-to-face time during this encounter including median intraservice time, reviewing previous notes, labs, imaging, medications and explaining diagnosis and management.     [name], MD, FAAFP. [facility] [place], [place]   09/06/2018, 9:05 AM

## 2018-09-06 NOTE — Progress Notes (Signed)
Patient has been called and DOB has been verified. Patient has been screened and transferred to PCP to start phone visit.  C/C: Hypertention  Refills: none

## 2018-11-30 ENCOUNTER — Other Ambulatory Visit: Payer: Self-pay | Admitting: Family Medicine

## 2018-11-30 DIAGNOSIS — I10 Essential (primary) hypertension: Secondary | ICD-10-CM

## 2018-11-30 DIAGNOSIS — N184 Chronic kidney disease, stage 4 (severe): Secondary | ICD-10-CM

## 2018-12-07 ENCOUNTER — Other Ambulatory Visit: Payer: Self-pay

## 2018-12-07 ENCOUNTER — Ambulatory Visit: Payer: Medicare Other | Attending: Family Medicine | Admitting: Family Medicine

## 2018-12-07 DIAGNOSIS — E7800 Pure hypercholesterolemia, unspecified: Secondary | ICD-10-CM | POA: Diagnosis not present

## 2018-12-07 DIAGNOSIS — N184 Chronic kidney disease, stage 4 (severe): Secondary | ICD-10-CM | POA: Diagnosis not present

## 2018-12-07 DIAGNOSIS — I10 Essential (primary) hypertension: Secondary | ICD-10-CM

## 2018-12-07 MED ORDER — AMLODIPINE BESYLATE 10 MG PO TABS: 10.0000 mg | ORAL_TABLET | Freq: Every day | ORAL | 1 refills | Status: DC

## 2018-12-07 MED ORDER — PRAVASTATIN SODIUM 40 MG PO TABS: 40.0000 mg | ORAL_TABLET | Freq: Every day | ORAL | 1 refills | Status: DC

## 2018-12-07 MED ORDER — DOXAZOSIN MESYLATE 8 MG PO TABS: 8.0000 mg | ORAL_TABLET | Freq: Every day | ORAL | 1 refills | Status: DC

## 2018-12-07 MED ORDER — LABETALOL HCL 300 MG PO TABS: ORAL_TABLET | ORAL | 1 refills | Status: DC

## 2018-12-07 MED ORDER — CALCITRIOL 0.5 MCG PO CAPS: 0.5000 ug | ORAL_CAPSULE | Freq: Every day | ORAL | 1 refills | Status: DC

## 2018-12-07 NOTE — Progress Notes (Signed)
Patient has been called and DOB has been verified. Patient has been screened and transferred to PCP to start phone visit.     

## 2018-12-07 NOTE — Progress Notes (Signed)
Virtual Visit via Telephone Note  I connected with Brent Jimenez, on 12/07/2018 at 9:42 AM by telephone due to the COVID-19 pandemic and verified that I am speaking with the correct person using two identifiers.   Consent: I discussed the limitations, risks, security and privacy concerns of performing an evaluation and management service by telephone and the availability of in person appointments. I also discussed with the patient that there may be a patient responsible charge related to this service. The patient expressed understanding and agreed to proceed.   Location of Patient: Home  Location of Provider: Clinic   Persons participating in Telemedicine visit: Nelda Marseille Farrington-CMA Dr. Wayland Denis - Spouse    History of Present Illness: Brent Jimenez  is a 63 year old male with a history of stroke with residual expressive dysarthria, hypertension, stage IV chronic kidney disease (followed by nephrology), prediabetes, hypercholesterolemia who comes into the clinic today for follow-up visit. Prediabetes is diet controlled. He has no concerns today and is doing well on his medication. Yet to have an appointment with his Nephrologist. Denies chest pain, dyspnea.   Past Medical History:  Diagnosis Date  . Brain bleed ([place]) 2007   expressive asphasia  . Chronic kidney disease   . Hypercholesteremia   . Hypertension   . Stroke [facility])    No Known Allergies  Current Outpatient Medications on File Prior to Visit  Medication Sig Dispense Refill  . amLODipine (NORVASC) 10 MG tablet Take 1 tablet (10 mg total) by mouth daily. 90 tablet 1  . calcitRIOL (ROCALTROL) 0.5 MCG capsule Take 1 capsule (0.5 mcg total) by mouth daily. 90 capsule 1  . doxazosin (CARDURA) 8 MG tablet Take 1 tablet (8 mg total) by mouth daily. 90 tablet 1  . labetalol (NORMODYNE) 300 MG tablet TAKE 1 TABLET BY MOUTH 2 TIMES DAILY. 180 tablet 1  . loratadine (CLARITIN) 10 MG tablet Take 1  tablet (10 mg total) by mouth daily. 15 tablet 0  . pravastatin (PRAVACHOL) 40 MG tablet Take 1 tablet (40 mg total) by mouth daily. 90 tablet 1  . sildenafil (VIAGRA) 50 MG tablet Take 1 tablet (50 mg total) by mouth daily as needed for erectile dysfunction. At least 24 hours between doses 10 tablet 1   No current facility-administered medications on file prior to visit.     Observations/Objective: AAOx3 Not in acute distress  CMP Latest Ref Rng & Units 06/09/2018 05/13/2017 11/07/2015  Glucose 65 - 99 mg/dL 89 78 96  BUN 8 - 27 mg/dL 31(H) 28(H) 29(H)  Creatinine 0.76 - 1.27 mg/dL 3.54(H) 3.49(H) 3.44(H)  Sodium 134 - 144 mmol/L 139 144 139  Potassium 3.5 - 5.2 mmol/L 3.9 4.0 3.7  Chloride 96 - 106 mmol/L 101 106 105  CO2 20 - 29 mmol/L 23 20 25   Calcium 8.6 - 10.2 mg/dL 9.9 9.1 9.3  Total Protein 6.0 - 8.5 g/dL 6.5 6.8 7.0  Total Bilirubin 0.0 - 1.2 mg/dL 0.5 0.4 0.7  Alkaline Phos 39 - 117 IU/L 61 67 64  AST 0 - 40 IU/L 17 18 17   ALT 0 - 44 IU/L 14 20 13     Lipid Panel     Component Value Date/Time   CHOL 138 06/09/2018 1132   TRIG 71 06/09/2018 1132   HDL 40 06/09/2018 1132   CHOLHDL 3.5 06/09/2018 1132   CHOLHDL 3.7 11/07/2015 1119   VLDL 20 11/07/2015 1119   LDLCALC 84 06/09/2018 1132   LDLDIRECT 97 10/23/2009 2324  Lab Results  Component Value Date   HGBA1C 5.8 06/09/2018     Assessment and Plan: 1. Stage 4 chronic kidney disease ([place]) Followed by Nephrology Advised to call Nephrology office to obtain an appointment - calcitRIOL (ROCALTROL) 0.5 MCG capsule; Take 1 capsule (0.5 mcg total) by mouth daily.  Dispense: 90 capsule; Refill: 1  2. Essential hypertension, benign Stable Counseled on blood pressure goal of less than 130/80, low-sodium, DASH diet, medication compliance, 150 minutes of moderate intensity exercise per week. Discussed medication compliance, adverse effects. - amLODipine (NORVASC) 10 MG tablet; Take 1 tablet (10 mg total) by mouth daily.   Dispense: 90 tablet; Refill: 1 - labetalol (NORMODYNE) 300 MG tablet; TAKE 1 TABLET BY MOUTH 2 TIMES DAILY.  Dispense: 180 tablet; Refill: 1  3. Hypercholesteremia Controlled - pravastatin (PRAVACHOL) 40 MG tablet; Take 1 tablet (40 mg total) by mouth daily.  Dispense: 90 tablet; Refill: 1   Follow Up Instructions: 3 months   I discussed the assessment and treatment plan with the patient. The patient was provided an opportunity to ask questions and all were answered. The patient agreed with the plan and demonstrated an understanding of the instructions.   The patient was advised to call back or seek an in-person evaluation if the symptoms worsen or if the condition fails to improve as anticipated.     I provided 12 minutes total of non-face-to-face time during this encounter including median intraservice time, reviewing previous notes, labs, imaging, medications, management and patient verbalized understanding.     [name], MD, FAAFP. [facility] and [place], [place]   12/07/2018, 9:42 AM

## 2019-01-11 ENCOUNTER — Other Ambulatory Visit: Payer: Self-pay

## 2019-01-11 ENCOUNTER — Encounter: Payer: Self-pay | Admitting: Gastroenterology

## 2019-01-11 ENCOUNTER — Telehealth: Payer: Self-pay | Admitting: Family Medicine

## 2019-01-11 DIAGNOSIS — Z1211 Encounter for screening for malignant neoplasm of colon: Secondary | ICD-10-CM

## 2019-01-11 NOTE — Telephone Encounter (Signed)
New Message   Pt's wife is calling to get another referral for a colonoscopy for the pt. She states the previous one expired during the pandemic

## 2019-01-11 NOTE — Telephone Encounter (Signed)
Referral has been placed. 

## 2019-01-27 DIAGNOSIS — Z79899 Other long term (current) drug therapy: Secondary | ICD-10-CM | POA: Diagnosis not present

## 2019-01-27 DIAGNOSIS — E559 Vitamin D deficiency, unspecified: Secondary | ICD-10-CM | POA: Diagnosis not present

## 2019-01-27 DIAGNOSIS — N184 Chronic kidney disease, stage 4 (severe): Secondary | ICD-10-CM | POA: Diagnosis not present

## 2019-01-27 DIAGNOSIS — I10 Essential (primary) hypertension: Secondary | ICD-10-CM | POA: Diagnosis not present

## 2019-01-27 DIAGNOSIS — D649 Anemia, unspecified: Secondary | ICD-10-CM | POA: Diagnosis not present

## 2019-01-27 DIAGNOSIS — R809 Proteinuria, unspecified: Secondary | ICD-10-CM | POA: Diagnosis not present

## 2019-01-31 ENCOUNTER — Other Ambulatory Visit: Payer: Self-pay

## 2019-01-31 ENCOUNTER — Ambulatory Visit (AMBULATORY_SURGERY_CENTER): Payer: Self-pay | Admitting: *Deleted

## 2019-01-31 VITALS — Temp 96.6°F | Ht 76.0 in | Wt 237.8 lb

## 2019-01-31 DIAGNOSIS — Z1211 Encounter for screening for malignant neoplasm of colon: Secondary | ICD-10-CM

## 2019-01-31 MED ORDER — PEG 3350-KCL-NA BICARB-NACL 420 G PO SOLR: 4000.0000 mL | Freq: Once | ORAL | 0 refills | Status: AC

## 2019-01-31 NOTE — Progress Notes (Signed)

## 2019-02-01 DIAGNOSIS — N184 Chronic kidney disease, stage 4 (severe): Secondary | ICD-10-CM | POA: Diagnosis not present

## 2019-02-01 DIAGNOSIS — R809 Proteinuria, unspecified: Secondary | ICD-10-CM | POA: Diagnosis not present

## 2019-02-01 DIAGNOSIS — I10 Essential (primary) hypertension: Secondary | ICD-10-CM | POA: Diagnosis not present

## 2019-02-03 DIAGNOSIS — N184 Chronic kidney disease, stage 4 (severe): Secondary | ICD-10-CM

## 2019-02-03 DIAGNOSIS — Z8673 Personal history of transient ischemic attack (TIA), and cerebral infarction without residual deficits: Secondary | ICD-10-CM

## 2019-02-03 DIAGNOSIS — I6930 Unspecified sequelae of cerebral infarction: Secondary | ICD-10-CM

## 2019-02-03 DIAGNOSIS — I120 Hypertensive chronic kidney disease with stage 5 chronic kidney disease or end stage renal disease: Secondary | ICD-10-CM | POA: Insufficient documentation

## 2019-02-03 DIAGNOSIS — N2581 Secondary hyperparathyroidism of renal origin: Secondary | ICD-10-CM | POA: Insufficient documentation

## 2019-02-03 HISTORY — DX: Hypercalcemia: E83.52

## 2019-02-03 HISTORY — DX: Personal history of transient ischemic attack (TIA), and cerebral infarction without residual deficits: Z86.73

## 2019-02-03 HISTORY — DX: Chronic kidney disease, stage 4 (severe): N18.4

## 2019-02-03 HISTORY — DX: Unspecified sequelae of cerebral infarction: I69.30

## 2019-02-11 ENCOUNTER — Telehealth: Payer: Self-pay | Admitting: Gastroenterology

## 2019-02-11 NOTE — Telephone Encounter (Signed)
Pt returned call and answered “No” to all questions.  ° °

## 2019-02-11 NOTE — Telephone Encounter (Signed)
°  Covid-19 Screening Questions        Do you now or have you had a fever in the last 14 days?         Do you have any respiratory symptoms of shortness of breath or cough now or in the last 14 days?        Do you have any family members or close contacts with diagnosed or suspected Covid-19 in the past 14 days?         Have you been tested for Covid-19 and found to be positive?        Please make pt aware that care partner may wait in the car or come up to the lobby during the procedure but will need to provide their own mask.

## 2019-02-14 ENCOUNTER — Ambulatory Visit (AMBULATORY_SURGERY_CENTER): Payer: Medicare Other | Admitting: Gastroenterology

## 2019-02-14 ENCOUNTER — Other Ambulatory Visit: Payer: Self-pay

## 2019-02-14 ENCOUNTER — Encounter: Payer: Self-pay | Admitting: Gastroenterology

## 2019-02-14 VITALS — BP 133/85 | HR 62 | Temp 97.8°F | Resp 13 | Ht 76.0 in | Wt 237.8 lb

## 2019-02-14 DIAGNOSIS — Z1211 Encounter for screening for malignant neoplasm of colon: Secondary | ICD-10-CM | POA: Diagnosis not present

## 2019-02-14 DIAGNOSIS — D122 Benign neoplasm of ascending colon: Secondary | ICD-10-CM | POA: Diagnosis not present

## 2019-02-14 MED ORDER — SODIUM CHLORIDE 0.9 % IV SOLN: 500.0000 mL | Freq: Once | INTRAVENOUS | Status: DC

## 2019-02-14 NOTE — Progress Notes (Signed)
Called to room to assist during endoscopic procedure.  Patient ID and intended procedure confirmed with present staff. Received instructions for my participation in the procedure from the performing physician.  

## 2019-02-14 NOTE — Op Note (Signed)
[facility] Patient Name: Brent Jimenez Procedure Date: 02/14/2019 11:21 AM MRN: [id number] Endoscopist: Remo Lipps P. Havery Moros , MD Age: 63 Referring MD:  Date of Birth: 10/18/1955 Gender: Male Account #: [id number] Procedure:                Colonoscopy Indications:              Screening for colorectal malignant neoplasm, This                            is the patient's first colonoscopy Medicines:                Monitored Anesthesia Care Procedure:                Pre-Anesthesia Assessment:                           - Prior to the procedure, a History and Physical                            was performed, and patient medications and                            allergies were reviewed. The patient's tolerance of                            previous anesthesia was also reviewed. The risks                            and benefits of the procedure and the sedation                            options and risks were discussed with the patient.                            All questions were answered, and informed consent                            was obtained. Prior Anticoagulants: The patient has                            taken no previous anticoagulant or antiplatelet                            agents. ASA Grade Assessment: III - A patient with                            severe systemic disease. After reviewing the risks                            and benefits, the patient was deemed in                            satisfactory condition to undergo the procedure.  After obtaining informed consent, the colonoscope                            was passed under direct vision. Throughout the                            procedure, the patient's blood pressure, pulse, and                            oxygen saturations were monitored continuously. The                            Colonoscope was introduced through the anus and                            advanced to the  the cecum, identified by                            appendiceal orifice and ileocecal valve. The                            colonoscopy was performed without difficulty. The                            patient tolerated the procedure well. The quality                            of the bowel preparation was adequate. The                            ileocecal valve, appendiceal orifice, and rectum                            were photographed. Scope In: 11:28:26 AM Scope Out: 11:47:49 AM Scope Withdrawal Time: 0 hours 16 minutes 59 seconds  Total Procedure Duration: 0 hours 19 minutes 23 seconds  Findings:                 The perianal and digital rectal examinations were                            normal.                           A 3 mm polyp was found in the ascending colon. The                            polyp was sessile, best seen in the retroflexed                            position. The polyp was removed with a cold snare.                            Resection and retrieval were complete.  A few small-mouthed diverticula were found in the                            sigmoid colon.                           The exam was otherwise without abnormality. Complications:            No immediate complications. Estimated blood loss:                            Minimal. Estimated Blood Loss:     Estimated blood loss was minimal. Impression:               - One 3 mm polyp in the ascending colon, removed                            with a cold snare. Resected and retrieved.                           - Diverticulosis in the sigmoid colon.                           - The examination was otherwise normal. Recommendation:           - Patient has a contact number available for                            emergencies. The signs and symptoms of potential                            delayed complications were discussed with the                            patient. Return to normal  activities tomorrow.                            Written discharge instructions were provided to the                            patient.                           - Resume previous diet.                           - Continue present medications.                           - Await pathology results. Remo Lipps P. [place]bruster, MD 02/14/2019 11:51:27 AM This report has been signed electronically.

## 2019-02-14 NOTE — Progress Notes (Signed)
A and O x3. Report to RN. Tolerated MAC anesthesia well.

## 2019-02-14 NOTE — Patient Instructions (Signed)
YOU HAD AN ENDOSCOPIC PROCEDURE TODAY AT THE [facility]:   Refer to the procedure report that was given to you for any specific questions about what was found during the examination.  If the procedure report does not answer your questions, please call your gastroenterologist to clarify.  If you requested that your care partner not be given the details of your procedure findings, then the procedure report has been included in a sealed envelope for you to review at your convenience later.  YOU SHOULD EXPECT: Some feelings of bloating in the abdomen. Passage of more gas than usual.  Walking can help get rid of the air that was put into your GI tract during the procedure and reduce the bloating. If you had a lower endoscopy (such as a colonoscopy or flexible sigmoidoscopy) you may notice spotting of blood in your stool or on the toilet paper. If you underwent a bowel prep for your procedure, you may not have a normal bowel movement for a few days.  Please Note:  You might notice some irritation and congestion in your nose or some drainage.  This is from the oxygen used during your procedure.  There is no need for concern and it should clear up in a day or so.  SYMPTOMS TO REPORT IMMEDIATELY:   Following lower endoscopy (colonoscopy or flexible sigmoidoscopy):  Excessive amounts of blood in the stool  Significant tenderness or worsening of abdominal pains  Swelling of the abdomen that is new, acute  Fever of 100F or higher   For urgent or emergent issues, a gastroenterologist can be reached at any hour by calling [phone].   DIET:  We do recommend a small meal at first, but then you may proceed to your regular diet.  Drink plenty of fluids but you should avoid alcoholic beverages for 24 hours.  MEDICATIONS: Continue present medications.  Please see handouts given to you by your recovery nurse.  ACTIVITY:  You should plan to take it easy for the rest of today and you should  NOT DRIVE or use heavy machinery until tomorrow (because of the sedation medicines used during the test).    FOLLOW UP: Our staff will call the number listed on your records 48-72 hours following your procedure to check on you and address any questions or concerns that you may have regarding the information given to you following your procedure. If we do not reach you, we will leave a message.  We will attempt to reach you two times.  During this call, we will ask if you have developed any symptoms of COVID 19. If you develop any symptoms (ie: fever, flu-like symptoms, shortness of breath, cough etc.) before then, please call [phone].  If you test positive for Covid 19 in the 2 weeks post procedure, please call and report this information to us.    If any biopsies were taken you will be contacted by phone or by letter within the next 1-3 weeks.  Please call us at [phone] if you have not heard about the biopsies in 3 weeks.   Thank you for allowing us to provide for your healthcare needs today.   SIGNATURES/CONFIDENTIALITY: You and/or your care partner have signed paperwork which will be entered into your electronic medical record.  These signatures attest to the fact that that the information above on your After Visit Summary has been reviewed and is understood.  Full responsibility of the confidentiality of this discharge information lies with you and/or   your care-partner. 

## 2019-02-14 NOTE — Progress Notes (Signed)
Pt's states no medical or surgical changes since previsit or office visit.per pt's wife ,Marliss Coots

## 2019-02-16 ENCOUNTER — Telehealth: Payer: Self-pay

## 2019-02-16 NOTE — Telephone Encounter (Signed)
Attempted to reach patient for post-procedure f/u call. No answer. Left message that we will make another attempt to reach him later today and for him to please not hesitate to call us if he has any questions/concerns regarding his care. 

## 2019-02-16 NOTE — Telephone Encounter (Signed)
Second post procedure follow up call, no answer 

## 2019-03-23 DIAGNOSIS — I10 Essential (primary) hypertension: Secondary | ICD-10-CM | POA: Diagnosis not present

## 2019-03-23 DIAGNOSIS — N184 Chronic kidney disease, stage 4 (severe): Secondary | ICD-10-CM | POA: Diagnosis not present

## 2019-03-23 DIAGNOSIS — E559 Vitamin D deficiency, unspecified: Secondary | ICD-10-CM | POA: Diagnosis not present

## 2019-03-23 DIAGNOSIS — D649 Anemia, unspecified: Secondary | ICD-10-CM | POA: Diagnosis not present

## 2019-03-23 DIAGNOSIS — Z79899 Other long term (current) drug therapy: Secondary | ICD-10-CM | POA: Diagnosis not present

## 2019-03-23 DIAGNOSIS — R809 Proteinuria, unspecified: Secondary | ICD-10-CM | POA: Diagnosis not present

## 2019-03-30 DIAGNOSIS — N184 Chronic kidney disease, stage 4 (severe): Secondary | ICD-10-CM | POA: Insufficient documentation

## 2019-03-31 DIAGNOSIS — N189 Chronic kidney disease, unspecified: Secondary | ICD-10-CM | POA: Diagnosis not present

## 2019-03-31 DIAGNOSIS — N184 Chronic kidney disease, stage 4 (severe): Secondary | ICD-10-CM | POA: Diagnosis not present

## 2019-03-31 DIAGNOSIS — R809 Proteinuria, unspecified: Secondary | ICD-10-CM | POA: Diagnosis not present

## 2019-03-31 DIAGNOSIS — D631 Anemia in chronic kidney disease: Secondary | ICD-10-CM | POA: Diagnosis not present

## 2019-03-31 DIAGNOSIS — E211 Secondary hyperparathyroidism, not elsewhere classified: Secondary | ICD-10-CM | POA: Diagnosis not present

## 2019-05-19 ENCOUNTER — Other Ambulatory Visit: Payer: Self-pay | Admitting: Family Medicine

## 2019-05-20 NOTE — Telephone Encounter (Signed)
Please fill if appropriate.  

## 2019-07-20 DIAGNOSIS — E559 Vitamin D deficiency, unspecified: Secondary | ICD-10-CM | POA: Diagnosis not present

## 2019-07-20 DIAGNOSIS — N184 Chronic kidney disease, stage 4 (severe): Secondary | ICD-10-CM | POA: Diagnosis not present

## 2019-07-20 DIAGNOSIS — Z79899 Other long term (current) drug therapy: Secondary | ICD-10-CM | POA: Diagnosis not present

## 2019-07-20 DIAGNOSIS — D631 Anemia in chronic kidney disease: Secondary | ICD-10-CM | POA: Diagnosis not present

## 2019-07-20 DIAGNOSIS — R809 Proteinuria, unspecified: Secondary | ICD-10-CM | POA: Diagnosis not present

## 2019-07-27 DIAGNOSIS — N189 Chronic kidney disease, unspecified: Secondary | ICD-10-CM | POA: Diagnosis not present

## 2019-07-27 DIAGNOSIS — D631 Anemia in chronic kidney disease: Secondary | ICD-10-CM | POA: Diagnosis not present

## 2019-07-27 DIAGNOSIS — E211 Secondary hyperparathyroidism, not elsewhere classified: Secondary | ICD-10-CM | POA: Diagnosis not present

## 2019-07-27 DIAGNOSIS — Z79899 Other long term (current) drug therapy: Secondary | ICD-10-CM | POA: Diagnosis not present

## 2019-07-27 DIAGNOSIS — N184 Chronic kidney disease, stage 4 (severe): Secondary | ICD-10-CM | POA: Diagnosis not present

## 2019-07-27 DIAGNOSIS — R809 Proteinuria, unspecified: Secondary | ICD-10-CM | POA: Diagnosis not present

## 2019-09-19 DIAGNOSIS — Z79899 Other long term (current) drug therapy: Secondary | ICD-10-CM | POA: Diagnosis not present

## 2019-09-19 DIAGNOSIS — D631 Anemia in chronic kidney disease: Secondary | ICD-10-CM | POA: Diagnosis not present

## 2019-09-19 DIAGNOSIS — R809 Proteinuria, unspecified: Secondary | ICD-10-CM | POA: Diagnosis not present

## 2019-09-19 DIAGNOSIS — N189 Chronic kidney disease, unspecified: Secondary | ICD-10-CM | POA: Diagnosis not present

## 2019-09-19 DIAGNOSIS — N184 Chronic kidney disease, stage 4 (severe): Secondary | ICD-10-CM | POA: Diagnosis not present

## 2019-09-19 DIAGNOSIS — E211 Secondary hyperparathyroidism, not elsewhere classified: Secondary | ICD-10-CM | POA: Diagnosis not present

## 2019-09-23 DIAGNOSIS — N189 Chronic kidney disease, unspecified: Secondary | ICD-10-CM | POA: Diagnosis not present

## 2019-09-23 DIAGNOSIS — D631 Anemia in chronic kidney disease: Secondary | ICD-10-CM | POA: Diagnosis not present

## 2019-09-23 DIAGNOSIS — I120 Hypertensive chronic kidney disease with stage 5 chronic kidney disease or end stage renal disease: Secondary | ICD-10-CM | POA: Diagnosis not present

## 2019-09-23 DIAGNOSIS — N185 Chronic kidney disease, stage 5: Secondary | ICD-10-CM | POA: Diagnosis not present

## 2019-09-23 DIAGNOSIS — R809 Proteinuria, unspecified: Secondary | ICD-10-CM | POA: Diagnosis not present

## 2019-09-23 DIAGNOSIS — E211 Secondary hyperparathyroidism, not elsewhere classified: Secondary | ICD-10-CM | POA: Diagnosis not present

## 2019-09-29 ENCOUNTER — Telehealth: Payer: Self-pay | Admitting: Family Medicine

## 2019-09-29 NOTE — Telephone Encounter (Signed)
Will route to PCP for review. 

## 2019-09-29 NOTE — Telephone Encounter (Signed)
Requested for listed medication to be refilled and sent to [facility] and [place]  sildenafil (VIAGRA) 50 MG tablet [[id number]] DISCONTINUED

## 2019-09-30 MED ORDER — SILDENAFIL CITRATE 50 MG PO TABS: 50.0000 mg | ORAL_TABLET | Freq: Every day | ORAL | 1 refills | Status: DC | PRN

## 2019-09-30 NOTE — Telephone Encounter (Signed)
Done

## 2019-11-10 ENCOUNTER — Other Ambulatory Visit: Payer: Self-pay | Admitting: Family Medicine

## 2019-11-10 DIAGNOSIS — I10 Essential (primary) hypertension: Secondary | ICD-10-CM

## 2019-11-10 DIAGNOSIS — E7800 Pure hypercholesterolemia, unspecified: Secondary | ICD-10-CM

## 2019-11-10 DIAGNOSIS — N184 Chronic kidney disease, stage 4 (severe): Secondary | ICD-10-CM

## 2019-11-10 NOTE — Telephone Encounter (Signed)
Courtesy refill. Message sent to make appointment.

## 2019-11-10 NOTE — Telephone Encounter (Signed)
Requested Prescriptions  Pending Prescriptions Disp Refills   calcitRIOL (ROCALTROL) 0.5 MCG capsule [Pharmacy Med Name: CALCITRIOL 0.5MCG CAPSULES] 90 capsule 1    Sig: TAKE 1 CAPSULE(0.5 MCG) BY MOUTH DAILY     Endocrinology:  Vitamins - Vitamin D Supplementation Failed - 11/10/2019 11:44 AM      Failed - 50,000 IU strengths are not delegated      Failed - Ca in normal range and within 360 days    Calcium  Date Value Ref Range Status  06/09/2018 9.9 8.6 - 10.2 mg/dL Final   Calcium, Total (PTH)  Date Value Ref Range Status  06/20/2008 9.7 8.4 - 10.5 mg/dL Final         Failed - Phosphate in normal range and within 360 days    Phosphorus  Date Value Ref Range Status  12/18/2008 3.8 2.3 - 4.6 mg/dL Final         Failed - Vitamin D in normal range and within 360 days    Vit D, 25-Hydroxy  Date Value Ref Range Status  11/07/2015 40 30 - 100 ng/mL Final    Comment:    Vitamin D Status           25-OH Vitamin D        Deficiency                <20 ng/mL        Insufficiency         20 - 29 ng/mL        Optimal             > or = 30 ng/mL   For 25-OH Vitamin D testing on patients on D2-supplementation and patients for whom quantitation of D2 and D3 fractions is required, the QuestAssureD 25-OH VIT D, (D2,D3), LC/MS/MS is recommended: order code [id number] (patients > 2 yrs).          Failed - Valid encounter within last 12 months    Recent Outpatient Visits          11 months ago Stage 4 chronic kidney disease ([place])   [facility] [name], MD   1 year ago Essential hypertension, benign   [facility] [name], MD   1 year ago Screening for colon cancer   [place] [name], MD   2 years ago [place], Deborah B, MD   2 years ago Stage 3 chronic kidney disease [facility])   [facility]  [name], MD             Phone call to pt.  Spoke with his wife, as pt. Not avail. (wife is on [place])  Advised that a refill request was rec'd from [facility] for Calcitriol, and pt. Needs to schedule a f/u appt. With Dr. Margarita Rana.  Wife stated the pt. Goes to the Kidney doctor every 3 mos., and his dosage has been changed on this medication.  Wife stated she will call the Kidney doctor re: refill request.  Wife stated "I'm on top of it", then disconnected the call.

## 2019-11-24 DIAGNOSIS — E211 Secondary hyperparathyroidism, not elsewhere classified: Secondary | ICD-10-CM | POA: Diagnosis not present

## 2019-11-24 DIAGNOSIS — I120 Hypertensive chronic kidney disease with stage 5 chronic kidney disease or end stage renal disease: Secondary | ICD-10-CM | POA: Diagnosis not present

## 2019-11-24 DIAGNOSIS — D631 Anemia in chronic kidney disease: Secondary | ICD-10-CM | POA: Diagnosis not present

## 2019-11-24 DIAGNOSIS — N185 Chronic kidney disease, stage 5: Secondary | ICD-10-CM | POA: Diagnosis not present

## 2019-11-24 DIAGNOSIS — R809 Proteinuria, unspecified: Secondary | ICD-10-CM | POA: Diagnosis not present

## 2019-11-24 DIAGNOSIS — N189 Chronic kidney disease, unspecified: Secondary | ICD-10-CM | POA: Diagnosis not present

## 2019-11-30 DIAGNOSIS — D631 Anemia in chronic kidney disease: Secondary | ICD-10-CM | POA: Diagnosis not present

## 2019-11-30 DIAGNOSIS — R809 Proteinuria, unspecified: Secondary | ICD-10-CM | POA: Diagnosis not present

## 2019-11-30 DIAGNOSIS — N184 Chronic kidney disease, stage 4 (severe): Secondary | ICD-10-CM | POA: Diagnosis not present

## 2019-11-30 DIAGNOSIS — N189 Chronic kidney disease, unspecified: Secondary | ICD-10-CM | POA: Diagnosis not present

## 2019-11-30 DIAGNOSIS — E211 Secondary hyperparathyroidism, not elsewhere classified: Secondary | ICD-10-CM | POA: Diagnosis not present

## 2019-12-10 ENCOUNTER — Other Ambulatory Visit: Payer: Self-pay | Admitting: Family Medicine

## 2019-12-10 DIAGNOSIS — E7800 Pure hypercholesterolemia, unspecified: Secondary | ICD-10-CM

## 2019-12-10 DIAGNOSIS — I10 Essential (primary) hypertension: Secondary | ICD-10-CM

## 2019-12-10 NOTE — Telephone Encounter (Signed)
Pt given enough RF to last until upcoming appt Requested Prescriptions  Pending Prescriptions Disp Refills  . labetalol (NORMODYNE) 300 MG tablet [Pharmacy Med Name: LABETALOL 300MG  TABLETS] 52 tablet 0    Sig: TAKE 1 TABLET BY MOUTH TWICE DAILY     Cardiovascular:  Beta Blockers Failed - 12/10/2019  7:07 AM      Failed - Valid encounter within last 6 months    Recent Outpatient Visits          1 year ago Stage 4 chronic kidney disease ([place])   [facility] [name], MD   1 year ago Essential hypertension, benign   [facility] [name], MD   1 year ago Screening for colon cancer   [place] [name], MD   2 years ago [place], Deborah B, MD   2 years ago Stage 3 chronic kidney disease [facility])   [place] [name], MD      Future Appointments            In 1 month [name], MD [place] BP in normal range    BP Readings from Last 1 Encounters:  02/14/19 133/85         Passed - Last Heart Rate in normal range    Pulse Readings from Last 1 Encounters:  02/14/19 62         . amLODipine (NORVASC) 10 MG tablet [Pharmacy Med Name: AMLODIPINE BESYLATE 10MG  TABLETS] 52 tablet 0    Sig: TAKE 1 TABLET(10 MG) BY MOUTH DAILY     Cardiovascular:  Calcium Channel Blockers Failed - 12/10/2019  7:07 AM      Failed - Valid encounter within last 6 months    Recent Outpatient Visits          1 year ago Stage 4 chronic kidney disease ([place])   [place] [name], MD   1 year ago Essential hypertension, benign   [facility] [name], MD   1 year ago Screening for colon cancer   [place] [name], MD   2 years ago  [place], MD   2 years ago Stage 3 chronic kidney disease [facility])   [place] [name], MD      Future Appointments            In 1 month [name], MD [place] BP in normal range    BP Readings from Last 1 Encounters:  02/14/19 133/85         . pravastatin (PRAVACHOL) 40 MG tablet [Pharmacy Med Name: PRAVASTATIN 40MG  TABLETS] 52 tablet 0    Sig: TAKE 1 TABLET(40 MG) BY MOUTH DAILY     Cardiovascular:  Antilipid - Statins Failed - 12/10/2019  7:07 AM      Failed - Total Cholesterol in normal range and within 360 days    Cholesterol, Total  Date Value Ref Range Status  06/09/2018 138 100 - 199 mg/dL Final  Failed - LDL in normal range and within 360 days    LDL Calculated  Date Value Ref Range Status  06/09/2018 84 0 - 99 mg/dL Final   Direct LDL  Date Value Ref Range Status  10/23/2009 97 mg/dL Final    Comment:    See lab report for associated comment(s)         Failed - HDL in normal range and within 360 days    HDL  Date Value Ref Range Status  06/09/2018 40 >39 mg/dL Final         Failed - Triglycerides in normal range and within 360 days    Triglycerides  Date Value Ref Range Status  06/09/2018 71 0 - 149 mg/dL Final         Failed - Valid encounter within last 12 months    Recent Outpatient Visits          1 year ago Stage 4 chronic kidney disease ([place])   [facility] [name], MD   1 year ago Essential hypertension, benign    [name]   1 year ago Screening for colon cancer   [place] [name], MD   2 years ago [place], Deborah B, MD   2 years ago Stage 3 chronic kidney disease [facility])   [place], [name], MD      Future Appointments            In 1 month [name], MD [place] - Patient is not pregnant

## 2020-01-06 ENCOUNTER — Encounter (HOSPITAL_COMMUNITY): Payer: Self-pay

## 2020-01-06 ENCOUNTER — Emergency Department (HOSPITAL_COMMUNITY)
Admission: EM | Admit: 2020-01-06 | Discharge: 2020-01-06 | Disposition: A | Discharge status: Home / Self Care | Payer: Medicare HMO | Attending: Emergency Medicine | Admitting: Emergency Medicine

## 2020-01-06 ENCOUNTER — Other Ambulatory Visit: Payer: Self-pay

## 2020-01-06 DIAGNOSIS — N189 Chronic kidney disease, unspecified: Secondary | ICD-10-CM | POA: Insufficient documentation

## 2020-01-06 DIAGNOSIS — U071 COVID-19: Secondary | ICD-10-CM | POA: Diagnosis not present

## 2020-01-06 DIAGNOSIS — I129 Hypertensive chronic kidney disease with stage 1 through stage 4 chronic kidney disease, or unspecified chronic kidney disease: Secondary | ICD-10-CM | POA: Insufficient documentation

## 2020-01-06 DIAGNOSIS — Z79899 Other long term (current) drug therapy: Secondary | ICD-10-CM | POA: Diagnosis not present

## 2020-01-06 DIAGNOSIS — R42 Dizziness and giddiness: Secondary | ICD-10-CM | POA: Diagnosis not present

## 2020-01-06 DIAGNOSIS — E860 Dehydration: Secondary | ICD-10-CM | POA: Diagnosis not present

## 2020-01-06 LAB — CBC WITH DIFFERENTIAL/PLATELET
Abs Immature Granulocytes: 0.02 10*3/uL (ref 0.00–0.07)
Basophils Absolute: 0 10*3/uL (ref 0.0–0.1)
Basophils Relative: 0 %
Eosinophils Absolute: 0 10*3/uL (ref 0.0–0.5)
Eosinophils Relative: 0 %
HCT: 36.2 % — ABNORMAL LOW (ref 39.0–52.0)
Hemoglobin: 11.4 g/dL — ABNORMAL LOW (ref 13.0–17.0)
Immature Granulocytes: 1 %
Lymphocytes Relative: 17 %
Lymphs Abs: 0.7 10*3/uL (ref 0.7–4.0)
MCH: 25.9 pg — ABNORMAL LOW (ref 26.0–34.0)
MCHC: 31.5 g/dL (ref 30.0–36.0)
MCV: 82.1 fL (ref 80.0–100.0)
Monocytes Absolute: 0.3 10*3/uL (ref 0.1–1.0)
Monocytes Relative: 8 %
Neutro Abs: 2.9 10*3/uL (ref 1.7–7.7)
Neutrophils Relative %: 74 %
Platelets: 157 10*3/uL (ref 150–400)
RBC: 4.41 MIL/uL (ref 4.22–5.81)
RDW: 12.6 % (ref 11.5–15.5)
WBC: 3.9 10*3/uL — ABNORMAL LOW (ref 4.0–10.5)
nRBC: 0 % (ref 0.0–0.2)

## 2020-01-06 LAB — COMPREHENSIVE METABOLIC PANEL
ALT: 20 U/L (ref 0–44)
AST: 25 U/L (ref 15–41)
Albumin: 3.7 g/dL (ref 3.5–5.0)
Alkaline Phosphatase: 49 U/L (ref 38–126)
Anion gap: 12 (ref 5–15)
BUN: 40 mg/dL — ABNORMAL HIGH (ref 8–23)
CO2: 21 mmol/L — ABNORMAL LOW (ref 22–32)
Calcium: 9.1 mg/dL (ref 8.9–10.3)
Chloride: 101 mmol/L (ref 98–111)
Creatinine, Ser: 4.67 mg/dL — ABNORMAL HIGH (ref 0.61–1.24)
GFR calc Af Amer: 14 mL/min — ABNORMAL LOW (ref >60)
GFR calc non Af Amer: 12 mL/min — ABNORMAL LOW (ref >60)
Glucose, Bld: 123 mg/dL — ABNORMAL HIGH (ref 70–99)
Potassium: 3.5 mmol/L (ref 3.5–5.1)
Sodium: 134 mmol/L — ABNORMAL LOW (ref 135–145)
Total Bilirubin: 1 mg/dL (ref 0.3–1.2)
Total Protein: 6.8 g/dL (ref 6.5–8.1)

## 2020-01-06 LAB — LIPASE, BLOOD: Lipase: 34 U/L (ref 11–51)

## 2020-01-06 LAB — CBG MONITORING, ED: Glucose-Capillary: 117 mg/dL — ABNORMAL HIGH (ref 70–99)

## 2020-01-06 LAB — SARS CORONAVIRUS 2 BY RT PCR (HOSPITAL ORDER, PERFORMED IN ~~LOC~~ HOSPITAL LAB): SARS Coronavirus 2: POSITIVE — AB

## 2020-01-06 MED ORDER — ONDANSETRON 4 MG PO TBDP
8.0000 mg | ORAL_TABLET | Freq: Once | ORAL | Status: AC
  Administered 2020-01-06: 8 mg via ORAL
  Filled 2020-01-06: qty 2

## 2020-01-06 MED ORDER — ONDANSETRON 4 MG PO TBDP: 4.0000 mg | ORAL_TABLET | Freq: Three times a day (TID) | ORAL | 0 refills | Status: DC | PRN

## 2020-01-06 MED ORDER — ACETAMINOPHEN 325 MG PO TABS
650.0000 mg | ORAL_TABLET | Freq: Once | ORAL | Status: AC
  Administered 2020-01-06: 650 mg via ORAL
  Filled 2020-01-06: qty 2

## 2020-01-06 NOTE — ED Provider Notes (Signed)
[facility] EMERGENCY DEPARTMENT Provider Note   CSN: [id number] Arrival date & time: 01/06/20  1424     History Chief Complaint  Patient presents with  . Covid Exposure  . Near Syncope    Brent Jimenez is a 64 y.o. male.  HPI      Brent Jimenez is a 64 y.o. male, with a history of CKD, hypercholesterolemia, HTN, presenting to the ED with lightheadedness for the last 5 days.  Decreased taste for the last 3 days. Nausea. States he has not been eating or drinking much over the last few days because everything tastes different. Before arrival, he states he felt nauseous and lightheaded, therefore his wife brought him to the emergency department. He also notes his wife has had exposure to a Covid positive patient through her job as a [employer]ar[employer] manager. No Covid vaccination. Denies shortness of breath, abdominal pain, vomiting, diarrhea, headache, syncope, acute neurologic deficits, or any other complaints.  Past Medical History:  Diagnosis Date  . Brain bleed ([place]) 2007   expressive asphasia  . Chronic kidney disease   . Hypercholesteremia   . Hypertension   . Stroke [facility])     Patient Active Problem List   Diagnosis Date Noted  . Sensorineural hearing loss (SNHL) of both ears 11/05/2017  . Hypercholesteremia 11/07/2015  . Essential hypertension, benign 11/30/2013  . Other and unspecified hyperlipidemia 11/30/2013  . Chronic kidney disease 11/30/2013  . BPH (benign prostatic hyperplasia) 11/30/2013  . Prediabetes 11/30/2013  . Vitamin D deficiency 11/30/2013  . Erectile dysfunction 11/30/2013    Past Surgical History:  Procedure Laterality Date  . BRAIN SURGERY  2007       Family History  Problem Relation Age of Onset  . Heart disease Mother   . Stroke Father 7       died of stroke  . Hypertension Father   . Colon cancer Neg Hx   . Colon polyps Neg Hx   . Esophageal cancer Neg Hx   . Stomach cancer Neg Hx   . Rectal cancer Neg Hx     Social  History   Tobacco Use  . Smoking status: Never Smoker  . Smokeless tobacco: Never Used  Substance Use Topics  . Alcohol use: No  . Drug use: No    Home Medications Prior to Admission medications   Medication Sig Start Date End Date Taking? Authorizing Provider  amLODipine (NORVASC) 10 MG tablet TAKE 1 TABLET(10 MG) BY MOUTH DAILY 12/10/19   [name], MD  calcitRIOL (ROCALTROL) 0.5 MCG capsule Take 1 capsule (0.5 mcg total) by mouth daily. Patient not taking: Reported on 02/14/2019 12/07/18   [name], MD  cholecalciferol (VITAMIN D3) 25 MCG (1000 UT) tablet Take 5,000 Units by mouth daily.    [provider]  doxazosin (CARDURA) 8 MG tablet TAKE 1 TABLET(8 MG) BY MOUTH DAILY 05/20/19   [name], MD  labetalol (NORMODYNE) 300 MG tablet TAKE 1 TABLET BY MOUTH TWICE DAILY 12/10/19   [name], MD  loratadine (CLARITIN) 10 MG tablet Take 1 tablet (10 mg total) by mouth daily. 11/09/17   Ladell Pier, MD  ondansetron (ZOFRAN ODT) 4 MG disintegrating tablet Take 1 tablet (4 mg total) by mouth every 8 (eight) hours as needed for nausea or vomiting. 01/06/20   Dontarious Schaum C, PA-C  pravastatin (PRAVACHOL) 40 MG tablet TAKE 1 TABLET(40 MG) BY MOUTH DAILY 12/10/19   [name], MD  sildenafil (VIAGRA) 50 MG tablet Take 1  tablet (50 mg total) by mouth daily as needed for erectile dysfunction. At least 24 hours between doses 09/30/19   [name], MD    Allergies    Patient has no known allergies.  Review of Systems   Review of Systems  Constitutional: Positive for fever.  Respiratory: Negative for cough and shortness of breath.   Cardiovascular: Negative for chest pain and leg swelling.  Gastrointestinal: Positive for nausea. Negative for abdominal pain, diarrhea and vomiting.  Genitourinary: Negative for dysuria and flank pain.  Neurological: Positive for light-headedness. Negative for syncope and headaches.  All other systems reviewed and are  negative.   Physical Exam Updated Vital Signs BP 103/78   Pulse 79   Temp (!) 102.9 F (39.4 C) (Oral)   Resp (!) 24   Ht 6\' 4"  (1.93 m)   Wt 104.3 kg   SpO2 94%   BMI 28.00 kg/m   Physical Exam Vitals and nursing note reviewed.  Constitutional:      General: He is not in acute distress.    Appearance: He is well-developed. He is not diaphoretic.  HENT:     Head: Normocephalic and atraumatic.     Mouth/Throat:     Mouth: Mucous membranes are moist.     Pharynx: Oropharynx is clear.  Eyes:     Conjunctiva/sclera: Conjunctivae normal.  Cardiovascular:     Rate and Rhythm: Normal rate and regular rhythm.     Pulses: Normal pulses.          Radial pulses are 2+ on the right side and 2+ on the left side.       Posterior tibial pulses are 2+ on the right side and 2+ on the left side.     Heart sounds: Normal heart sounds.     Comments: Tactile temperature in the extremities appropriate and equal bilaterally. Pulmonary:     Effort: Pulmonary effort is normal. No respiratory distress.     Breath sounds: Normal breath sounds.  Abdominal:     Palpations: Abdomen is soft.     Tenderness: There is no abdominal tenderness. There is no guarding.  Musculoskeletal:     Cervical back: Neck supple.     Right lower leg: No edema.     Left lower leg: No edema.  Lymphadenopathy:     Cervical: No cervical adenopathy.  Skin:    General: Skin is warm and dry.  Neurological:     Mental Status: He is alert and oriented to person, place, and time.     Comments: No noted acute cognitive deficit. Sensation grossly intact to light touch in the extremities.   Grip strengths equal bilaterally.   Strength 5/5 in all extremities.  No gait disturbance.  Coordination intact.  Cranial nerves III-XII grossly intact.  Handles oral secretions without noted difficulty.  No noted phonation or speech deficit. No facial droop.   Psychiatric:        Mood and Affect: Mood and affect normal.         Speech: Speech normal.        Behavior: Behavior normal.     ED Results / Procedures / Treatments   Labs (all labs ordered are listed, but only abnormal results are displayed) Labs Reviewed  SARS CORONAVIRUS 2 BY RT PCR (HOSPITAL ORDER, [facility]) - Abnormal; Notable for the following components:      Result Value   SARS Coronavirus 2 POSITIVE (*)    All other components  within normal limits  COMPREHENSIVE METABOLIC PANEL - Abnormal; Notable for the following components:   Sodium 134 (*)    CO2 21 (*)    Glucose, Bld 123 (*)    BUN 40 (*)    Creatinine, Ser 4.67 (*)    GFR calc non Af Amer 12 (*)    GFR calc Af Amer 14 (*)    All other components within normal limits  CBC WITH DIFFERENTIAL/PLATELET - Abnormal; Notable for the following components:   WBC 3.9 (*)    Hemoglobin 11.4 (*)    HCT 36.2 (*)    MCH 25.9 (*)    All other components within normal limits  CBG MONITORING, ED - Abnormal; Notable for the following components:   Glucose-Capillary 117 (*)    All other components within normal limits  LIPASE, BLOOD    EKG EKG Interpretation  Date/Time:  Friday January 06 2020 17:33:50 EDT Ventricular Rate:  70 PR Interval:    QRS Duration: 99 QT Interval:  427 QTC Calculation: 461 R Axis:   29 Text Interpretation: Sinus rhythm Prolonged PR interval Abnormal T, consider ischemia, lateral leads No old tracing to compare Confirmed by Calvert Cantor [phone]) on 01/06/2020 5:38:15 PM   Radiology No results found.  Procedures Procedures (including critical care time)  Medications Ordered in ED Medications  acetaminophen (TYLENOL) tablet 650 mg (650 mg Oral Given 01/06/20 1536)  ondansetron (ZOFRAN-ODT) disintegrating tablet 8 mg (8 mg Oral Given 01/06/20 2157)    ED Course  I have reviewed the triage vital signs and the nursing notes.  Pertinent labs & imaging results that were available during my care of the patient were reviewed by me  and considered in my medical decision making (see chart for details).  Clinical Course as of Jan 05 2325  Fri Jan 06, 2020  2141 Spoke with Janene Madeira, NP with the [facility] infusion team.  She states she will work on making contact with patient.   [SJ]    Clinical Course User Index [SJ] Johan Antonacci, Helane Gunther, PA-C   MDM Rules/Calculators/A&P                          Patient presents after an episode of lightheadedness and nausea. Nontoxic-appearing, not tachycardic, not hypotensive, not tachypneic, maintains excellent SPO2 on room air.  I personally reviewed and interpreted the patient's lab results. Covid positive. He does have increase in his creatinine.  Given the patient's recent poor oral intake I suspect this may be due to a degree of dehydration. Despite his increase in his creatinine, patient states he has had no difficulty urinating.  He apparently urinated before I evaluated him not knowing that we may need a urine sample. Tolerated PO prior to discharge. The patient was given instructions for home care as well as return precautions. Patient voices understanding of these instructions, accepts the plan, and is comfortable with discharge.   Findings and plan of care discussed with Calvert Cantor, MD.   Vitals:   01/06/20 1930 01/06/20 2000 01/06/20 2045 01/06/20 2145  BP: 135/81 (!) 132/91 (!) 144/91 (!) 143/98  Pulse: 73 73 74 77  Resp: 16 18 20 20   Temp:      TempSrc:      SpO2: 98% 99% 98% 97%  Weight:      Height:         Final Clinical Impression(s) / ED Diagnoses Final diagnoses:  COVID-19  Dehydration  Rx / DC Orders ED Discharge Orders         Ordered    ondansetron (ZOFRAN ODT) 4 MG disintegrating tablet  Every 8 hours PRN        01/06/20 2140           Layla Maw 01/06/20 2329    Truddie Hidden, MD 01/06/20 [phone]

## 2020-01-06 NOTE — Discharge Instructions (Addendum)
COVID-19 isolation recommendations  Patients who have symptoms consistent with COVID-19 should self isolate until: At least 3 days (72 hours) have passed since recovery, defined as resolution of fever without the use of fever reducing medications and improvement in respiratory symptoms (e.g., cough, shortness of breath), and At least 7 days have passed since symptoms first appeared. Retesting is not required and not recommended as patients can continue to test positive for several weeks despite lack of symptoms.  General Viral Syndrome Care Instructions:  Your test for COVID-19 was positive.  This illness is caused by a virus.  Viruses do not require or respond to antibiotics. Treatment is symptomatic care and it is important to note that these symptoms may last for 7-14 days.   Hand washing: Wash your hands throughout the day, but especially before and after touching the face, using the restroom, sneezing, coughing, or touching surfaces that have been coughed or sneezed upon. Hydration: Symptoms of most illnesses will be intensified and complicated by dehydration. Dehydration can also extend the duration of symptoms. Drink plenty of fluids and get plenty of rest. You should be drinking at least half a liter of water an hour to stay hydrated. Electrolyte drinks (ex. Gatorade, Powerade, Pedialyte) are also encouraged. You should be drinking enough fluids to make your urine light yellow, almost clear. If this is not the case, you are not drinking enough water. Please note that some of the treatments indicated below will not be effective if you are not adequately hydrated. Diet: Please concentrate on hydration, however, you may introduce food slowly.  Start with a clear liquid diet, progressed to a full liquid diet, and then bland solids as you are able. Pain or fever: Acetaminophen (generic for Tylenol) for pain or fever.  Acetaminophen: May take acetaminophen (generic for Tylenol), as needed, for  pain. Your daily total maximum amount of acetaminophen from all sources should be limited to 4000mg /day for persons without liver problems, or 2000mg /day for those with liver problems. Nausea/vomiting: Use the ondansetron (generic for Zofran) for nausea or vomiting.  This medication may not prevent all vomiting or nausea, but can help facilitate better hydration. Things that can help with nausea/vomiting also include peppermint/menthol candies, vitamin B12, and ginger. Cough: Teas, warm liquids, broths, and honey can help with cough. Zyrtec or Claritin: May add these medication daily to control underlying symptoms of congestion, sneezing, and other signs of allergies.  These medications are available over-the-counter. Generics: Cetirizine (generic for Zyrtec) and loratadine (generic for Claritin). Fluticasone: Use fluticasone (generic for Flonase), as directed, for nasal and sinus congestion.  This medication is available over-the-counter. Congestion: Plain guaifenesin (generic for plain Mucinex) may help relieve congestion. Saline sinus rinses and saline nasal sprays may also help relieve congestion.  Sore throat: Warm liquids or Chloraseptic spray may help soothe a sore throat. Gargle twice a day with a salt water solution made from a half teaspoon of salt in a cup of warm water.  Follow up: Follow up with a primary care provider within the next two weeks should symptoms fail to resolve. Return: Return to the ED for significantly worsening symptoms, shortness of breath, persistent vomiting, large amounts of blood in stool, or any other major concerns.  For prescription assistance, may try using prescription discount sites or apps, such as goodrx.com  There was an increase in your creatinine, which is a measure of your kidney function.  It is very important that you stay well-hydrated and have this value retested by either  your primary care provider or kidney specialist.

## 2020-01-06 NOTE — ED Notes (Signed)
Patient verbalizes understanding of discharge instructions. Opportunity for questioning and answers were provided. Armband removed by staff, pt discharged from ED ambulatory by self\  

## 2020-01-06 NOTE — ED Triage Notes (Signed)
Pt reports covid symptoms x5 days, emesis, lost of taste and smell, body aches and fever. Pt has a near syncopal episode while waiting to be covid tested at [facility]

## 2020-01-07 ENCOUNTER — Other Ambulatory Visit: Payer: Self-pay | Admitting: Nurse Practitioner

## 2020-01-07 DIAGNOSIS — U071 COVID-19: Secondary | ICD-10-CM

## 2020-01-07 DIAGNOSIS — I10 Essential (primary) hypertension: Secondary | ICD-10-CM

## 2020-01-07 DIAGNOSIS — N184 Chronic kidney disease, stage 4 (severe): Secondary | ICD-10-CM

## 2020-01-07 NOTE — Progress Notes (Signed)
I connected by phone with Brent Jimenez on 01/07/2020 at 10:11 AM to discuss the potential use of a new treatment for mild to moderate COVID-19 viral infection in non-hospitalized patients.  This patient is a 64 y.o. male that meets the FDA criteria for Emergency Use Authorization of COVID monoclonal antibody casirivimab/imdevimab.  Has a (+) direct SARS-CoV-2 viral test result  Has mild or moderate COVID-19   Is NOT hospitalized due to COVID-19  Is within 10 days of symptom onset (01/05/20)  Has at least one of the high risk factor(s) for progression to severe COVID-19 and/or hospitalization as defined in EUA.  Specific high risk criteria : Cardiovascular disease or hypertension   I have spoken and communicated the following to the patient or parent/caregiver regarding COVID monoclonal antibody treatment:  1. FDA has authorized the emergency use for the treatment of mild to moderate COVID-19 in adults and pediatric patients with positive results of direct SARS-CoV-2 viral testing who are 1 years of age and older weighing at least 40 kg, and who are at high risk for progressing to severe COVID-19 and/or hospitalization.  2. The significant known and potential risks and benefits of COVID monoclonal antibody, and the extent to which such potential risks and benefits are unknown.  3. Information on available alternative treatments and the risks and benefits of those alternatives, including clinical trials.  4. Patients treated with COVID monoclonal antibody should continue to self-isolate and use infection control measures (e.g., wear mask, isolate, social distance, avoid sharing personal items, clean and disinfect "high touch" surfaces, and frequent handwashing) according to CDC guidelines.   5. The patient or parent/caregiver has the option to accept or refuse COVID monoclonal antibody treatment.  After reviewing this information with the patient, The patient agreed to proceed with  receiving casirivimab\imdevimab infusion and will be provided a copy of the Fact sheet prior to receiving the infusion. [name] 01/07/2020 10:11 AM

## 2020-01-08 ENCOUNTER — Ambulatory Visit (HOSPITAL_COMMUNITY)
Admission: RE | Admit: 2020-01-08 | Discharge: 2020-01-08 | Disposition: A | Discharge status: Home / Self Care | Payer: Medicare Other | Source: Ambulatory Visit | Attending: Pulmonary Disease | Admitting: Pulmonary Disease

## 2020-01-08 DIAGNOSIS — U071 COVID-19: Secondary | ICD-10-CM | POA: Insufficient documentation

## 2020-01-08 DIAGNOSIS — I10 Essential (primary) hypertension: Secondary | ICD-10-CM

## 2020-01-08 DIAGNOSIS — Z23 Encounter for immunization: Secondary | ICD-10-CM | POA: Insufficient documentation

## 2020-01-08 DIAGNOSIS — N184 Chronic kidney disease, stage 4 (severe): Secondary | ICD-10-CM

## 2020-01-08 MED ORDER — EPINEPHRINE 0.3 MG/0.3ML IJ SOAJ: 0.3000 mg | Freq: Once | INTRAMUSCULAR | Status: DC | PRN

## 2020-01-08 MED ORDER — METHYLPREDNISOLONE SODIUM SUCC 125 MG IJ SOLR: 125.0000 mg | Freq: Once | INTRAMUSCULAR | Status: DC | PRN

## 2020-01-08 MED ORDER — ALBUTEROL SULFATE HFA 108 (90 BASE) MCG/ACT IN AERS: 2.0000 | INHALATION_SPRAY | Freq: Once | RESPIRATORY_TRACT | Status: DC | PRN

## 2020-01-08 MED ORDER — SODIUM CHLORIDE 0.9 % IV SOLN: INTRAVENOUS | Status: DC | PRN

## 2020-01-08 MED ORDER — SODIUM CHLORIDE 0.9 % IV SOLN
1200.0000 mg | Freq: Once | INTRAVENOUS | Status: AC
  Administered 2020-01-08: 1200 mg via INTRAVENOUS
  Filled 2020-01-08: qty 10

## 2020-01-08 MED ORDER — FAMOTIDINE IN NACL 20-0.9 MG/50ML-% IV SOLN: 20.0000 mg | Freq: Once | INTRAVENOUS | Status: DC | PRN

## 2020-01-08 MED ORDER — DIPHENHYDRAMINE HCL 50 MG/ML IJ SOLN: 50.0000 mg | Freq: Once | INTRAMUSCULAR | Status: DC | PRN

## 2020-01-08 NOTE — Discharge Instructions (Signed)

## 2020-01-08 NOTE — Progress Notes (Signed)
  Diagnosis: COVID-19  Physician: Joya Gaskins MD  Procedure: Covid Infusion Clinic Med: casirivimab\imdevimab infusion - Provided patient with casirivimab\imdevimab fact sheet for patients, parents and caregivers prior to infusion.  Complications: No immediate complications noted.  Discharge: Discharged home   Lucinda Dell 01/08/2020

## 2020-01-19 ENCOUNTER — Other Ambulatory Visit: Payer: Self-pay | Admitting: Family Medicine

## 2020-01-19 DIAGNOSIS — I10 Essential (primary) hypertension: Secondary | ICD-10-CM

## 2020-01-31 ENCOUNTER — Ambulatory Visit: Payer: Medicare HMO | Attending: Family Medicine | Admitting: Family Medicine

## 2020-01-31 ENCOUNTER — Other Ambulatory Visit: Payer: Self-pay

## 2020-01-31 DIAGNOSIS — I10 Essential (primary) hypertension: Secondary | ICD-10-CM

## 2020-01-31 DIAGNOSIS — Z8616 Personal history of COVID-19: Secondary | ICD-10-CM | POA: Diagnosis not present

## 2020-01-31 DIAGNOSIS — N184 Chronic kidney disease, stage 4 (severe): Secondary | ICD-10-CM | POA: Diagnosis not present

## 2020-01-31 DIAGNOSIS — R399 Unspecified symptoms and signs involving the genitourinary system: Secondary | ICD-10-CM

## 2020-01-31 DIAGNOSIS — E7800 Pure hypercholesterolemia, unspecified: Secondary | ICD-10-CM | POA: Diagnosis not present

## 2020-01-31 MED ORDER — LABETALOL HCL 300 MG PO TABS: 300.0000 mg | ORAL_TABLET | Freq: Two times a day (BID) | ORAL | 1 refills | Status: DC

## 2020-01-31 MED ORDER — AMLODIPINE BESYLATE 10 MG PO TABS: ORAL_TABLET | ORAL | 1 refills | Status: DC

## 2020-01-31 MED ORDER — PRAVASTATIN SODIUM 40 MG PO TABS: ORAL_TABLET | ORAL | 1 refills | Status: DC

## 2020-01-31 MED ORDER — DOXAZOSIN MESYLATE 8 MG PO TABS: ORAL_TABLET | ORAL | 1 refills | Status: DC

## 2020-01-31 MED ORDER — FINASTERIDE 5 MG PO TABS: 5.0000 mg | ORAL_TABLET | Freq: Every day | ORAL | 1 refills | Status: DC

## 2020-01-31 NOTE — Progress Notes (Signed)
Virtual Visit via Telephone Note  I connected with Brent Jimenez, on 01/31/2020 at 8:33 AM by telephone due to the COVID-19 pandemic and verified that I am speaking with the correct person using two identifiers.   Consent: I discussed the limitations, risks, security and privacy concerns of performing an evaluation and management service by telephone and the availability of in person appointments. I also discussed with the patient that there may be a patient responsible charge related to this service. The patient expressed understanding and agreed to proceed.   Location of Patient: Home  Location of Provider: Clinic   Persons participating in Telemedicine visit: Sherry Rogus Farrington-CMA Dr. Margarita Rana     History of Present Illness: Brent Jimenez  is a 64 year old male with a history of stroke with residual expressive dysarthria, hypertension, stage IV chronic kidney disease (followed by nephrology), prediabetes, hypercholesterolemia, COVID-19 infection status post monoclonal antibody infusion seen for follow-up visit.  Spouse provides the history.  He has some weakness, fatigue, persisting abnormal taste, abnormal smell states his infection but otherwise is doing well.  He has had urinary frequency and wife states he drinks a lot of water. He endorses straining as well, but he has no accidents, no hematuria, dysuria.  Currently on doxazosin. Wife states he looks like he has lost weight but she has no scale to measure this objectively.  He took a fall on 01/08/20 after he felt dizzy and then he found himself on the ground but that was shortly after his COVID-19 diagnosis and antibody infusion but he states he did not hit his head.  Since then he has not experienced similar symptoms. Past Medical History:  Diagnosis Date   Brain bleed [facility]) 2007   expressive asphasia   Chronic kidney disease    Hypercholesteremia    Hypertension    Stroke ([place])    No Known  Allergies  Current Outpatient Medications on File Prior to Visit  Medication Sig Dispense Refill   amLODipine (NORVASC) 10 MG tablet TAKE 1 TABLET(10 MG) BY MOUTH DAILY 52 tablet 0   cholecalciferol (VITAMIN D3) 25 MCG (1000 UT) tablet Take 5,000 Units by mouth daily.     doxazosin (CARDURA) 8 MG tablet TAKE 1 TABLET(8 MG) BY MOUTH DAILY 90 tablet 1   labetalol (NORMODYNE) 300 MG tablet TAKE 1 TABLET BY MOUTH TWICE DAILY 52 tablet 0   loratadine (CLARITIN) 10 MG tablet Take 1 tablet (10 mg total) by mouth daily. 15 tablet 0   ondansetron (ZOFRAN ODT) 4 MG disintegrating tablet Take 1 tablet (4 mg total) by mouth every 8 (eight) hours as needed for nausea or vomiting. 20 tablet 0   pravastatin (PRAVACHOL) 40 MG tablet TAKE 1 TABLET(40 MG) BY MOUTH DAILY 52 tablet 0   sildenafil (VIAGRA) 50 MG tablet Take 1 tablet (50 mg total) by mouth daily as needed for erectile dysfunction. At least 24 hours between doses 10 tablet 1   calcitRIOL (ROCALTROL) 0.5 MCG capsule Take 1 capsule (0.5 mcg total) by mouth daily. (Patient not taking: Reported on 02/14/2019) 90 capsule 1   Current Facility-Administered Medications on File Prior to Visit  Medication Dose Route Frequency Provider Last Rate Last Admin   0.9 %  sodium chloride infusion  500 mL Intravenous Once Armbruster, Carlota Raspberry, MD        Observations/Objective: Awake, alert, oriented x3 Not in acute distress  CMP Latest Ref Rng & Units 01/06/2020 06/09/2018 05/13/2017  Glucose 70 - 99 mg/dL 123(H) 89 78  BUN 8 - 23 mg/dL 40(H) 31(H) 28(H)  Creatinine 0.61 - 1.24 mg/dL 4.67(H) 3.54(H) 3.49(H)  Sodium 135 - 145 mmol/L 134(L) 139 144  Potassium 3.5 - 5.1 mmol/L 3.5 3.9 4.0  Chloride 98 - 111 mmol/L 101 101 106  CO2 22 - 32 mmol/L 21(L) 23 20  Calcium 8.9 - 10.3 mg/dL 9.1 9.9 9.1  Total Protein 6.5 - 8.1 g/dL 6.8 6.5 6.8  Total Bilirubin 0.3 - 1.2 mg/dL 1.0 0.5 0.4  Alkaline Phos 38 - 126 U/L 49 61 67  AST 15 - 41 U/L 25 17 18   ALT 0 - 44  U/L 20 14 20     Lipid Panel     Component Value Date/Time   CHOL 138 06/09/2018 1132   TRIG 71 06/09/2018 1132   HDL 40 06/09/2018 1132   CHOLHDL 3.5 06/09/2018 1132   CHOLHDL 3.7 11/07/2015 1119   VLDL 20 11/07/2015 1119   LDLCALC 84 06/09/2018 1132   LDLDIRECT 97 10/23/2009 2324   LABVLDL 14 06/09/2018 1132      Assessment and Plan: 1. Essential hypertension, benign Stable Counseled on blood pressure goal of less than 130/80, low-sodium, DASH diet, medication compliance, 150 minutes of moderate intensity exercise per week. Discussed medication compliance, adverse effects. - amLODipine (NORVASC) 10 MG tablet; TAKE 1 TABLET(10 MG) BY MOUTH DAILY  Dispense: 90 tablet; Refill: 1 - labetalol (NORMODYNE) 300 MG tablet; Take 1 tablet (300 mg total) by mouth 2 (two) times daily.  Dispense: 90 tablet; Refill: 1  2. Hypercholesteremia Controlled Low-cholesterol diet - pravastatin (PRAVACHOL) 40 MG tablet; TAKE 1 TABLET(40 MG) BY MOUTH DAILY  Dispense: 90 tablet; Refill: 1  3. Stage 4 chronic kidney disease (HCC) Stable Avoid nephrotoxins Follow-up with nephrology   4. Lower urinary tract symptoms Uncontrolled Could be secondary to increase fluid intake We will add Proscar to his regimen - finasteride (PROSCAR) 5 MG tablet; Take 1 tablet (5 mg total) by mouth daily.  Dispense: 90 tablet; Refill: 1 - doxazosin (CARDURA) 8 MG tablet; TAKE 1 TABLET(8 MG) BY MOUTH DAILY  Dispense: 90 tablet; Refill: 1  5. History of COVID-19 Improving Discussed the long-haul symptoms of Covid and advised to perform activity as tolerated   Follow Up Instructions:    I discussed the assessment and treatment plan with the patient. The patient was provided an opportunity to ask questions and all were answered. The patient agreed with the plan and demonstrated an understanding of the instructions.   The patient was advised to call back or seek an in-person evaluation if the symptoms worsen or if  the condition fails to improve as anticipated.     I provided 17 minutes total of non-face-to-face time during this encounter including median intraservice time, reviewing previous notes, investigations, ordering medications, medical decision making, coordinating care and patient verbalized understanding at the end of the visit.     [name], MD, FAAFP. [facility] and [place], [place]   01/31/2020, 8:33 AM

## 2020-01-31 NOTE — Progress Notes (Signed)
Would like to do 3 month supply of medications.  Had a fall on 01/08/20.  Would like to discuss how long to wait to get the vaccine.  Frequent urination and weight loss.

## 2020-02-01 ENCOUNTER — Encounter: Payer: Self-pay | Admitting: Family Medicine

## 2020-02-01 DIAGNOSIS — N185 Chronic kidney disease, stage 5: Secondary | ICD-10-CM | POA: Diagnosis not present

## 2020-02-01 DIAGNOSIS — E211 Secondary hyperparathyroidism, not elsewhere classified: Secondary | ICD-10-CM | POA: Diagnosis not present

## 2020-02-01 DIAGNOSIS — D631 Anemia in chronic kidney disease: Secondary | ICD-10-CM | POA: Diagnosis not present

## 2020-02-01 DIAGNOSIS — R809 Proteinuria, unspecified: Secondary | ICD-10-CM | POA: Diagnosis not present

## 2020-02-01 DIAGNOSIS — I120 Hypertensive chronic kidney disease with stage 5 chronic kidney disease or end stage renal disease: Secondary | ICD-10-CM | POA: Diagnosis not present

## 2020-02-01 DIAGNOSIS — N189 Chronic kidney disease, unspecified: Secondary | ICD-10-CM | POA: Diagnosis not present

## 2020-02-08 DIAGNOSIS — N184 Chronic kidney disease, stage 4 (severe): Secondary | ICD-10-CM | POA: Diagnosis not present

## 2020-02-08 DIAGNOSIS — E211 Secondary hyperparathyroidism, not elsewhere classified: Secondary | ICD-10-CM | POA: Diagnosis not present

## 2020-02-08 DIAGNOSIS — D631 Anemia in chronic kidney disease: Secondary | ICD-10-CM | POA: Diagnosis not present

## 2020-02-08 DIAGNOSIS — R809 Proteinuria, unspecified: Secondary | ICD-10-CM | POA: Diagnosis not present

## 2020-02-08 DIAGNOSIS — N189 Chronic kidney disease, unspecified: Secondary | ICD-10-CM | POA: Diagnosis not present

## 2020-04-02 ENCOUNTER — Ambulatory Visit: Payer: Self-pay

## 2020-04-02 ENCOUNTER — Ambulatory Visit: Payer: Medicare HMO

## 2020-04-06 DIAGNOSIS — E211 Secondary hyperparathyroidism, not elsewhere classified: Secondary | ICD-10-CM | POA: Diagnosis not present

## 2020-04-06 DIAGNOSIS — D519 Vitamin B12 deficiency anemia, unspecified: Secondary | ICD-10-CM | POA: Diagnosis not present

## 2020-04-06 DIAGNOSIS — N184 Chronic kidney disease, stage 4 (severe): Secondary | ICD-10-CM | POA: Diagnosis not present

## 2020-04-06 DIAGNOSIS — N189 Chronic kidney disease, unspecified: Secondary | ICD-10-CM | POA: Diagnosis not present

## 2020-04-06 DIAGNOSIS — D631 Anemia in chronic kidney disease: Secondary | ICD-10-CM | POA: Diagnosis not present

## 2020-04-06 DIAGNOSIS — R809 Proteinuria, unspecified: Secondary | ICD-10-CM | POA: Diagnosis not present

## 2020-04-12 DIAGNOSIS — I129 Hypertensive chronic kidney disease with stage 1 through stage 4 chronic kidney disease, or unspecified chronic kidney disease: Secondary | ICD-10-CM | POA: Diagnosis not present

## 2020-04-12 DIAGNOSIS — D631 Anemia in chronic kidney disease: Secondary | ICD-10-CM | POA: Diagnosis not present

## 2020-04-12 DIAGNOSIS — R809 Proteinuria, unspecified: Secondary | ICD-10-CM | POA: Diagnosis not present

## 2020-04-12 DIAGNOSIS — N184 Chronic kidney disease, stage 4 (severe): Secondary | ICD-10-CM | POA: Diagnosis not present

## 2020-04-12 DIAGNOSIS — E211 Secondary hyperparathyroidism, not elsewhere classified: Secondary | ICD-10-CM | POA: Diagnosis not present

## 2020-04-12 DIAGNOSIS — N189 Chronic kidney disease, unspecified: Secondary | ICD-10-CM | POA: Diagnosis not present

## 2020-04-27 ENCOUNTER — Telehealth: Payer: Self-pay | Admitting: Family Medicine

## 2020-04-27 DIAGNOSIS — N184 Chronic kidney disease, stage 4 (severe): Secondary | ICD-10-CM

## 2020-04-27 NOTE — Telephone Encounter (Signed)
Patient's wife came into clinic requesting an update about the kidney doctor referral. I saw no documentation in the chart about an updated referral. Per nurse schedule appointment to discuss. Scheduled for 05/22/2020. However, when scheduling patient's wife clarified that they already have an existing Kidney doctor but they would like to switch to Dr. Domingo Cocking at [facility]. Per patients wife they have everything set up, just need the referral. Please advise.

## 2020-04-27 NOTE — Telephone Encounter (Signed)
Referral needs to be sent to Dr.Freeman with [facility].

## 2020-04-30 NOTE — Telephone Encounter (Signed)
Pt was called and a voicemail was left informing pt of referral being placed.

## 2020-04-30 NOTE — Telephone Encounter (Signed)
Referral has been placed. 

## 2020-05-16 DIAGNOSIS — R809 Proteinuria, unspecified: Secondary | ICD-10-CM | POA: Diagnosis not present

## 2020-05-16 DIAGNOSIS — D631 Anemia in chronic kidney disease: Secondary | ICD-10-CM | POA: Diagnosis not present

## 2020-05-16 DIAGNOSIS — E211 Secondary hyperparathyroidism, not elsewhere classified: Secondary | ICD-10-CM | POA: Diagnosis not present

## 2020-05-16 DIAGNOSIS — N189 Chronic kidney disease, unspecified: Secondary | ICD-10-CM | POA: Diagnosis not present

## 2020-05-16 DIAGNOSIS — I129 Hypertensive chronic kidney disease with stage 1 through stage 4 chronic kidney disease, or unspecified chronic kidney disease: Secondary | ICD-10-CM | POA: Diagnosis not present

## 2020-05-16 DIAGNOSIS — N184 Chronic kidney disease, stage 4 (severe): Secondary | ICD-10-CM | POA: Diagnosis not present

## 2020-05-17 ENCOUNTER — Telehealth: Payer: Self-pay | Admitting: Family Medicine

## 2020-05-17 NOTE — Telephone Encounter (Signed)
Brent Jimenez is calling from Avelino Leeds, MD office with [facility] calling to state that Dr. Jalene Mullet does not accept new patient. Can the patient see another an provider.? Patient saw Dr. Jalene Mullet years ago. But stated wanted to see a Nephrology DR in [place]. Wanting to know what Dr does the patient currently see?[phone] - Private Line

## 2020-05-18 NOTE — Telephone Encounter (Signed)
Sent referral to [facility]  Ph. # [phone] Address [address] [place] [address] They will contact the patient to schedule an appointment.  Sent Referral and Notes in [place] (RMS) PROGRAM

## 2020-05-22 ENCOUNTER — Other Ambulatory Visit: Payer: Self-pay

## 2020-05-22 ENCOUNTER — Encounter: Payer: Self-pay | Admitting: Family Medicine

## 2020-05-22 ENCOUNTER — Ambulatory Visit: Payer: Medicare HMO | Attending: Family Medicine | Admitting: Family Medicine

## 2020-05-22 DIAGNOSIS — I10 Essential (primary) hypertension: Secondary | ICD-10-CM

## 2020-05-22 DIAGNOSIS — E7800 Pure hypercholesterolemia, unspecified: Secondary | ICD-10-CM | POA: Diagnosis not present

## 2020-05-22 DIAGNOSIS — R399 Unspecified symptoms and signs involving the genitourinary system: Secondary | ICD-10-CM | POA: Diagnosis not present

## 2020-05-22 MED ORDER — AMLODIPINE BESYLATE 10 MG PO TABS: ORAL_TABLET | ORAL | 1 refills | Status: DC

## 2020-05-22 MED ORDER — LABETALOL HCL 300 MG PO TABS: 300.0000 mg | ORAL_TABLET | Freq: Two times a day (BID) | ORAL | 1 refills | Status: DC

## 2020-05-22 MED ORDER — DOXAZOSIN MESYLATE 8 MG PO TABS: ORAL_TABLET | ORAL | 1 refills | Status: DC

## 2020-05-22 MED ORDER — PRAVASTATIN SODIUM 40 MG PO TABS: ORAL_TABLET | ORAL | 1 refills | Status: DC

## 2020-05-22 MED ORDER — FINASTERIDE 5 MG PO TABS: 5.0000 mg | ORAL_TABLET | Freq: Every day | ORAL | 1 refills | Status: DC

## 2020-05-22 NOTE — Progress Notes (Signed)
Referral has been placed already. Pt is needing refills on medications.

## 2020-05-22 NOTE — Progress Notes (Signed)
Virtual Visit via Telephone Note  I connected with [name], on 05/22/2020 at 2:12 PM by telephone due to the COVID-19 pandemic and verified that I am speaking with the correct person using two identifiers.   Consent: I discussed the limitations, risks, security and privacy concerns of performing an evaluation and management service by telephone and the availability of in person appointments. I also discussed with the patient that there may be a patient responsible charge related to this service. The patient expressed understanding and agreed to proceed.   Location of Patient: Out and about  Location of Provider: Home   Persons participating in Telemedicine visit: Nelda Marseille Farrington-CMA Dr. Margarita Rana     History of Present Illness: Brent Jimenez is a 65 year old male with a history of stroke with residual expressive dysarthria, hypertension, stage IV chronic kidney disease (followed by nephrology), Prediabetes, hypercholesterolemia, COVID-19 infection status post monoclonal antibody infusion seen for follow-up visit.  Spouse provides the history. He has a cough which is intermittent and his spouse thinks he got it from his Grandkids. He has no fever or other upper respiratory symptoms.   He sees Nephrology tomorrow at [facility] and is awaiting his referral to [facility] to go through so he can transfer his care. He is otherwise doing well. Past Medical History:  Diagnosis Date  . Brain bleed ([place]) 2007   expressive asphasia  . Chronic kidney disease   . Hypercholesteremia   . Hypertension   . Stroke [facility])    No Known Allergies  Current Outpatient Medications on File Prior to Visit  Medication Sig Dispense Refill  . amLODipine (NORVASC) 10 MG tablet TAKE 1 TABLET(10 MG) BY MOUTH DAILY 90 tablet 1  . cholecalciferol (VITAMIN D3) 25 MCG (1000 UT) tablet Take 5,000 Units by mouth daily.    Marland Kitchen doxazosin (CARDURA) 8 MG tablet TAKE 1 TABLET(8 MG) BY  MOUTH DAILY 90 tablet 1  . finasteride (PROSCAR) 5 MG tablet Take 1 tablet (5 mg total) by mouth daily. 90 tablet 1  . labetalol (NORMODYNE) 300 MG tablet Take 1 tablet (300 mg total) by mouth 2 (two) times daily. 90 tablet 1  . loratadine (CLARITIN) 10 MG tablet Take 1 tablet (10 mg total) by mouth daily. 15 tablet 0  . ondansetron (ZOFRAN ODT) 4 MG disintegrating tablet Take 1 tablet (4 mg total) by mouth every 8 (eight) hours as needed for nausea or vomiting. 20 tablet 0  . pravastatin (PRAVACHOL) 40 MG tablet TAKE 1 TABLET(40 MG) BY MOUTH DAILY 90 tablet 1  . sildenafil (VIAGRA) 50 MG tablet Take 1 tablet (50 mg total) by mouth daily as needed for erectile dysfunction. At least 24 hours between doses 10 tablet 1  . calcitRIOL (ROCALTROL) 0.5 MCG capsule Take 1 capsule (0.5 mcg total) by mouth daily. (Patient not taking: No sig reported) 90 capsule 1   Current Facility-Administered Medications on File Prior to Visit  Medication Dose Route Frequency Provider Last Rate Last Admin  . 0.9 %  sodium chloride infusion  500 mL Intravenous Once Armbruster, Carlota Raspberry, MD        Observations/Objective: Awake, alert, oriented x3 Not in acute distress  Assessment and Plan: 1. Hypercholesteremia Controlled Low cholesterol diet - pravastatin (PRAVACHOL) 40 MG tablet; TAKE 1 TABLET(40 MG) BY MOUTH DAILY  Dispense: 90 tablet; Refill: 1  2. Essential hypertension, benign Controlled Counseled on blood pressure goal of less than 130/80, low-sodium, DASH diet, medication compliance, 150 minutes of moderate intensity exercise  per week. Discussed medication compliance, adverse effects. - labetalol (NORMODYNE) 300 MG tablet; Take 1 tablet (300 mg total) by mouth 2 (two) times daily.  Dispense: 180 tablet; Refill: 1 - amLODipine (NORVASC) 10 MG tablet; TAKE 1 TABLET(10 MG) BY MOUTH DAILY  Dispense: 90 tablet; Refill: 1  3. Lower urinary tract symptoms - doxazosin (CARDURA) 8 MG tablet; TAKE 1 TABLET(8 MG)  BY MOUTH DAILY  Dispense: 90 tablet; Refill: 1 - finasteride (PROSCAR) 5 MG tablet; Take 1 tablet (5 mg total) by mouth daily.  Dispense: 90 tablet; Refill: 1   Follow Up Instructions: 6 months   I discussed the assessment and treatment plan with the patient. The patient was provided an opportunity to ask questions and all were answered. The patient agreed with the plan and demonstrated an understanding of the instructions.   The patient was advised to call back or seek an in-person evaluation if the symptoms worsen or if the condition fails to improve as anticipated.     I provided 11 minutes total of non-face-to-face time during this encounter including median intraservice time, reviewing previous notes, investigations, ordering medications, medical decision making, coordinating care and patient verbalized understanding at the end of the visit.     [name], MD, FAAFP. [facility] and [place], [place]   05/22/2020, 2:12 PM

## 2020-05-23 DIAGNOSIS — N184 Chronic kidney disease, stage 4 (severe): Secondary | ICD-10-CM | POA: Diagnosis not present

## 2020-05-23 DIAGNOSIS — I129 Hypertensive chronic kidney disease with stage 1 through stage 4 chronic kidney disease, or unspecified chronic kidney disease: Secondary | ICD-10-CM | POA: Diagnosis not present

## 2020-05-23 DIAGNOSIS — N189 Chronic kidney disease, unspecified: Secondary | ICD-10-CM | POA: Diagnosis not present

## 2020-05-23 DIAGNOSIS — R809 Proteinuria, unspecified: Secondary | ICD-10-CM | POA: Diagnosis not present

## 2020-05-23 DIAGNOSIS — E211 Secondary hyperparathyroidism, not elsewhere classified: Secondary | ICD-10-CM | POA: Diagnosis not present

## 2020-05-23 DIAGNOSIS — D631 Anemia in chronic kidney disease: Secondary | ICD-10-CM | POA: Diagnosis not present

## 2020-05-31 ENCOUNTER — Telehealth: Payer: Self-pay | Admitting: Family Medicine

## 2020-05-31 NOTE — Telephone Encounter (Signed)
Copied from [place] [id number]. Topic: General - Other >> May 30, 2020 11:26 AM Alanda Slim E wrote: Reason for CRM: [facility]/ called to see who pts nephrologist is and why he wants to come to them/ they are not sure of the purpose of the referral tot hem/ please advise

## 2020-07-18 DIAGNOSIS — D631 Anemia in chronic kidney disease: Secondary | ICD-10-CM | POA: Diagnosis not present

## 2020-07-18 DIAGNOSIS — E211 Secondary hyperparathyroidism, not elsewhere classified: Secondary | ICD-10-CM | POA: Diagnosis not present

## 2020-07-18 DIAGNOSIS — N184 Chronic kidney disease, stage 4 (severe): Secondary | ICD-10-CM | POA: Diagnosis not present

## 2020-07-18 DIAGNOSIS — N189 Chronic kidney disease, unspecified: Secondary | ICD-10-CM | POA: Diagnosis not present

## 2020-07-18 DIAGNOSIS — R809 Proteinuria, unspecified: Secondary | ICD-10-CM | POA: Diagnosis not present

## 2020-07-18 DIAGNOSIS — I129 Hypertensive chronic kidney disease with stage 1 through stage 4 chronic kidney disease, or unspecified chronic kidney disease: Secondary | ICD-10-CM | POA: Diagnosis not present

## 2020-07-18 IMAGING — US RENAL/URINARY TRACT ULTRASOUND
1 series · 14 of 25 positions shown · non-contrast
Comparison: None.

CLINICAL DATA: Chronic kidney disease.

EXAM:
RENAL / URINARY TRACT ULTRASOUND COMPLETE

[Series 1: renal/urinary tract ultrasound · 0.24mm/px · 14 of 40 slices shown]
[im 1/40]
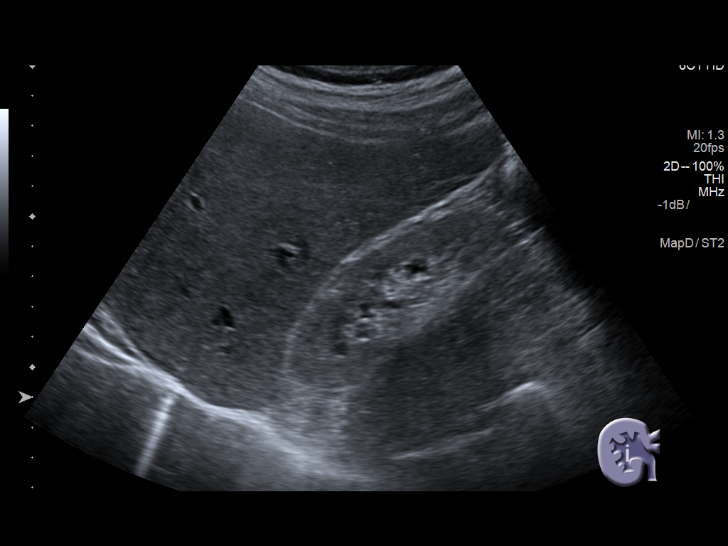
[im 4/40]
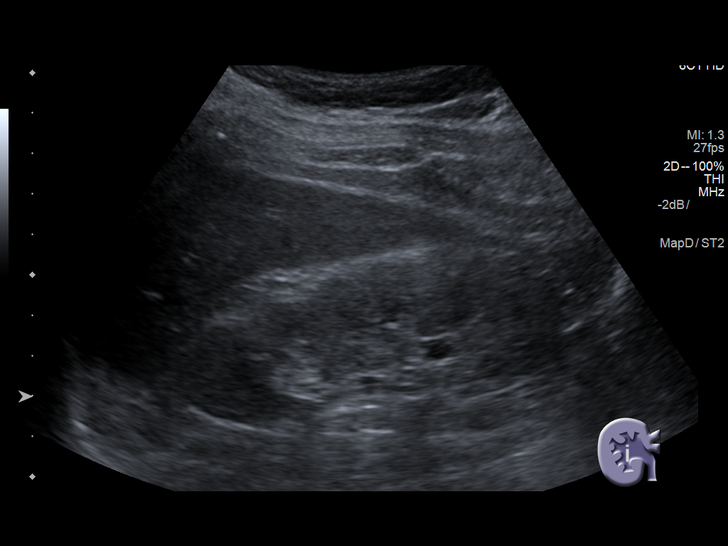
[im 7/40]
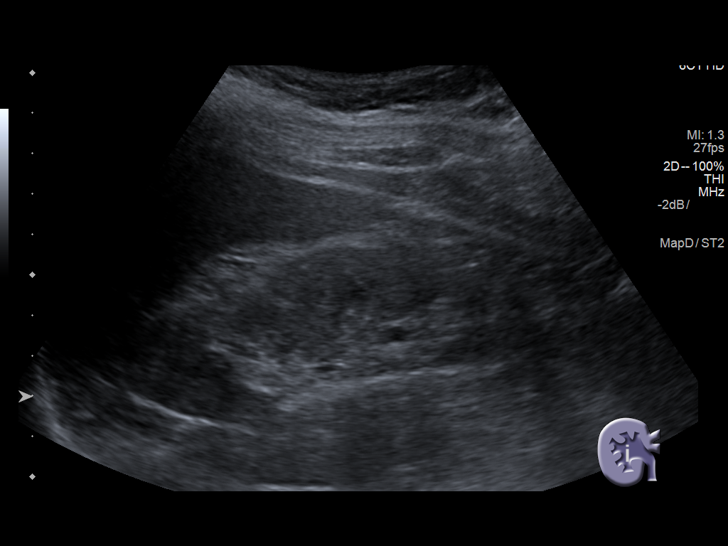
[im 10/40]
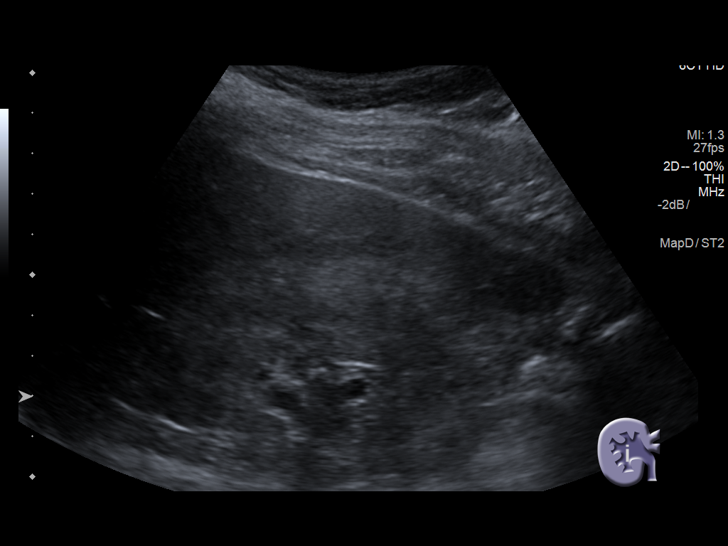
[im 14/40]
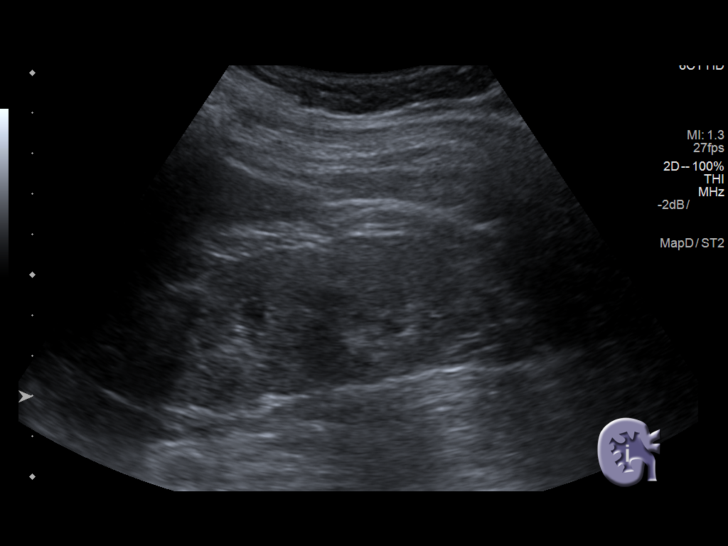
[im 15/40]
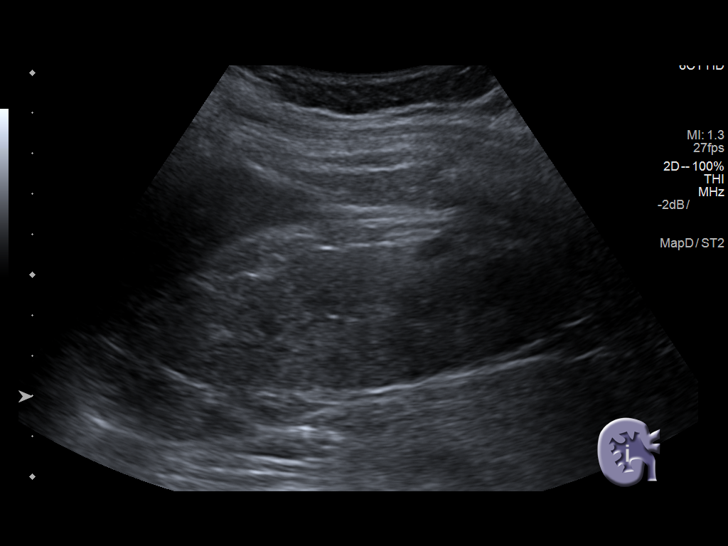
[im 18/40]
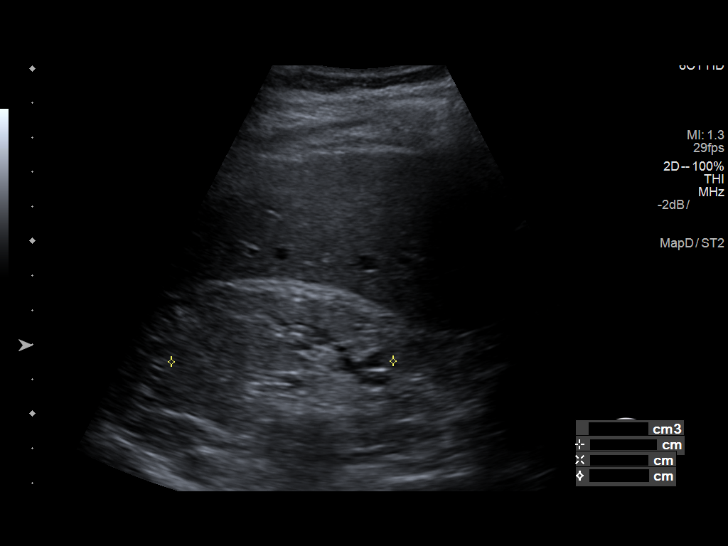
[im 22/40]
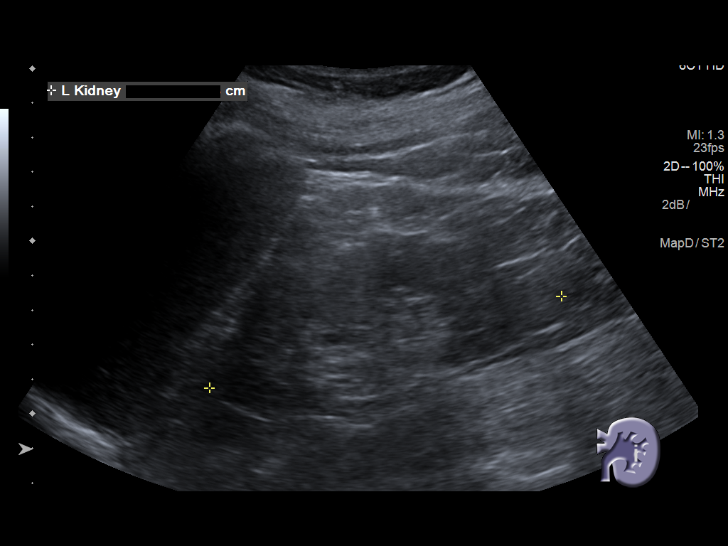
[im 25/40]
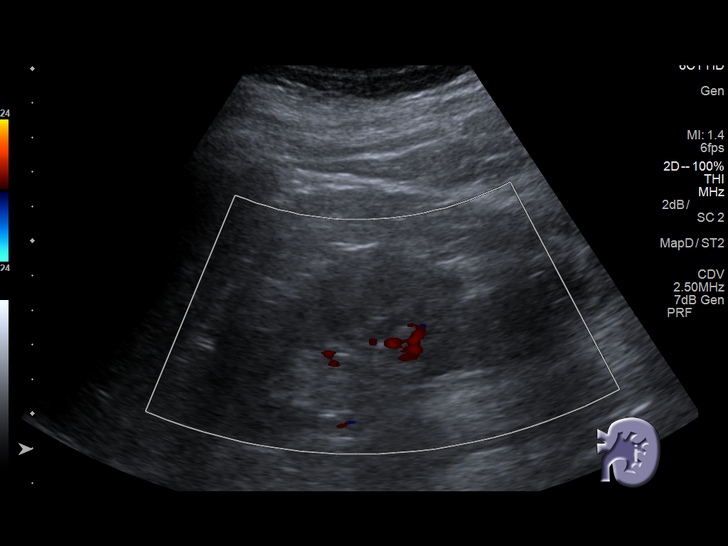
[im 27/40]
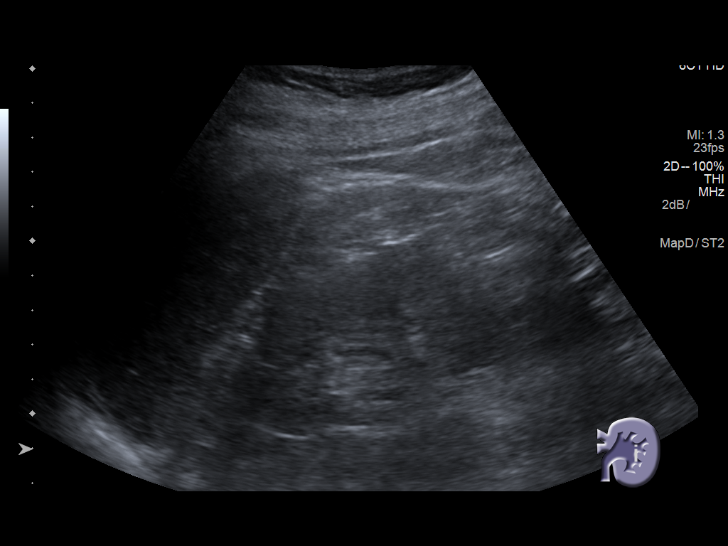
[im 30/40]
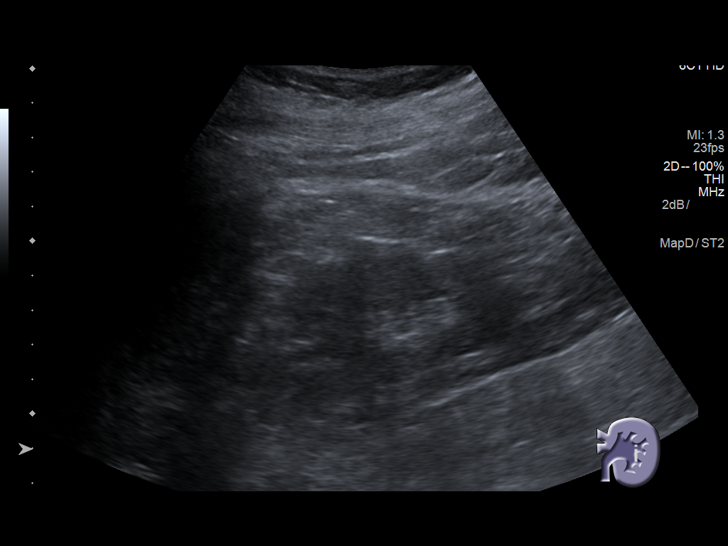
[im 33/40]
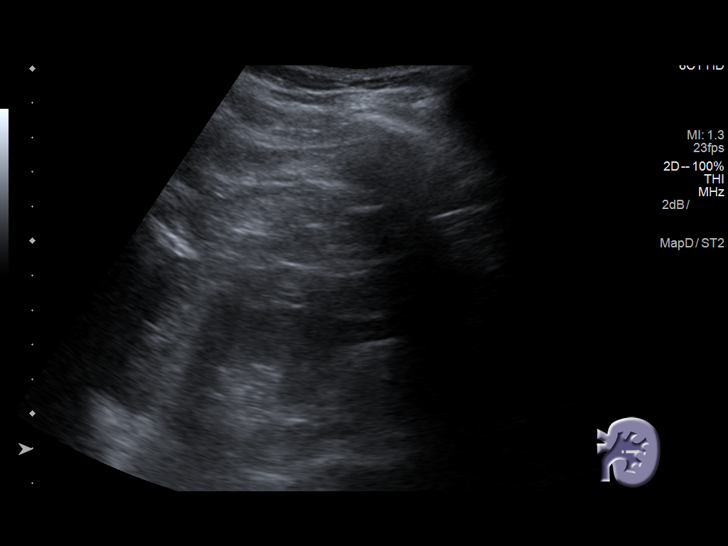
[im 36/40]
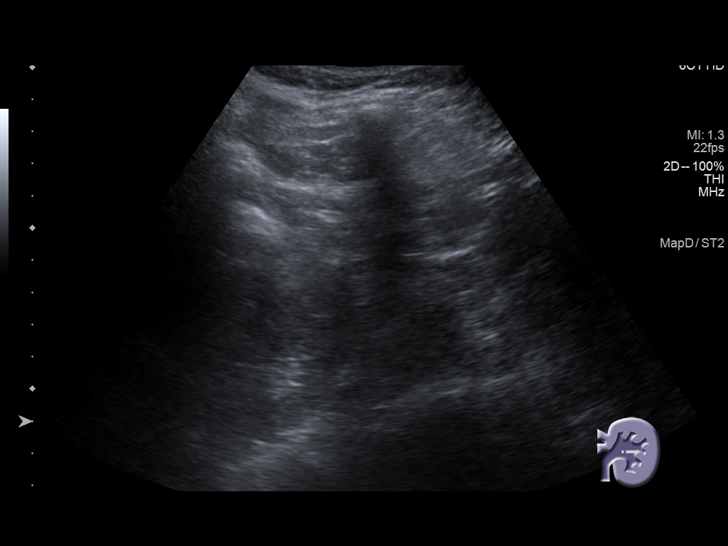
[im 40/40]
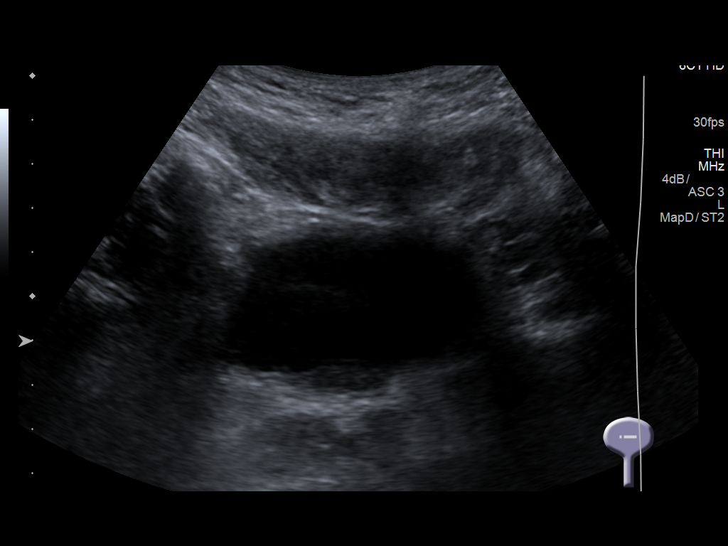

[14 of 25 positions shown; findings below may reference images not displayed]

FINDINGS: Right Kidney:

Renal measurements: 10.5 x 3.4 x 6.4 cm = volume: 118 mL .
Echogenicity is mildly increased. No mass or hydronephrosis
visualized.

Left Kidney:

Renal measurements: 10.5 x 4.7 x 5.6 cm = volume: 146 mL.
Echogenicity is mildly increased. No mass or hydronephrosis
visualized.

Bladder:

Appears normal for degree of bladder distention.
IMPRESSION: 1. Mildly increased renal echogenicity, consistent with medical
renal disease. No acute abnormality.

## 2020-07-25 DIAGNOSIS — R809 Proteinuria, unspecified: Secondary | ICD-10-CM | POA: Diagnosis not present

## 2020-07-25 DIAGNOSIS — I129 Hypertensive chronic kidney disease with stage 1 through stage 4 chronic kidney disease, or unspecified chronic kidney disease: Secondary | ICD-10-CM | POA: Diagnosis not present

## 2020-07-25 DIAGNOSIS — D631 Anemia in chronic kidney disease: Secondary | ICD-10-CM | POA: Diagnosis not present

## 2020-07-25 DIAGNOSIS — E211 Secondary hyperparathyroidism, not elsewhere classified: Secondary | ICD-10-CM | POA: Diagnosis not present

## 2020-07-25 DIAGNOSIS — N189 Chronic kidney disease, unspecified: Secondary | ICD-10-CM | POA: Diagnosis not present

## 2020-07-25 DIAGNOSIS — N184 Chronic kidney disease, stage 4 (severe): Secondary | ICD-10-CM | POA: Diagnosis not present

## 2020-09-18 DIAGNOSIS — N184 Chronic kidney disease, stage 4 (severe): Secondary | ICD-10-CM | POA: Diagnosis not present

## 2020-09-18 DIAGNOSIS — R809 Proteinuria, unspecified: Secondary | ICD-10-CM | POA: Diagnosis not present

## 2020-09-18 DIAGNOSIS — I129 Hypertensive chronic kidney disease with stage 1 through stage 4 chronic kidney disease, or unspecified chronic kidney disease: Secondary | ICD-10-CM | POA: Diagnosis not present

## 2020-09-18 DIAGNOSIS — E211 Secondary hyperparathyroidism, not elsewhere classified: Secondary | ICD-10-CM | POA: Diagnosis not present

## 2020-09-18 DIAGNOSIS — D631 Anemia in chronic kidney disease: Secondary | ICD-10-CM | POA: Diagnosis not present

## 2020-09-18 DIAGNOSIS — N189 Chronic kidney disease, unspecified: Secondary | ICD-10-CM | POA: Diagnosis not present

## 2020-10-04 DIAGNOSIS — N189 Chronic kidney disease, unspecified: Secondary | ICD-10-CM | POA: Diagnosis not present

## 2020-10-04 DIAGNOSIS — N184 Chronic kidney disease, stage 4 (severe): Secondary | ICD-10-CM | POA: Diagnosis not present

## 2020-10-04 DIAGNOSIS — E870 Hyperosmolality and hypernatremia: Secondary | ICD-10-CM | POA: Diagnosis not present

## 2020-10-04 DIAGNOSIS — D631 Anemia in chronic kidney disease: Secondary | ICD-10-CM | POA: Diagnosis not present

## 2020-10-04 DIAGNOSIS — R809 Proteinuria, unspecified: Secondary | ICD-10-CM | POA: Diagnosis not present

## 2020-10-04 DIAGNOSIS — I129 Hypertensive chronic kidney disease with stage 1 through stage 4 chronic kidney disease, or unspecified chronic kidney disease: Secondary | ICD-10-CM | POA: Diagnosis not present

## 2020-10-04 DIAGNOSIS — E211 Secondary hyperparathyroidism, not elsewhere classified: Secondary | ICD-10-CM | POA: Diagnosis not present

## 2020-12-27 DIAGNOSIS — D631 Anemia in chronic kidney disease: Secondary | ICD-10-CM | POA: Diagnosis not present

## 2020-12-27 DIAGNOSIS — N184 Chronic kidney disease, stage 4 (severe): Secondary | ICD-10-CM | POA: Diagnosis not present

## 2020-12-27 DIAGNOSIS — E211 Secondary hyperparathyroidism, not elsewhere classified: Secondary | ICD-10-CM | POA: Diagnosis not present

## 2020-12-27 DIAGNOSIS — N189 Chronic kidney disease, unspecified: Secondary | ICD-10-CM | POA: Diagnosis not present

## 2020-12-27 DIAGNOSIS — R809 Proteinuria, unspecified: Secondary | ICD-10-CM | POA: Diagnosis not present

## 2020-12-27 DIAGNOSIS — I129 Hypertensive chronic kidney disease with stage 1 through stage 4 chronic kidney disease, or unspecified chronic kidney disease: Secondary | ICD-10-CM | POA: Diagnosis not present

## 2021-01-04 DIAGNOSIS — I129 Hypertensive chronic kidney disease with stage 1 through stage 4 chronic kidney disease, or unspecified chronic kidney disease: Secondary | ICD-10-CM | POA: Diagnosis not present

## 2021-01-04 DIAGNOSIS — N189 Chronic kidney disease, unspecified: Secondary | ICD-10-CM | POA: Diagnosis not present

## 2021-01-04 DIAGNOSIS — D631 Anemia in chronic kidney disease: Secondary | ICD-10-CM | POA: Diagnosis not present

## 2021-01-04 DIAGNOSIS — R809 Proteinuria, unspecified: Secondary | ICD-10-CM | POA: Diagnosis not present

## 2021-01-04 DIAGNOSIS — N170 Acute kidney failure with tubular necrosis: Secondary | ICD-10-CM | POA: Diagnosis not present

## 2021-01-04 DIAGNOSIS — N184 Chronic kidney disease, stage 4 (severe): Secondary | ICD-10-CM | POA: Diagnosis not present

## 2021-01-04 DIAGNOSIS — E211 Secondary hyperparathyroidism, not elsewhere classified: Secondary | ICD-10-CM | POA: Diagnosis not present

## 2021-01-15 ENCOUNTER — Other Ambulatory Visit (HOSPITAL_COMMUNITY): Payer: Self-pay | Admitting: Nephrology

## 2021-01-15 DIAGNOSIS — N189 Chronic kidney disease, unspecified: Secondary | ICD-10-CM

## 2021-01-22 ENCOUNTER — Other Ambulatory Visit: Payer: Self-pay | Admitting: Family Medicine

## 2021-01-22 ENCOUNTER — Ambulatory Visit (HOSPITAL_COMMUNITY)
Admission: RE | Admit: 2021-01-22 | Discharge: 2021-01-22 | Disposition: A | Discharge status: Home / Self Care | Payer: Medicare HMO | Source: Ambulatory Visit | Attending: Nephrology | Admitting: Nephrology

## 2021-01-22 ENCOUNTER — Other Ambulatory Visit: Payer: Self-pay

## 2021-01-22 ENCOUNTER — Telehealth: Payer: Self-pay | Admitting: *Deleted

## 2021-01-22 ENCOUNTER — Other Ambulatory Visit (HOSPITAL_COMMUNITY): Payer: Self-pay | Admitting: Nephrology

## 2021-01-22 DIAGNOSIS — N184 Chronic kidney disease, stage 4 (severe): Secondary | ICD-10-CM | POA: Diagnosis not present

## 2021-01-22 DIAGNOSIS — R399 Unspecified symptoms and signs involving the genitourinary system: Secondary | ICD-10-CM

## 2021-01-22 NOTE — Telephone Encounter (Signed)
Requested medication (s) are due for refill today:   Yes  Requested medication (s) are on the active medication list:   Yes  Future visit scheduled:   No Voicemail left for pt to call in and make an appt.  A 30 day courtesy supply given earlier today (9/13) he is requesting a 90 day supply.   He hasn't called in and made an appt.   Last ordered: 01/22/2021 #30, 0 refills by Dr. Margarita Rana.  Returned because pt requesting a 90 day supply.      Requested Prescriptions  Pending Prescriptions Disp Refills   doxazosin (CARDURA) 8 MG tablet [Pharmacy Med Name: DOXAZOSIN 8MG  TABLETS] 90 tablet     Sig: TAKE 1 TABLET(8 MG) BY MOUTH DAILY     Cardiovascular:  Alpha Blockers Failed - 01/22/2021 10:42 AM      Failed - Valid encounter within last 6 months    Recent Outpatient Visits           8 months ago [place], Charlane Ferretti, MD   11 months ago Stage 4 chronic kidney disease [facility])   [place] [name], MD   2 years ago Stage 4 chronic kidney disease [facility])   [place] [facility] [name], MD   2 years ago Essential hypertension, benign   [place] [facility] [place], Charlane Ferretti, MD   2 years ago Screening for colon cancer   [place], Charlane Ferretti, MD              Passed - Last BP in normal range    BP Readings from Last 1 Encounters:  01/08/20 116/75

## 2021-01-22 NOTE — Telephone Encounter (Signed)
I called pt and left a voicemail asking him to call [facility] and make his 6 month check up appt for RX refills. A 30 day courtesy supply was given.

## 2021-01-24 DIAGNOSIS — I129 Hypertensive chronic kidney disease with stage 1 through stage 4 chronic kidney disease, or unspecified chronic kidney disease: Secondary | ICD-10-CM | POA: Diagnosis not present

## 2021-01-24 DIAGNOSIS — N184 Chronic kidney disease, stage 4 (severe): Secondary | ICD-10-CM | POA: Diagnosis not present

## 2021-01-24 DIAGNOSIS — E211 Secondary hyperparathyroidism, not elsewhere classified: Secondary | ICD-10-CM | POA: Diagnosis not present

## 2021-01-24 DIAGNOSIS — D631 Anemia in chronic kidney disease: Secondary | ICD-10-CM | POA: Diagnosis not present

## 2021-01-24 DIAGNOSIS — N170 Acute kidney failure with tubular necrosis: Secondary | ICD-10-CM | POA: Diagnosis not present

## 2021-01-24 DIAGNOSIS — N189 Chronic kidney disease, unspecified: Secondary | ICD-10-CM | POA: Diagnosis not present

## 2021-01-24 DIAGNOSIS — R809 Proteinuria, unspecified: Secondary | ICD-10-CM | POA: Diagnosis not present

## 2021-01-31 ENCOUNTER — Other Ambulatory Visit: Payer: Self-pay

## 2021-01-31 ENCOUNTER — Ambulatory Visit (HOSPITAL_COMMUNITY)
Admission: RE | Admit: 2021-01-31 | Discharge: 2021-01-31 | Disposition: A | Discharge status: Home / Self Care | Payer: Medicare HMO | Source: Ambulatory Visit | Attending: Nephrology | Admitting: Nephrology

## 2021-01-31 DIAGNOSIS — E785 Hyperlipidemia, unspecified: Secondary | ICD-10-CM | POA: Diagnosis not present

## 2021-01-31 DIAGNOSIS — R7303 Prediabetes: Secondary | ICD-10-CM | POA: Diagnosis not present

## 2021-01-31 DIAGNOSIS — I1310 Hypertensive heart and chronic kidney disease without heart failure, with stage 1 through stage 4 chronic kidney disease, or unspecified chronic kidney disease: Secondary | ICD-10-CM | POA: Insufficient documentation

## 2021-01-31 DIAGNOSIS — I340 Nonrheumatic mitral (valve) insufficiency: Secondary | ICD-10-CM

## 2021-01-31 DIAGNOSIS — I361 Nonrheumatic tricuspid (valve) insufficiency: Secondary | ICD-10-CM

## 2021-01-31 DIAGNOSIS — I081 Rheumatic disorders of both mitral and tricuspid valves: Secondary | ICD-10-CM | POA: Diagnosis not present

## 2021-01-31 DIAGNOSIS — N189 Chronic kidney disease, unspecified: Secondary | ICD-10-CM

## 2021-01-31 LAB — ECHOCARDIOGRAM COMPLETE
AR max vel: 3.54 cm2
AV Area VTI: 3.68 cm2
AV Area mean vel: 3.51 cm2
AV Mean grad: 3.5 mmHg
AV Peak grad: 5.6 mmHg
Ao pk vel: 1.18 m/s
Area-P 1/2: 2.66 cm2
MV M vel: 5.25 m/s
MV Peak grad: 110.3 mmHg
S' Lateral: 2.7 cm

## 2021-01-31 NOTE — Progress Notes (Signed)
*  PRELIMINARY RESULTS* Echocardiogram 2D Echocardiogram has been performed.  Samuel Germany 01/31/2021, 10:28 AM

## 2021-02-01 DIAGNOSIS — N189 Chronic kidney disease, unspecified: Secondary | ICD-10-CM | POA: Diagnosis not present

## 2021-02-01 DIAGNOSIS — D631 Anemia in chronic kidney disease: Secondary | ICD-10-CM | POA: Diagnosis not present

## 2021-02-01 DIAGNOSIS — E211 Secondary hyperparathyroidism, not elsewhere classified: Secondary | ICD-10-CM | POA: Diagnosis not present

## 2021-02-01 DIAGNOSIS — I129 Hypertensive chronic kidney disease with stage 1 through stage 4 chronic kidney disease, or unspecified chronic kidney disease: Secondary | ICD-10-CM | POA: Diagnosis not present

## 2021-02-01 DIAGNOSIS — N184 Chronic kidney disease, stage 4 (severe): Secondary | ICD-10-CM | POA: Diagnosis not present

## 2021-02-01 DIAGNOSIS — R809 Proteinuria, unspecified: Secondary | ICD-10-CM | POA: Diagnosis not present

## 2021-02-21 ENCOUNTER — Other Ambulatory Visit: Payer: Self-pay | Admitting: Family Medicine

## 2021-02-21 DIAGNOSIS — R399 Unspecified symptoms and signs involving the genitourinary system: Secondary | ICD-10-CM

## 2021-02-21 NOTE — Telephone Encounter (Signed)
Pt called, wife answered and was able to verify pt information with pt, advised wife that I was calling to schedule appt to refill medications and wife stated pt nephologist refills pts medications. She also stated that pt has new pcp and will be seeing them in Jan 2023. Pt has nephrology appt 03/21/21 and didn't need any refills for the medication requested. Will refuse d/t this reason.  Requested Prescriptions  Pending Prescriptions Disp Refills   doxazosin (CARDURA) 8 MG tablet [Pharmacy Med Name: DOXAZOSIN 8MG  TABLETS] 30 tablet 0    Sig: TAKE 1 TABLET(8 MG) BY MOUTH DAILY     Cardiovascular:  Alpha Blockers Failed - 02/21/2021  7:13 AM      Failed - Valid encounter within last 6 months    Recent Outpatient Visits           9 months ago [place], [name], MD   1 year ago Stage 4 chronic kidney disease [facility])   [place] [name], MD   2 years ago Stage 4 chronic kidney disease [facility])    [facility] [name], MD   2 years ago Essential hypertension, benign   [place], Charlane Ferretti, MD   2 years ago Screening for colon cancer   [place], Charlane Ferretti, MD              Passed - Last BP in normal range    BP Readings from Last 1 Encounters:  01/08/20 116/75

## 2021-03-17 ENCOUNTER — Other Ambulatory Visit: Payer: Self-pay | Admitting: Family Medicine

## 2021-03-17 DIAGNOSIS — E7800 Pure hypercholesterolemia, unspecified: Secondary | ICD-10-CM

## 2021-03-17 NOTE — Telephone Encounter (Signed)
Requested medication (s) are due for refill today: yes  Requested medication (s) are on the active medication list: yes  Last refill:  05/22/20 #90 1 RF  Future visit scheduled: no  Notes to clinic:  overdue labs   Requested Prescriptions  Pending Prescriptions Disp Refills   pravastatin (PRAVACHOL) 40 MG tablet [Pharmacy Med Name: PRAVASTATIN 40MG  TABLETS] 90 tablet 1    Sig: TAKE 1 TABLET(40 MG) BY MOUTH DAILY     Cardiovascular:  Antilipid - Statins Failed - 03/17/2021  9:02 AM      Failed - Total Cholesterol in normal range and within 360 days    Cholesterol, Total  Date Value Ref Range Status  06/09/2018 138 100 - 199 mg/dL Final          Failed - LDL in normal range and within 360 days    LDL Calculated  Date Value Ref Range Status  06/09/2018 84 0 - 99 mg/dL Final   Direct LDL  Date Value Ref Range Status  10/23/2009 97 mg/dL Final    Comment:    See lab report for associated comment(s)          Failed - HDL in normal range and within 360 days    HDL  Date Value Ref Range Status  06/09/2018 40 >39 mg/dL Final          Failed - Triglycerides in normal range and within 360 days    Triglycerides  Date Value Ref Range Status  06/09/2018 71 0 - 149 mg/dL Final          Passed - Patient is not pregnant      Passed - Valid encounter within last 12 months    Recent Outpatient Visits           9 months ago [name], [name], MD   1 year ago Stage 4 chronic kidney disease ([name])   [name], [name], MD   2 years ago Stage 4 chronic kidney disease [name])   [name] [name], MD   2 years ago Essential hypertension, benign   [name], [name], MD   2 years ago Screening for colon cancer   [name] [name], MD

## 2021-03-20 ENCOUNTER — Encounter: Payer: Self-pay | Admitting: Physician Assistant

## 2021-03-20 ENCOUNTER — Ambulatory Visit: Payer: Medicare HMO | Attending: Physician Assistant | Admitting: Physician Assistant

## 2021-03-20 ENCOUNTER — Other Ambulatory Visit: Payer: Self-pay

## 2021-03-20 VITALS — BP 124/82 | HR 70 | Resp 16 | Wt 229.8 lb

## 2021-03-20 DIAGNOSIS — N401 Enlarged prostate with lower urinary tract symptoms: Secondary | ICD-10-CM | POA: Diagnosis not present

## 2021-03-20 DIAGNOSIS — H6123 Impacted cerumen, bilateral: Secondary | ICD-10-CM | POA: Diagnosis not present

## 2021-03-20 DIAGNOSIS — R399 Unspecified symptoms and signs involving the genitourinary system: Secondary | ICD-10-CM

## 2021-03-20 DIAGNOSIS — N529 Male erectile dysfunction, unspecified: Secondary | ICD-10-CM

## 2021-03-20 DIAGNOSIS — I10 Essential (primary) hypertension: Secondary | ICD-10-CM

## 2021-03-20 DIAGNOSIS — N184 Chronic kidney disease, stage 4 (severe): Secondary | ICD-10-CM

## 2021-03-20 DIAGNOSIS — R7303 Prediabetes: Secondary | ICD-10-CM

## 2021-03-20 DIAGNOSIS — E7800 Pure hypercholesterolemia, unspecified: Secondary | ICD-10-CM | POA: Diagnosis not present

## 2021-03-20 LAB — POCT GLYCOSYLATED HEMOGLOBIN (HGB A1C): HbA1c, POC (controlled diabetic range): 5.7 % (ref 0.0–7.0)

## 2021-03-20 LAB — GLUCOSE, POCT (MANUAL RESULT ENTRY): POC Glucose: 151 mg/dl — AB (ref 70–99)

## 2021-03-20 MED ORDER — AMLODIPINE BESYLATE 10 MG PO TABS: ORAL_TABLET | ORAL | 1 refills | Status: DC

## 2021-03-20 MED ORDER — DOXAZOSIN MESYLATE 8 MG PO TABS: ORAL_TABLET | ORAL | 1 refills | Status: DC

## 2021-03-20 MED ORDER — FINASTERIDE 5 MG PO TABS: 5.0000 mg | ORAL_TABLET | Freq: Every day | ORAL | 1 refills | Status: DC

## 2021-03-20 MED ORDER — SILDENAFIL CITRATE 50 MG PO TABS: 50.0000 mg | ORAL_TABLET | Freq: Every day | ORAL | 1 refills | Status: DC | PRN

## 2021-03-20 MED ORDER — CALCITRIOL 0.5 MCG PO CAPS: 0.5000 ug | ORAL_CAPSULE | Freq: Every day | ORAL | 1 refills | Status: DC

## 2021-03-20 MED ORDER — PRAVASTATIN SODIUM 40 MG PO TABS: ORAL_TABLET | ORAL | 1 refills | Status: DC

## 2021-03-20 MED ORDER — LABETALOL HCL 300 MG PO TABS: 300.0000 mg | ORAL_TABLET | Freq: Two times a day (BID) | ORAL | 1 refills | Status: DC

## 2021-03-20 NOTE — Progress Notes (Signed)
Patient ID: Brent Jimenez, male   DOB: 09/25/1955, 65 y.o.   MRN: [id number]   Kano Heckmann, is a 65 y.o. male  HER:[id number]  EHU:[id number]  DOB - April 22, 1956  Chief Complaint  Patient presents with   Medication Refill   Ear Fullness       Subjective:   Brent Jimenez is a 65 y.o. male here today for med RF and ear congestion.  He sees his kidney doctor regularly.  Needs viagra RF and wants urology referral.  No CP/HA/SOB/dizziness.  B ears popping and congested.  No ear pain.  Had to have them cleaned out by ENT about 4 years ago.  No problems updated.  ALLERGIES: No Known Allergies  PAST MEDICAL HISTORY: Past Medical History:  Diagnosis Date   Brain bleed ([place]) 2007   expressive asphasia   Chronic kidney disease    Hypercholesteremia    Hypertension    Stroke [facility] [place])     MEDICATIONS AT HOME: Prior to Admission medications   Medication Sig Start Date End Date Taking? Authorizing Provider  amLODipine (NORVASC) 10 MG tablet TAKE 1 TABLET(10 MG) BY MOUTH DAILY 03/20/21   Argentina Donovan, PA-C  calcitRIOL (ROCALTROL) 0.5 MCG capsule Take 1 capsule (0.5 mcg total) by mouth daily. 03/20/21   Argentina Donovan, PA-C  doxazosin (CARDURA) 8 MG tablet 1 daily 03/20/21   Argentina Donovan, PA-C  finasteride (PROSCAR) 5 MG tablet Take 1 tablet (5 mg total) by mouth daily. 03/20/21   Argentina Donovan, PA-C  labetalol (NORMODYNE) 300 MG tablet Take 1 tablet (300 mg total) by mouth 2 (two) times daily. 03/20/21   Argentina Donovan, PA-C  pravastatin (PRAVACHOL) 40 MG tablet TAKE 1 TABLET(40 MG) BY MOUTH DAILY 03/20/21   Argentina Donovan, PA-C  sildenafil (VIAGRA) 50 MG tablet Take 1 tablet (50 mg total) by mouth daily as needed for erectile dysfunction. At least 24 hours between doses 03/20/21   Leshon Armistead, Levada Dy M, PA-C    ROS: Neg resp Neg cardiac Neg GI Neg MS Neg psych Neg neuro  Objective:   Vitals:   03/20/21 1511  BP: 124/82  Pulse: 70  Resp: 16  SpO2: 97%  Weight:  229 lb 12.8 oz (104.2 kg)   Exam General appearance : Awake, alert, not in any distress. Speech Clear. Not toxic looking HEENT: Atraumatic and Normocephalic B canals blocked by cerumen completely Neck: Supple, no JVD. No cervical lymphadenopathy.  Chest: Good air entry bilaterally, CTAB.  No rales/rhonchi/wheezing CVS: S1 S2 regular, no murmurs.  Extremities: B/L Lower Ext shows no edema, both legs are warm to touch Neurology: Awake alert, and oriented X 3, CN II-XII intact, Non focal Skin: No Rash  Data Review Lab Results  Component Value Date   HGBA1C 5.8 06/09/2018   HGBA1C 6.0 05/13/2017   HGBA1C 6.0 (H) 09/14/2014    Assessment & Plan   1. Prediabetes A1C 5.7 today I have had a lengthy discussion and provided education about insulin resistance and the intake of too much sugar/refined carbohydrates.  I have advised the patient to work at a goal of eliminating sugary drinks, candy, desserts, sweets, refined sugars, processed foods, and white carbohydrates.  The patient expresses understanding.  - Glucose (CBG) - HgB A1c - Comprehensive metabolic panel  2. Essential hypertension, benign controlled - Comprehensive metabolic panel - CBC with Differential/Platelet - amLODipine (NORVASC) 10 MG tablet; TAKE 1 TABLET(10 MG) BY MOUTH DAILY  Dispense: 90 tablet; Refill: 1 - labetalol (NORMODYNE) 300 MG  tablet; Take 1 tablet (300 mg total) by mouth 2 (two) times daily.  Dispense: 180 tablet; Refill: 1  3. Stage 4 chronic kidney disease ([place]) Continue follow up with nephrology - Comprehensive metabolic panel - CBC with Differential/Platelet - calcitRIOL (ROCALTROL) 0.5 MCG capsule; Take 1 capsule (0.5 mcg total) by mouth daily.  Dispense: 90 capsule; Refill: 1  4. Hypercholesteremia - Comprehensive metabolic panel - Lipid panel - pravastatin (PRAVACHOL) 40 MG tablet; TAKE 1 TABLET(40 MG) BY MOUTH DAILY  Dispense: 90 tablet; Refill: 1  5. Lower urinary tract symptoms -  finasteride (PROSCAR) 5 MG tablet; Take 1 tablet (5 mg total) by mouth daily.  Dispense: 90 tablet; Refill: 1 - doxazosin (CARDURA) 8 MG tablet; 1 daily  Dispense: 90 tablet; Refill: 1 - Ambulatory referral to Urology  6. Erectile dysfunction, unspecified erectile dysfunction type - sildenafil (VIAGRA) 50 MG tablet; Take 1 tablet (50 mg total) by mouth daily as needed for erectile dysfunction. At least 24 hours between doses  Dispense: 10 tablet; Refill: 1 - Ambulatory referral to Urology  7. Benign prostatic hyperplasia with lower urinary tract symptoms, symptom details unspecified - CBC with Differential/Platelet - Ambulatory referral to Urology  8. Bilateral impacted cerumen - Ambulatory referral to ENT  Patient have been counseled extensively about nutrition and exercise. Other issues discussed during this visit include: low cholesterol diet, weight control and daily exercise, foot care, annual eye examinations at Ophthalmology, importance of adherence with medications and regular follow-up. We also discussed long term complications of uncontrolled diabetes and hypertension.   Return in about 6 months (around 09/17/2021) for PCP for chronic conditions.  The patient was given clear instructions to go to ER or return to medical center if symptoms don't improve, worsen or new problems develop. The patient verbalized understanding. The patient was told to call to get lab results if they haven't heard anything in the next week.      Freeman Caldron, PA-C [facility] and [place], [place]   03/20/2021, 3:20 PM

## 2021-03-22 ENCOUNTER — Telehealth: Payer: Self-pay | Admitting: *Deleted

## 2021-03-22 DIAGNOSIS — I129 Hypertensive chronic kidney disease with stage 1 through stage 4 chronic kidney disease, or unspecified chronic kidney disease: Secondary | ICD-10-CM | POA: Diagnosis not present

## 2021-03-22 DIAGNOSIS — E211 Secondary hyperparathyroidism, not elsewhere classified: Secondary | ICD-10-CM | POA: Diagnosis not present

## 2021-03-22 DIAGNOSIS — N189 Chronic kidney disease, unspecified: Secondary | ICD-10-CM | POA: Diagnosis not present

## 2021-03-22 DIAGNOSIS — D631 Anemia in chronic kidney disease: Secondary | ICD-10-CM | POA: Diagnosis not present

## 2021-03-22 DIAGNOSIS — R809 Proteinuria, unspecified: Secondary | ICD-10-CM | POA: Diagnosis not present

## 2021-03-22 DIAGNOSIS — N184 Chronic kidney disease, stage 4 (severe): Secondary | ICD-10-CM | POA: Diagnosis not present

## 2021-03-22 NOTE — Telephone Encounter (Signed)
Patient's wife is calling to report the ENT referral is 2 weeks out. She is requesting assistance with a sooner appointment due to the pain her husband is having with his ear.

## 2021-03-25 DIAGNOSIS — H6123 Impacted cerumen, bilateral: Secondary | ICD-10-CM

## 2021-03-25 DIAGNOSIS — R948 Abnormal results of function studies of other organs and systems: Secondary | ICD-10-CM | POA: Diagnosis not present

## 2021-03-25 DIAGNOSIS — N528 Other male erectile dysfunction: Secondary | ICD-10-CM | POA: Diagnosis not present

## 2021-03-25 DIAGNOSIS — R351 Nocturia: Secondary | ICD-10-CM | POA: Diagnosis not present

## 2021-03-25 DIAGNOSIS — N184 Chronic kidney disease, stage 4 (severe): Secondary | ICD-10-CM | POA: Diagnosis not present

## 2021-03-25 DIAGNOSIS — N401 Enlarged prostate with lower urinary tract symptoms: Secondary | ICD-10-CM | POA: Diagnosis not present

## 2021-03-25 HISTORY — DX: Impacted cerumen, bilateral: H61.23

## 2021-03-27 ENCOUNTER — Telehealth: Payer: Self-pay | Admitting: Family Medicine

## 2021-03-27 NOTE — Telephone Encounter (Signed)
Left vm for patient to call [phone] to schedule a nurse visit

## 2021-03-27 NOTE — Telephone Encounter (Signed)
Yes he can be placed on the nurse schedule either for a Thursday afternoon or Friday.

## 2021-03-27 NOTE — Telephone Encounter (Signed)
Copied from [place] [id number]. Topic: General - Inquiry >> Mar 22, 2021  8:41 AM Scherrie Gerlach wrote: Reason for CRM: pt states the dr at ENT could not see him until 11/29.  He did not make the appt.  Wants to know can he come bck her and get his ears cleaned out?  He cannot wait that long.   Can this patient be added to the nurse schedule for an ear flush or do he need another Dr. Network engineer. He saw Levada Dy on 11/9 for an ear infection

## 2021-03-29 DIAGNOSIS — I129 Hypertensive chronic kidney disease with stage 1 through stage 4 chronic kidney disease, or unspecified chronic kidney disease: Secondary | ICD-10-CM | POA: Diagnosis not present

## 2021-03-29 DIAGNOSIS — R809 Proteinuria, unspecified: Secondary | ICD-10-CM | POA: Diagnosis not present

## 2021-03-29 DIAGNOSIS — E211 Secondary hyperparathyroidism, not elsewhere classified: Secondary | ICD-10-CM | POA: Diagnosis not present

## 2021-03-29 DIAGNOSIS — N189 Chronic kidney disease, unspecified: Secondary | ICD-10-CM | POA: Diagnosis not present

## 2021-03-29 DIAGNOSIS — N184 Chronic kidney disease, stage 4 (severe): Secondary | ICD-10-CM | POA: Diagnosis not present

## 2021-03-29 DIAGNOSIS — D631 Anemia in chronic kidney disease: Secondary | ICD-10-CM | POA: Diagnosis not present

## 2021-05-20 DIAGNOSIS — N184 Chronic kidney disease, stage 4 (severe): Secondary | ICD-10-CM | POA: Diagnosis not present

## 2021-05-20 DIAGNOSIS — I10 Essential (primary) hypertension: Secondary | ICD-10-CM | POA: Diagnosis not present

## 2021-05-20 DIAGNOSIS — E7800 Pure hypercholesterolemia, unspecified: Secondary | ICD-10-CM | POA: Diagnosis not present

## 2021-05-20 DIAGNOSIS — Z8679 Personal history of other diseases of the circulatory system: Secondary | ICD-10-CM | POA: Diagnosis not present

## 2021-05-28 DIAGNOSIS — E211 Secondary hyperparathyroidism, not elsewhere classified: Secondary | ICD-10-CM | POA: Diagnosis not present

## 2021-05-28 DIAGNOSIS — N189 Chronic kidney disease, unspecified: Secondary | ICD-10-CM | POA: Diagnosis not present

## 2021-05-28 DIAGNOSIS — D631 Anemia in chronic kidney disease: Secondary | ICD-10-CM | POA: Diagnosis not present

## 2021-05-28 DIAGNOSIS — N184 Chronic kidney disease, stage 4 (severe): Secondary | ICD-10-CM | POA: Diagnosis not present

## 2021-05-28 DIAGNOSIS — R809 Proteinuria, unspecified: Secondary | ICD-10-CM | POA: Diagnosis not present

## 2021-05-28 DIAGNOSIS — I129 Hypertensive chronic kidney disease with stage 1 through stage 4 chronic kidney disease, or unspecified chronic kidney disease: Secondary | ICD-10-CM | POA: Diagnosis not present

## 2021-06-04 DIAGNOSIS — D631 Anemia in chronic kidney disease: Secondary | ICD-10-CM | POA: Diagnosis not present

## 2021-06-04 DIAGNOSIS — N189 Chronic kidney disease, unspecified: Secondary | ICD-10-CM | POA: Diagnosis not present

## 2021-06-04 DIAGNOSIS — I129 Hypertensive chronic kidney disease with stage 1 through stage 4 chronic kidney disease, or unspecified chronic kidney disease: Secondary | ICD-10-CM | POA: Diagnosis not present

## 2021-06-04 DIAGNOSIS — E870 Hyperosmolality and hypernatremia: Secondary | ICD-10-CM | POA: Diagnosis not present

## 2021-06-04 DIAGNOSIS — N184 Chronic kidney disease, stage 4 (severe): Secondary | ICD-10-CM | POA: Diagnosis not present

## 2021-06-04 DIAGNOSIS — R809 Proteinuria, unspecified: Secondary | ICD-10-CM | POA: Diagnosis not present

## 2021-06-04 DIAGNOSIS — E211 Secondary hyperparathyroidism, not elsewhere classified: Secondary | ICD-10-CM | POA: Diagnosis not present

## 2021-07-31 DIAGNOSIS — I129 Hypertensive chronic kidney disease with stage 1 through stage 4 chronic kidney disease, or unspecified chronic kidney disease: Secondary | ICD-10-CM | POA: Diagnosis not present

## 2021-07-31 DIAGNOSIS — D631 Anemia in chronic kidney disease: Secondary | ICD-10-CM | POA: Diagnosis not present

## 2021-07-31 DIAGNOSIS — E870 Hyperosmolality and hypernatremia: Secondary | ICD-10-CM | POA: Diagnosis not present

## 2021-07-31 DIAGNOSIS — E211 Secondary hyperparathyroidism, not elsewhere classified: Secondary | ICD-10-CM | POA: Diagnosis not present

## 2021-07-31 DIAGNOSIS — N184 Chronic kidney disease, stage 4 (severe): Secondary | ICD-10-CM | POA: Diagnosis not present

## 2021-07-31 DIAGNOSIS — R809 Proteinuria, unspecified: Secondary | ICD-10-CM | POA: Diagnosis not present

## 2021-07-31 DIAGNOSIS — N189 Chronic kidney disease, unspecified: Secondary | ICD-10-CM | POA: Diagnosis not present

## 2021-08-07 DIAGNOSIS — K061 Gingival enlargement: Secondary | ICD-10-CM | POA: Diagnosis not present

## 2021-08-07 DIAGNOSIS — I129 Hypertensive chronic kidney disease with stage 1 through stage 4 chronic kidney disease, or unspecified chronic kidney disease: Secondary | ICD-10-CM | POA: Diagnosis not present

## 2021-08-07 DIAGNOSIS — D631 Anemia in chronic kidney disease: Secondary | ICD-10-CM | POA: Diagnosis not present

## 2021-08-07 DIAGNOSIS — R809 Proteinuria, unspecified: Secondary | ICD-10-CM | POA: Diagnosis not present

## 2021-08-07 DIAGNOSIS — E211 Secondary hyperparathyroidism, not elsewhere classified: Secondary | ICD-10-CM | POA: Diagnosis not present

## 2021-08-07 DIAGNOSIS — N170 Acute kidney failure with tubular necrosis: Secondary | ICD-10-CM | POA: Diagnosis not present

## 2021-08-07 DIAGNOSIS — N184 Chronic kidney disease, stage 4 (severe): Secondary | ICD-10-CM | POA: Diagnosis not present

## 2021-08-15 DIAGNOSIS — D631 Anemia in chronic kidney disease: Secondary | ICD-10-CM | POA: Diagnosis not present

## 2021-08-15 DIAGNOSIS — N19 Unspecified kidney failure: Secondary | ICD-10-CM | POA: Diagnosis not present

## 2021-08-15 DIAGNOSIS — N184 Chronic kidney disease, stage 4 (severe): Secondary | ICD-10-CM | POA: Diagnosis not present

## 2021-08-15 DIAGNOSIS — R809 Proteinuria, unspecified: Secondary | ICD-10-CM | POA: Diagnosis not present

## 2021-08-15 DIAGNOSIS — E211 Secondary hyperparathyroidism, not elsewhere classified: Secondary | ICD-10-CM | POA: Diagnosis not present

## 2021-08-15 DIAGNOSIS — I129 Hypertensive chronic kidney disease with stage 1 through stage 4 chronic kidney disease, or unspecified chronic kidney disease: Secondary | ICD-10-CM | POA: Diagnosis not present

## 2021-08-15 DIAGNOSIS — N189 Chronic kidney disease, unspecified: Secondary | ICD-10-CM | POA: Diagnosis not present

## 2021-08-22 DIAGNOSIS — N170 Acute kidney failure with tubular necrosis: Secondary | ICD-10-CM | POA: Diagnosis not present

## 2021-08-22 DIAGNOSIS — D631 Anemia in chronic kidney disease: Secondary | ICD-10-CM | POA: Diagnosis not present

## 2021-08-22 DIAGNOSIS — E211 Secondary hyperparathyroidism, not elsewhere classified: Secondary | ICD-10-CM | POA: Diagnosis not present

## 2021-08-22 DIAGNOSIS — I129 Hypertensive chronic kidney disease with stage 1 through stage 4 chronic kidney disease, or unspecified chronic kidney disease: Secondary | ICD-10-CM | POA: Diagnosis not present

## 2021-08-22 DIAGNOSIS — N184 Chronic kidney disease, stage 4 (severe): Secondary | ICD-10-CM | POA: Diagnosis not present

## 2021-08-22 DIAGNOSIS — N189 Chronic kidney disease, unspecified: Secondary | ICD-10-CM | POA: Diagnosis not present

## 2021-08-22 DIAGNOSIS — R809 Proteinuria, unspecified: Secondary | ICD-10-CM | POA: Diagnosis not present

## 2021-08-29 DIAGNOSIS — E7800 Pure hypercholesterolemia, unspecified: Secondary | ICD-10-CM | POA: Diagnosis not present

## 2021-08-29 DIAGNOSIS — Z125 Encounter for screening for malignant neoplasm of prostate: Secondary | ICD-10-CM | POA: Diagnosis not present

## 2021-08-29 DIAGNOSIS — I10 Essential (primary) hypertension: Secondary | ICD-10-CM | POA: Diagnosis not present

## 2021-08-29 DIAGNOSIS — N184 Chronic kidney disease, stage 4 (severe): Secondary | ICD-10-CM | POA: Diagnosis not present

## 2021-09-05 ENCOUNTER — Other Ambulatory Visit: Payer: Self-pay | Admitting: Nephrology

## 2021-09-05 ENCOUNTER — Other Ambulatory Visit (HOSPITAL_COMMUNITY): Payer: Self-pay | Admitting: Nephrology

## 2021-09-05 DIAGNOSIS — N184 Chronic kidney disease, stage 4 (severe): Secondary | ICD-10-CM

## 2021-09-05 DIAGNOSIS — N170 Acute kidney failure with tubular necrosis: Secondary | ICD-10-CM

## 2021-09-09 ENCOUNTER — Other Ambulatory Visit: Payer: Self-pay | Admitting: Physician Assistant

## 2021-09-09 ENCOUNTER — Other Ambulatory Visit: Payer: Self-pay

## 2021-09-09 DIAGNOSIS — E7800 Pure hypercholesterolemia, unspecified: Secondary | ICD-10-CM

## 2021-09-09 MED ORDER — PRAVASTATIN SODIUM 40 MG PO TABS: ORAL_TABLET | ORAL | 0 refills | Status: DC

## 2021-09-12 DIAGNOSIS — D631 Anemia in chronic kidney disease: Secondary | ICD-10-CM | POA: Diagnosis not present

## 2021-09-12 DIAGNOSIS — N184 Chronic kidney disease, stage 4 (severe): Secondary | ICD-10-CM | POA: Diagnosis not present

## 2021-09-12 DIAGNOSIS — N170 Acute kidney failure with tubular necrosis: Secondary | ICD-10-CM | POA: Diagnosis not present

## 2021-09-12 DIAGNOSIS — N189 Chronic kidney disease, unspecified: Secondary | ICD-10-CM | POA: Diagnosis not present

## 2021-09-12 DIAGNOSIS — E211 Secondary hyperparathyroidism, not elsewhere classified: Secondary | ICD-10-CM | POA: Diagnosis not present

## 2021-09-12 DIAGNOSIS — I129 Hypertensive chronic kidney disease with stage 1 through stage 4 chronic kidney disease, or unspecified chronic kidney disease: Secondary | ICD-10-CM | POA: Diagnosis not present

## 2021-09-12 DIAGNOSIS — R809 Proteinuria, unspecified: Secondary | ICD-10-CM | POA: Diagnosis not present

## 2021-09-18 ENCOUNTER — Ambulatory Visit (HOSPITAL_COMMUNITY)
Admission: RE | Admit: 2021-09-18 | Discharge: 2021-09-18 | Disposition: A | Discharge status: Home / Self Care | Payer: Medicare HMO | Source: Ambulatory Visit | Attending: Nephrology | Admitting: Nephrology

## 2021-09-18 DIAGNOSIS — N184 Chronic kidney disease, stage 4 (severe): Secondary | ICD-10-CM | POA: Diagnosis not present

## 2021-09-18 DIAGNOSIS — N189 Chronic kidney disease, unspecified: Secondary | ICD-10-CM | POA: Diagnosis not present

## 2021-09-18 DIAGNOSIS — N170 Acute kidney failure with tubular necrosis: Secondary | ICD-10-CM | POA: Insufficient documentation

## 2021-10-02 DIAGNOSIS — R809 Proteinuria, unspecified: Secondary | ICD-10-CM | POA: Diagnosis not present

## 2021-10-02 DIAGNOSIS — N170 Acute kidney failure with tubular necrosis: Secondary | ICD-10-CM | POA: Diagnosis not present

## 2021-10-02 DIAGNOSIS — I129 Hypertensive chronic kidney disease with stage 1 through stage 4 chronic kidney disease, or unspecified chronic kidney disease: Secondary | ICD-10-CM | POA: Diagnosis not present

## 2021-10-02 DIAGNOSIS — N184 Chronic kidney disease, stage 4 (severe): Secondary | ICD-10-CM | POA: Diagnosis not present

## 2021-10-02 DIAGNOSIS — E211 Secondary hyperparathyroidism, not elsewhere classified: Secondary | ICD-10-CM | POA: Diagnosis not present

## 2021-10-09 DIAGNOSIS — R809 Proteinuria, unspecified: Secondary | ICD-10-CM | POA: Diagnosis not present

## 2021-10-09 DIAGNOSIS — Z8679 Personal history of other diseases of the circulatory system: Secondary | ICD-10-CM | POA: Diagnosis not present

## 2021-10-09 DIAGNOSIS — D631 Anemia in chronic kidney disease: Secondary | ICD-10-CM | POA: Diagnosis not present

## 2021-10-09 DIAGNOSIS — E211 Secondary hyperparathyroidism, not elsewhere classified: Secondary | ICD-10-CM | POA: Diagnosis not present

## 2021-10-09 DIAGNOSIS — I9589 Other hypotension: Secondary | ICD-10-CM | POA: Diagnosis not present

## 2021-10-09 DIAGNOSIS — N184 Chronic kidney disease, stage 4 (severe): Secondary | ICD-10-CM | POA: Diagnosis not present

## 2021-10-09 DIAGNOSIS — N189 Chronic kidney disease, unspecified: Secondary | ICD-10-CM | POA: Diagnosis not present

## 2021-10-18 DIAGNOSIS — N185 Chronic kidney disease, stage 5: Secondary | ICD-10-CM | POA: Diagnosis not present

## 2021-10-27 ENCOUNTER — Other Ambulatory Visit: Payer: Self-pay | Admitting: Physician Assistant

## 2021-10-27 DIAGNOSIS — I10 Essential (primary) hypertension: Secondary | ICD-10-CM

## 2021-11-19 ENCOUNTER — Other Ambulatory Visit: Payer: Self-pay | Admitting: Physician Assistant

## 2021-11-19 ENCOUNTER — Other Ambulatory Visit: Payer: Self-pay | Admitting: Family Medicine

## 2021-11-19 DIAGNOSIS — I10 Essential (primary) hypertension: Secondary | ICD-10-CM

## 2021-11-19 DIAGNOSIS — R399 Unspecified symptoms and signs involving the genitourinary system: Secondary | ICD-10-CM

## 2021-11-20 ENCOUNTER — Other Ambulatory Visit: Payer: Self-pay | Admitting: Physician Assistant

## 2021-11-20 ENCOUNTER — Other Ambulatory Visit: Payer: Self-pay | Admitting: Family Medicine

## 2021-11-20 DIAGNOSIS — R399 Unspecified symptoms and signs involving the genitourinary system: Secondary | ICD-10-CM

## 2021-11-20 DIAGNOSIS — I10 Essential (primary) hypertension: Secondary | ICD-10-CM

## 2021-11-21 DIAGNOSIS — N189 Chronic kidney disease, unspecified: Secondary | ICD-10-CM | POA: Diagnosis not present

## 2021-11-21 DIAGNOSIS — D631 Anemia in chronic kidney disease: Secondary | ICD-10-CM | POA: Diagnosis not present

## 2021-11-21 DIAGNOSIS — N184 Chronic kidney disease, stage 4 (severe): Secondary | ICD-10-CM | POA: Diagnosis not present

## 2021-11-21 DIAGNOSIS — E211 Secondary hyperparathyroidism, not elsewhere classified: Secondary | ICD-10-CM | POA: Diagnosis not present

## 2021-11-21 DIAGNOSIS — R809 Proteinuria, unspecified: Secondary | ICD-10-CM | POA: Diagnosis not present

## 2021-11-27 ENCOUNTER — Other Ambulatory Visit: Payer: Self-pay | Admitting: Family Medicine

## 2021-11-27 ENCOUNTER — Other Ambulatory Visit: Payer: Self-pay | Admitting: Physician Assistant

## 2021-11-27 DIAGNOSIS — R399 Unspecified symptoms and signs involving the genitourinary system: Secondary | ICD-10-CM

## 2021-11-27 DIAGNOSIS — I10 Essential (primary) hypertension: Secondary | ICD-10-CM

## 2021-11-28 DIAGNOSIS — N189 Chronic kidney disease, unspecified: Secondary | ICD-10-CM | POA: Diagnosis not present

## 2021-11-28 DIAGNOSIS — N184 Chronic kidney disease, stage 4 (severe): Secondary | ICD-10-CM | POA: Diagnosis not present

## 2021-11-28 DIAGNOSIS — D631 Anemia in chronic kidney disease: Secondary | ICD-10-CM | POA: Diagnosis not present

## 2021-11-28 DIAGNOSIS — E211 Secondary hyperparathyroidism, not elsewhere classified: Secondary | ICD-10-CM | POA: Diagnosis not present

## 2021-11-28 DIAGNOSIS — R809 Proteinuria, unspecified: Secondary | ICD-10-CM | POA: Diagnosis not present

## 2021-11-28 DIAGNOSIS — I129 Hypertensive chronic kidney disease with stage 1 through stage 4 chronic kidney disease, or unspecified chronic kidney disease: Secondary | ICD-10-CM | POA: Diagnosis not present

## 2021-11-28 DIAGNOSIS — N170 Acute kidney failure with tubular necrosis: Secondary | ICD-10-CM | POA: Diagnosis not present

## 2021-12-23 ENCOUNTER — Other Ambulatory Visit: Payer: Self-pay | Admitting: Family Medicine

## 2021-12-23 DIAGNOSIS — I10 Essential (primary) hypertension: Secondary | ICD-10-CM

## 2021-12-23 DIAGNOSIS — R399 Unspecified symptoms and signs involving the genitourinary system: Secondary | ICD-10-CM

## 2022-01-09 DIAGNOSIS — R809 Proteinuria, unspecified: Secondary | ICD-10-CM | POA: Diagnosis not present

## 2022-01-09 DIAGNOSIS — N170 Acute kidney failure with tubular necrosis: Secondary | ICD-10-CM | POA: Diagnosis not present

## 2022-01-09 DIAGNOSIS — N189 Chronic kidney disease, unspecified: Secondary | ICD-10-CM | POA: Diagnosis not present

## 2022-01-09 DIAGNOSIS — I129 Hypertensive chronic kidney disease with stage 1 through stage 4 chronic kidney disease, or unspecified chronic kidney disease: Secondary | ICD-10-CM | POA: Diagnosis not present

## 2022-01-09 DIAGNOSIS — N184 Chronic kidney disease, stage 4 (severe): Secondary | ICD-10-CM | POA: Diagnosis not present

## 2022-01-09 DIAGNOSIS — E211 Secondary hyperparathyroidism, not elsewhere classified: Secondary | ICD-10-CM | POA: Diagnosis not present

## 2022-01-09 DIAGNOSIS — D631 Anemia in chronic kidney disease: Secondary | ICD-10-CM | POA: Diagnosis not present

## 2022-04-21 ENCOUNTER — Other Ambulatory Visit: Payer: Self-pay | Admitting: Physician Assistant

## 2022-04-21 DIAGNOSIS — I10 Essential (primary) hypertension: Secondary | ICD-10-CM

## 2022-07-11 DIAGNOSIS — N170 Acute kidney failure with tubular necrosis: Secondary | ICD-10-CM | POA: Diagnosis not present

## 2022-07-11 DIAGNOSIS — N184 Chronic kidney disease, stage 4 (severe): Secondary | ICD-10-CM | POA: Diagnosis not present

## 2022-07-11 DIAGNOSIS — R809 Proteinuria, unspecified: Secondary | ICD-10-CM | POA: Diagnosis not present

## 2022-07-11 DIAGNOSIS — N189 Chronic kidney disease, unspecified: Secondary | ICD-10-CM | POA: Diagnosis not present

## 2022-07-11 DIAGNOSIS — E211 Secondary hyperparathyroidism, not elsewhere classified: Secondary | ICD-10-CM | POA: Diagnosis not present

## 2022-07-11 DIAGNOSIS — I129 Hypertensive chronic kidney disease with stage 1 through stage 4 chronic kidney disease, or unspecified chronic kidney disease: Secondary | ICD-10-CM | POA: Diagnosis not present

## 2022-07-11 DIAGNOSIS — D631 Anemia in chronic kidney disease: Secondary | ICD-10-CM | POA: Diagnosis not present

## 2022-07-16 DIAGNOSIS — I951 Orthostatic hypotension: Secondary | ICD-10-CM | POA: Diagnosis not present

## 2022-07-16 DIAGNOSIS — N2581 Secondary hyperparathyroidism of renal origin: Secondary | ICD-10-CM | POA: Diagnosis not present

## 2022-07-16 DIAGNOSIS — N184 Chronic kidney disease, stage 4 (severe): Secondary | ICD-10-CM | POA: Diagnosis not present

## 2022-07-16 DIAGNOSIS — E785 Hyperlipidemia, unspecified: Secondary | ICD-10-CM | POA: Diagnosis not present

## 2022-07-16 DIAGNOSIS — I129 Hypertensive chronic kidney disease with stage 1 through stage 4 chronic kidney disease, or unspecified chronic kidney disease: Secondary | ICD-10-CM | POA: Diagnosis not present

## 2022-07-16 DIAGNOSIS — N529 Male erectile dysfunction, unspecified: Secondary | ICD-10-CM | POA: Diagnosis not present

## 2022-07-16 DIAGNOSIS — Z8673 Personal history of transient ischemic attack (TIA), and cerebral infarction without residual deficits: Secondary | ICD-10-CM | POA: Diagnosis not present

## 2022-07-18 ENCOUNTER — Other Ambulatory Visit (HOSPITAL_COMMUNITY): Payer: Self-pay | Admitting: Nephrology

## 2022-07-18 DIAGNOSIS — N185 Chronic kidney disease, stage 5: Secondary | ICD-10-CM | POA: Diagnosis not present

## 2022-07-18 DIAGNOSIS — N170 Acute kidney failure with tubular necrosis: Secondary | ICD-10-CM

## 2022-07-18 DIAGNOSIS — I120 Hypertensive chronic kidney disease with stage 5 chronic kidney disease or end stage renal disease: Secondary | ICD-10-CM

## 2022-07-18 DIAGNOSIS — I951 Orthostatic hypotension: Secondary | ICD-10-CM | POA: Diagnosis not present

## 2022-07-18 DIAGNOSIS — R809 Proteinuria, unspecified: Secondary | ICD-10-CM | POA: Diagnosis not present

## 2022-07-18 DIAGNOSIS — Z8679 Personal history of other diseases of the circulatory system: Secondary | ICD-10-CM | POA: Diagnosis not present

## 2022-07-18 DIAGNOSIS — D631 Anemia in chronic kidney disease: Secondary | ICD-10-CM

## 2022-07-18 DIAGNOSIS — E211 Secondary hyperparathyroidism, not elsewhere classified: Secondary | ICD-10-CM | POA: Diagnosis not present

## 2022-07-18 DIAGNOSIS — E559 Vitamin D deficiency, unspecified: Secondary | ICD-10-CM | POA: Diagnosis not present

## 2022-07-28 ENCOUNTER — Ambulatory Visit (INDEPENDENT_AMBULATORY_CARE_PROVIDER_SITE_OTHER): Payer: Medicare HMO | Admitting: Internal Medicine

## 2022-07-28 ENCOUNTER — Encounter: Payer: Self-pay | Admitting: Internal Medicine

## 2022-07-28 VITALS — BP 106/78 | HR 61 | Temp 97.7°F | Ht 76.0 in | Wt 206.6 lb

## 2022-07-28 DIAGNOSIS — I6930 Unspecified sequelae of cerebral infarction: Secondary | ICD-10-CM | POA: Diagnosis not present

## 2022-07-28 DIAGNOSIS — N529 Male erectile dysfunction, unspecified: Secondary | ICD-10-CM

## 2022-07-28 DIAGNOSIS — N184 Chronic kidney disease, stage 4 (severe): Secondary | ICD-10-CM

## 2022-07-28 DIAGNOSIS — E7849 Other hyperlipidemia: Secondary | ICD-10-CM | POA: Diagnosis not present

## 2022-07-28 DIAGNOSIS — H9190 Unspecified hearing loss, unspecified ear: Secondary | ICD-10-CM | POA: Diagnosis not present

## 2022-07-28 DIAGNOSIS — N401 Enlarged prostate with lower urinary tract symptoms: Secondary | ICD-10-CM

## 2022-07-28 DIAGNOSIS — R7303 Prediabetes: Secondary | ICD-10-CM

## 2022-07-28 DIAGNOSIS — I10 Essential (primary) hypertension: Secondary | ICD-10-CM | POA: Diagnosis not present

## 2022-07-28 DIAGNOSIS — E559 Vitamin D deficiency, unspecified: Secondary | ICD-10-CM

## 2022-07-28 DIAGNOSIS — N2581 Secondary hyperparathyroidism of renal origin: Secondary | ICD-10-CM

## 2022-07-28 DIAGNOSIS — H903 Sensorineural hearing loss, bilateral: Secondary | ICD-10-CM

## 2022-07-28 HISTORY — DX: Unspecified hearing loss, unspecified ear: H91.90

## 2022-07-28 MED ORDER — TADALAFIL 5 MG PO TABS: 5.0000 mg | ORAL_TABLET | Freq: Every day | ORAL | 11 refills | Status: DC

## 2022-07-28 NOTE — Assessment & Plan Note (Signed)
>>  ASSESSMENT AND PLAN FOR HYPERCALCEMIA WRITTEN ON 07/28/2022 11:09 AM BY Lula Olszewski, MD  Defer to nephrology for calcium management.

## 2022-07-28 NOTE — Progress Notes (Signed)
[place]  Phone: [phone]  New patient visit  Visit Date: 07/28/2022 Patient: Brent Jimenez   DOB: 22-Jun-1955   67 y.o. Male  MRN: [id number] PCP:  Loralee Pacas, MD  (establishing care today)  [place] Provider: Loralee Pacas, MD   Assessment and Plan:   Yeniel was seen today for new patient (initial visit), review medication adjustment, chronic kidney disease and weight loss.  Hearing loss, unspecified hearing loss type, unspecified laterality -     Ambulatory referral to Audiology -     Ambulatory referral to ENT  History of cerebrovascular accident (CVA) with residual deficit Overview: 2007 aneurysm rupture, was depressurized scar on left head Patient reports mild aphasia and dysphasia, comprehension difficulty since then, can't read since then, can't follow conversation beyond a few words.   Essential hypertension, benign Overview: BP Readings from Last 3 Encounters:  07/28/22 106/78  03/20/21 124/82  01/08/20 116/75    Current hypertension medications:       Sig   labetalol (NORMODYNE) 300 MG tablet (Taking) Take 1 tablet (300 mg total) by mouth 2 (two) times daily.   amLODipine (NORVASC) 10 MG tablet TAKE ONE TABLET BY MOUTH ONE TIME DAILY   doxazosin (CARDURA) 8 MG tablet TAKE ONE TABLET BY MOUTH ONE TIME DAILY   sildenafil (VIAGRA) 50 MG tablet Take 1 tablet (50 mg total) by mouth daily as needed for erectile dysfunction. At least 24 hours between doses      Managed by nephrology  Assessment & Plan: Well-controlled, defer to nephrology management    Other hyperlipidemia Overview: Lipid Panel     Component Value Date/Time   CHOL 138 06/09/2018 1132   TRIG 71 06/09/2018 1132   HDL 40 06/09/2018 1132   CHOLHDL 3.5 06/09/2018 1132   CHOLHDL 3.7 11/07/2015 1119   VLDL 20 11/07/2015 1119   LDLCALC 84 06/09/2018 1132   LDLDIRECT 97 10/23/2009 2324   LABVLDL 14 06/09/2018 1132   The ASCVD Risk score (Arnett  DK, et al., 2019) failed to calculate for the following reasons:   The patient has a prior MI or stroke diagnosis  Takes pravastatin long term    Benign prostatic hyperplasia with lower urinary tract symptoms, symptom details unspecified Overview: Nephrology discontinued doxazosin due to lack of symptom(s)  Lab Results  Component Value Date   PSA 0.65 09/14/2014   PSA 0.65 06/15/2012   PSA 0.36 06/17/2007         Prediabetes Overview: Lab Results  Component Value Date   HGBA1C 5.7 03/20/2021   HGBA1C 5.8 06/09/2018   HGBA1C 6.0 05/13/2017   Managed with diet and exercise only   Assessment & Plan: Advised patient to avoid sweets Managed with diet and exercise only   Orders: -     Lipid panel -     Hemoglobin A1c  Vitamin D deficiency Overview: Managed by nephrology Takes 1000 vitamin D as directed   Erectile dysfunction, unspecified erectile dysfunction type Overview: Pde5 made his head hurt Saw urologist- who tried Cialis.   Orders: -     Tadalafil; Take 1 tablet (5 mg total) by mouth daily.  Dispense: 10 tablet; Refill: 11  Sensorineural hearing loss (SNHL) of both ears Overview: Unconfirmed reported by wife   Chronic kidney disease, stage 4 (severe) (HCC) Overview: No results found for: "GFR" CrCl cannot be calculated (Patient's most recent lab result is older than the maximum 21 days allowed.). Lab Results  Component Value Date/Time  CREATININE 4.67 (H) 01/06/2020 07:22 PM   CREATININE 3.54 (H) 06/09/2018 11:32 AM   CREATININE 3.49 (H) 05/13/2017 02:40 PM   CREATININE 3.44 (H) 11/07/2015 11:19 AM   CREATININE 3.24 (H) 09/14/2014 12:01 PM   CREATININE 3.27 (H) 12/22/2013 10:07 AM   CREATININE 3.30 (H) 06/15/2012 01:02 PM   CREATININE 4.42 (H) 04/14/2012 05:54 PM  Following closely with nephro   Assessment & Plan: Advised patient to close follow up with nephrology and get fistula as planned- they will go to  class   Hypercalcemia Overview: Lab Results  Component Value Date   CALCIUM 9.1 01/06/2020   PHOS 3.8 12/18/2008   Newer labs from nephrology who is managing  Assessment & Plan: Defer to nephrology for calcium management.   Secondary hyperparathyroidism ([place])    Subjective:  Patient presents today to establish care.  Chief Complaint  Patient presents with   New Patient (Initial Visit)   Review medication adjustment    Nephrologist recently decreased how often the [place] is taken. Wants to be sure this is appropriate. Was adjusted due too his BP being low, was lower standing up (during Wellness nurse visit)   Chronic Kidney Disease    Sees Nephrology, is having U/S of kidneys tomorrow.   Weight Loss    Has lost a considerable amount of weight.    Problem-oriented charting was used to develop and update his medical history: Problem  Hearing Loss  Chronic Kidney Disease, Stage 4 (Severe) (Hcc)   No results found for: "GFR" CrCl cannot be calculated (Patient's most recent lab result is older than the maximum 21 days allowed.). Lab Results  Component Value Date/Time   CREATININE 4.67 (H) 01/06/2020 07:22 PM   CREATININE 3.54 (H) 06/09/2018 11:32 AM   CREATININE 3.49 (H) 05/13/2017 02:40 PM   CREATININE 3.44 (H) 11/07/2015 11:19 AM   CREATININE 3.24 (H) 09/14/2014 12:01 PM   CREATININE 3.27 (H) 12/22/2013 10:07 AM   CREATININE 3.30 (H) 06/15/2012 01:02 PM   CREATININE 4.42 (H) 04/14/2012 05:54 PM  Following closely with nephro    Hypercalcemia   Lab Results  Component Value Date   CALCIUM 9.1 01/06/2020   PHOS 3.8 12/18/2008   Newer labs from nephrology who is managing   History of Cerebrovascular Accident (Cva) With Residual Deficit   2007 aneurysm rupture, was depressurized scar on left head Patient reports mild aphasia and dysphasia, comprehension difficulty since then, can't read since then, can't follow conversation beyond a few words.   Sensorineural  Hearing Loss (Snhl) of Both Ears   Unconfirmed reported by wife   Hyperlipidemia   Lipid Panel     Component Value Date/Time   CHOL 138 06/09/2018 1132   TRIG 71 06/09/2018 1132   HDL 40 06/09/2018 1132   CHOLHDL 3.5 06/09/2018 1132   CHOLHDL 3.7 11/07/2015 1119   VLDL 20 11/07/2015 1119   LDLCALC 84 06/09/2018 1132   LDLDIRECT 97 10/23/2009 2324   LABVLDL 14 06/09/2018 1132  The ASCVD Risk score (Arnett DK, et al., 2019) failed to calculate for the following reasons:   The patient has a prior MI or stroke diagnosis  Takes pravastatin long term    Essential Hypertension, Benign   BP Readings from Last 3 Encounters:  07/28/22 106/78  03/20/21 124/82  01/08/20 116/75    Current hypertension medications:       Sig   labetalol (NORMODYNE) 300 MG tablet (Taking) Take 1 tablet (300 mg total) by mouth 2 (two) times daily.  amLODipine (NORVASC) 10 MG tablet TAKE ONE TABLET BY MOUTH ONE TIME DAILY   doxazosin (CARDURA) 8 MG tablet TAKE ONE TABLET BY MOUTH ONE TIME DAILY   sildenafil (VIAGRA) 50 MG tablet Take 1 tablet (50 mg total) by mouth daily as needed for erectile dysfunction. At least 24 hours between doses      Managed by nephrology   Bph (Benign Prostatic Hyperplasia)   Nephrology discontinued doxazosin due to lack of symptom(s)  Lab Results  Component Value Date   PSA 0.65 09/14/2014   PSA 0.65 06/15/2012   PSA 0.36 06/17/2007        Prediabetes   Lab Results  Component Value Date   HGBA1C 5.7 03/20/2021   HGBA1C 5.8 06/09/2018   HGBA1C 6.0 05/13/2017   Managed with diet and exercise only    Vitamin D Deficiency   Managed by nephrology Takes 1000 vitamin D as directed   Erectile Dysfunction   Pde5 made his head hurt Saw urologist- who tried Cialis.    Bilateral Impacted Cerumen (Resolved)  Other and Unspecified Hyperlipidemia (Resolved)     Depression Screen    07/28/2022   10:26 AM 03/20/2021    3:15 PM 12/07/2018    9:31 AM 06/09/2018    10:30 AM  PHQ 2/9 Scores  PHQ - 2 Score 0 0 0   PHQ- 9 Score   0   Exception Documentation    Patient refusal   No results found for any visits on 07/28/22.  The following were reviewed and entered/updated into his MEDICAL RECORD [place] History:  Diagnosis Date   Bilateral impacted cerumen 03/25/2021   Brain bleed ([place]) 05/12/2005   expressive asphasia   Chronic kidney disease    History of CVA (cerebrovascular accident) 02/03/2019   Hypercholesteremia    Hypertension    Stroke [facility])    Past Surgical History:  Procedure Laterality Date   BRAIN SURGERY  2007   Family History  Problem Relation Age of Onset   Heart disease Mother    Stroke Father 35       died of stroke   Hypertension Father    Colon cancer Neg Hx    Colon polyps Neg Hx    Esophageal cancer Neg Hx    Stomach cancer Neg Hx    Rectal cancer Neg Hx    Outpatient Medications Prior to Visit  Medication Sig Dispense Refill   Cholecalciferol 25 MCG (1000 UT) capsule Take by mouth.     cinacalcet (SENSIPAR) 30 MG tablet Take by mouth. Takes on Mondays and Fridays.     labetalol (NORMODYNE) 300 MG tablet Take 1 tablet (300 mg total) by mouth 2 (two) times daily. 180 tablet 1   amLODipine (NORVASC) 10 MG tablet TAKE ONE TABLET BY MOUTH ONE TIME DAILY 30 tablet 0   calcitRIOL (ROCALTROL) 0.5 MCG capsule Take 1 capsule (0.5 mcg total) by mouth daily. 90 capsule 1   doxazosin (CARDURA) 8 MG tablet TAKE ONE TABLET BY MOUTH ONE TIME DAILY 30 tablet 0   pravastatin (PRAVACHOL) 40 MG tablet TAKE 1 TABLET(40 MG) BY MOUTH DAILY 90 tablet 0   sildenafil (VIAGRA) 50 MG tablet Take 1 tablet (50 mg total) by mouth daily as needed for erectile dysfunction. At least 24 hours between doses 10 tablet 1   finasteride (PROSCAR) 5 MG tablet Take 1 tablet (5 mg total) by mouth daily. 90 tablet 1   Facility-Administered Medications Prior to Visit  Medication Dose Route Frequency Provider  Last Rate Last Admin   0.9 %  sodium  chloride infusion  500 mL Intravenous Once Armbruster, Carlota Raspberry, MD        No Known Allergies Social History   Tobacco Use   Smoking status: Never   Smokeless tobacco: Never  Vaping Use   Vaping Use: Never used  Substance Use Topics   Alcohol use: No   Drug use: No    Immunization History  Administered Date(s) Administered   Influenza,inj,Quad PF,6+ Mos 05/13/2017, 06/09/2018   Influenza-Unspecified 06/10/2022   Tdap 06/09/2018    Objective:  Physical Exam  BP 106/78 (BP Location: Left Arm, Patient Position: Sitting)   Pulse 61   Temp 97.7 F (36.5 C) (Temporal)   Ht 6\' 4"  (1.93 m)   Wt 206 lb 9.6 oz (93.7 kg)   SpO2 98%   BMI 25.15 kg/m  Wt Readings from Last 10 Encounters:  07/28/22 206 lb 9.6 oz (93.7 kg)  03/20/21 229 lb 12.8 oz (104.2 kg)  01/06/20 230 lb (104.3 kg)  02/14/19 237 lb 12.8 oz (107.9 kg)  01/31/19 237 lb 12.8 oz (107.9 kg)  06/09/18 224 lb (101.6 kg)  11/09/17 224 lb 3.2 oz (101.7 kg)  05/13/17 230 lb (104.3 kg)  06/16/16 225 lb (102.1 kg)  11/07/15 228 lb 9.6 oz (103.7 kg)   Vital signs reviewed. Nursing notes reviewed.  Weight(s) reviewed General Appearance/Constitutional:  polite male in no acute distress Musculoskeletal: All extremities are intact.  Neurological:  Awake, alert,  No obvious focal neurological deficits or cognitive impairments Psychiatric:  Appropriate mood, pleasant demeanor Problem-specific findings:  he has difficulty comprehending long sentences and is hard of hearing, very mild truncal adiposity   Results Reviewed (reviewed labs/imaging may be also be found in the assessment / plan section):    Old labs showing prediabetes.  Outside labs reviewed showing gfr running around 15

## 2022-07-28 NOTE — Patient Instructions (Addendum)
It was a pleasure seeing you today!  Your health and satisfaction are my top priorities. If you believe your experience today was worthy of a 5-star rating, I'd be grateful for your feedback!  Please check the blood pressure regularly and let nephrology know if its running under 100/60 Loralee Pacas, MD   CHECKOUT CHECKLIST  []    Schedule next appointment(s):    Return in about 6 months (around 01/28/2023) for chronic disease monitoring and management.  Any requested lab visits should be scheduled as appointments too  If you are not doing well:  Return to the office sooner Please bring all your medicine bottles to each appointment If your condition begins to worsen or become severe:  go to the emergency room or even call 911  []    Sign release of information authorizations: Any records we need for your care and to be your medical home   []   (Optional):  Review your clinical notes on MyChart after they are completed.     Today's draft of the physician documented plan for today's visit: (final revisions will be visible on MyChart chart later) Hearing loss, unspecified hearing loss type, unspecified laterality  History of cerebrovascular accident (CVA) with residual deficit  Essential hypertension, benign Assessment & Plan: Well-controlled, defer to nephrology management    Other hyperlipidemia  Benign prostatic hyperplasia with lower urinary tract symptoms, symptom details unspecified  Prediabetes Assessment & Plan: Advised patient to avoid sweets Managed with diet and exercise only    Vitamin D deficiency  Erectile dysfunction, unspecified erectile dysfunction type  Sensorineural hearing loss (SNHL) of both ears  Chronic kidney disease, stage 4 (severe) (HCC) Assessment & Plan: Advised patient to close follow up with nephrology and get fistula as planned- they will go to class   Hypercalcemia      QUESTIONS & CONCERNS: CLINICAL: please contact us via phone  [phone] OR MyChart messaging  LAB & IMAGING:   We will call you if the results are significantly abnormal or you don't use MyChart.  Most normal results will be posted to MyChart immediately and have a clinical review message by Dr. Randol Kern posted within 2-3 business days.   If you have not heard from [place] regarding the results in 2 weeks OR if you need priority reporting, please contact this office. MYCHART:  The fastest way to get your results and easiest way to stay in touch with [place] is by activating your My Chart account. Instructions are located on the last page of this paperwork.  BILLING: xray and lab orders are billed from separate companies and questions./concerns should be directed to the [facility].  For visit charges please discuss with our administrative services COMPLAINTS:  please let Dr. Randol Kern know or see the [place], by asking at the front desk: we want you to be satisfied with every experience and we would be grateful for the opportunity to address any problems

## 2022-07-28 NOTE — Assessment & Plan Note (Signed)
Advised patient to avoid sweets Managed with diet and exercise only

## 2022-07-28 NOTE — Assessment & Plan Note (Signed)
Advised patient to close follow up with nephrology and get fistula as planned- they will go to class

## 2022-07-28 NOTE — Assessment & Plan Note (Signed)
Defer to nephrology for calcium management.

## 2022-07-28 NOTE — Assessment & Plan Note (Signed)
Well-controlled, defer to nephrology management

## 2022-07-29 ENCOUNTER — Ambulatory Visit (HOSPITAL_COMMUNITY)
Admission: RE | Admit: 2022-07-29 | Discharge: 2022-07-29 | Disposition: A | Discharge status: Home / Self Care | Payer: Medicare HMO | Source: Ambulatory Visit | Attending: Nephrology | Admitting: Nephrology

## 2022-07-29 DIAGNOSIS — I120 Hypertensive chronic kidney disease with stage 5 chronic kidney disease or end stage renal disease: Secondary | ICD-10-CM | POA: Diagnosis not present

## 2022-07-29 DIAGNOSIS — N185 Chronic kidney disease, stage 5: Secondary | ICD-10-CM | POA: Insufficient documentation

## 2022-07-29 DIAGNOSIS — N170 Acute kidney failure with tubular necrosis: Secondary | ICD-10-CM | POA: Diagnosis not present

## 2022-07-29 DIAGNOSIS — D631 Anemia in chronic kidney disease: Secondary | ICD-10-CM | POA: Diagnosis not present

## 2022-07-29 DIAGNOSIS — R809 Proteinuria, unspecified: Secondary | ICD-10-CM | POA: Diagnosis not present

## 2022-07-29 DIAGNOSIS — I951 Orthostatic hypotension: Secondary | ICD-10-CM | POA: Insufficient documentation

## 2022-07-29 DIAGNOSIS — N179 Acute kidney failure, unspecified: Secondary | ICD-10-CM | POA: Diagnosis not present

## 2022-07-29 DIAGNOSIS — N281 Cyst of kidney, acquired: Secondary | ICD-10-CM | POA: Diagnosis not present

## 2022-08-06 ENCOUNTER — Encounter: Payer: Medicare HMO | Admitting: Vascular Surgery

## 2022-08-12 ENCOUNTER — Ambulatory Visit: Payer: Medicare HMO | Attending: Audiologist | Admitting: Audiologist

## 2022-08-12 ENCOUNTER — Encounter: Payer: Self-pay | Admitting: Vascular Surgery

## 2022-08-12 DIAGNOSIS — N170 Acute kidney failure with tubular necrosis: Secondary | ICD-10-CM

## 2022-08-12 DIAGNOSIS — R809 Proteinuria, unspecified: Secondary | ICD-10-CM | POA: Insufficient documentation

## 2022-08-12 DIAGNOSIS — H903 Sensorineural hearing loss, bilateral: Secondary | ICD-10-CM | POA: Diagnosis not present

## 2022-08-12 DIAGNOSIS — E211 Secondary hyperparathyroidism, not elsewhere classified: Secondary | ICD-10-CM

## 2022-08-12 DIAGNOSIS — I951 Orthostatic hypotension: Secondary | ICD-10-CM | POA: Insufficient documentation

## 2022-08-12 DIAGNOSIS — I6930 Unspecified sequelae of cerebral infarction: Secondary | ICD-10-CM | POA: Insufficient documentation

## 2022-08-12 DIAGNOSIS — D631 Anemia in chronic kidney disease: Secondary | ICD-10-CM

## 2022-08-12 DIAGNOSIS — I120 Hypertensive chronic kidney disease with stage 5 chronic kidney disease or end stage renal disease: Secondary | ICD-10-CM | POA: Insufficient documentation

## 2022-08-12 HISTORY — DX: Hypertensive chronic kidney disease with stage 5 chronic kidney disease or end stage renal disease: I12.0

## 2022-08-12 HISTORY — DX: Anemia in chronic kidney disease: D63.1

## 2022-08-12 HISTORY — DX: Proteinuria, unspecified: R80.9

## 2022-08-12 HISTORY — DX: Secondary hyperparathyroidism, not elsewhere classified: E21.1

## 2022-08-12 HISTORY — DX: Orthostatic hypotension: I95.1

## 2022-08-12 HISTORY — DX: Acute kidney failure with tubular necrosis: N17.0

## 2022-08-12 NOTE — Procedures (Signed)
  Outpatient Audiology and [place], [place]  [id number] [phone]  AUDIOLOGICAL  EVALUATION  NAME: Brent Jimenez     DOB:   1956-03-31      MRN: [id number]                                                                                     DATE: 08/12/2022     REFERENT: Loralee Pacas, MD STATUS: Outpatient DIAGNOSIS: Aphasia, Mild Sensorineural Hearing Loss bilaterally    History: Raylynn was seen for an audiological evaluation due to difficulty understanding people. In 2007 Raunak had a stroke. He lost most of his day to day functioning. He has worked hard to relearn basic tasks. He still struggles to understand people who talk fast, mumble, or change the topic. Fortunato has no pain or pressure in either ear. He denied any tinnitus. Susan medical history shows chronic kidney disease as well. Coltin's ability to report history is fair, wife had to help him supplement and correct some of his responses.    Evaluation:  Otoscopy showed a clear view of the tympanic membrane in the right ear and cerumen build up in the left ear blocking view  Tympanometry results were consistent with normal middle ear function, bilaterally   Audiometric testing was completed using Conventional Audiometry techniques with insert earphones and TDH headphones. Test results are consistent with mild high frequency hearing loss bilaterally. Due to aphasia picture pointing used during speech testing. Speech Recognition Thresholds were obtained at 20dB HL in the right ear and at 15dB HL in the left ear. Word Recognition Testing was completed at [place] scored 100% in each ear using picture pointing.    Results:  The test results were reviewed with Dorothyann Peng and his wife. Leamon has a mild high frequency hearing loss.   Recommendations: He needs speech slowed down to help understanding with his aphasia.  Recommend he try OTC hearing aids. Given list of  devices. He can also benefit from a [employer]. Referral placed.  Use Debrox to remove wax from left ear.    42 minutes spent testing and counseling on results.   If you have any questions please feel free to contact me at [phone].  Alfonse Alpers Audiologist, Au.D., CCC-A 08/12/2022  9:48 AM  Cc: Loralee Pacas, MD

## 2022-08-13 ENCOUNTER — Encounter: Payer: Medicare HMO | Admitting: Vascular Surgery

## 2022-08-20 ENCOUNTER — Telehealth: Payer: Self-pay | Admitting: Internal Medicine

## 2022-08-20 NOTE — Telephone Encounter (Signed)
Contacted Brent Jimenez to schedule their annual wellness visit. Appointment made for 09/02/2022.  Gabriel Cirri [facility] AWV TEAM Direct Dial [phone]

## 2022-09-02 ENCOUNTER — Ambulatory Visit (INDEPENDENT_AMBULATORY_CARE_PROVIDER_SITE_OTHER): Payer: Medicare HMO

## 2022-09-02 VITALS — BP 120/90 | HR 62 | Temp 98.0°F | Wt 202.6 lb

## 2022-09-02 DIAGNOSIS — Z0000 Encounter for general adult medical examination without abnormal findings: Secondary | ICD-10-CM

## 2022-09-02 NOTE — Patient Instructions (Signed)
Brent Jimenez , Thank you for taking time to come for your Medicare Wellness Visit. I appreciate your ongoing commitment to your health goals. Please review the following plan we discussed and let me know if I can assist you in the future.   These are the goals we discussed:  Goals   None     This is a list of the screening recommended for you and due dates:  Health Maintenance  Topic Date Due   Flu Shot  12/11/2022   Medicare Annual Wellness Visit  09/02/2023   Colon Cancer Screening  02/13/2026   DTaP/Tdap/Td vaccine (2 - Td or Tdap) 06/09/2028   Hepatitis C Screening: USPSTF Recommendation to screen - Ages 109-79 yo.  Completed   HPV Vaccine  Aged Out   Pneumonia Vaccine  Discontinued   COVID-19 Vaccine  Discontinued   Zoster (Shingles) Vaccine  Discontinued    Advanced directives: copies in chart   Conditions/risks identified: none at this time   Next appointment: Follow up in one year for your annual wellness visit.   Preventive Care 11 Years and Older, Male  Preventive care refers to lifestyle choices and visits with your health care provider that can promote health and wellness. What does preventive care include? A yearly physical exam. This is also called an annual well check. Dental exams once or twice a year. Routine eye exams. Ask your health care provider how often you should have your eyes checked. Personal lifestyle choices, including: Daily care of your teeth and gums. Regular physical activity. Eating a healthy diet. Avoiding tobacco and drug use. Limiting alcohol use. Practicing safe sex. Taking low doses of aspirin every day. Taking vitamin and mineral supplements as recommended by your health care provider. What happens during an annual well check? The services and screenings done by your health care provider during your annual well check will depend on your age, overall health, lifestyle risk factors, and family history of disease. Counseling  Your  health care provider may ask you questions about your: Alcohol use. Tobacco use. Drug use. Emotional well-being. Home and relationship well-being. Sexual activity. Eating habits. History of falls. Memory and ability to understand (cognition). Work and work Astronomer. Screening  You may have the following tests or measurements: Height, weight, and BMI. Blood pressure. Lipid and cholesterol levels. These may be checked every 5 years, or more frequently if you are over 36 years old. Skin check. Lung cancer screening. You may have this screening every year starting at age 43 if you have a 30-pack-year history of smoking and currently smoke or have quit within the past 15 years. Fecal occult blood test (FOBT) of the stool. You may have this test every year starting at age 54. Flexible sigmoidoscopy or colonoscopy. You may have a sigmoidoscopy every 5 years or a colonoscopy every 10 years starting at age 11. Prostate cancer screening. Recommendations will vary depending on your family history and other risks. Hepatitis C blood test. Hepatitis B blood test. Sexually transmitted disease (STD) testing. Diabetes screening. This is done by checking your blood sugar (glucose) after you have not eaten for a while (fasting). You may have this done every 1-3 years. Abdominal aortic aneurysm (AAA) screening. You may need this if you are a current or former smoker. Osteoporosis. You may be screened starting at age 12 if you are at high risk. Talk with your health care provider about your test results, treatment options, and if necessary, the need for more tests. Vaccines  Your health care provider may recommend certain vaccines, such as: Influenza vaccine. This is recommended every year. Tetanus, diphtheria, and acellular pertussis (Tdap, Td) vaccine. You may need a Td booster every 10 years. Zoster vaccine. You may need this after age 66. Pneumococcal 13-valent conjugate (PCV13) vaccine. One dose  is recommended after age 58. Pneumococcal polysaccharide (PPSV23) vaccine. One dose is recommended after age 41. Talk to your health care provider about which screenings and vaccines you need and how often you need them. This information is not intended to replace advice given to you by your health care provider. Make sure you discuss any questions you have with your health care provider. Document Released: 05/25/2015 Document Revised: 01/16/2016 Document Reviewed: 02/27/2015 Elsevier Interactive Patient Education  2017 [place] Prevention in the Home Falls can cause injuries. They can happen to people of all ages. There are many things you can do to make your home safe and to help prevent falls. What can I do on the outside of my home? Regularly fix the edges of walkways and driveways and fix any cracks. Remove anything that might make you trip as you walk through a door, such as a raised step or threshold. Trim any bushes or trees on the path to your home. Use bright outdoor lighting. Clear any walking paths of anything that might make someone trip, such as rocks or tools. Regularly check to see if handrails are loose or broken. Make sure that both sides of any steps have handrails. Any raised decks and porches should have guardrails on the edges. Have any leaves, snow, or ice cleared regularly. Use sand or salt on walking paths during winter. Clean up any spills in your garage right away. This includes oil or grease spills. What can I do in the bathroom? Use night lights. Install grab bars by the toilet and in the tub and shower. Do not use towel bars as grab bars. Use non-skid mats or decals in the tub or shower. If you need to sit down in the shower, use a plastic, non-slip stool. Keep the floor dry. Clean up any water that spills on the floor as soon as it happens. Remove soap buildup in the tub or shower regularly. Attach bath mats securely with double-sided non-slip rug  tape. Do not have throw rugs and other things on the floor that can make you trip. What can I do in the bedroom? Use night lights. Make sure that you have a light by your bed that is easy to reach. Do not use any sheets or blankets that are too big for your bed. They should not hang down onto the floor. Have a firm chair that has side arms. You can use this for support while you get dressed. Do not have throw rugs and other things on the floor that can make you trip. What can I do in the kitchen? Clean up any spills right away. Avoid walking on wet floors. Keep items that you use a lot in easy-to-reach places. If you need to reach something above you, use a strong step stool that has a grab bar. Keep electrical cords out of the way. Do not use floor polish or wax that makes floors slippery. If you must use wax, use non-skid floor wax. Do not have throw rugs and other things on the floor that can make you trip. What can I do with my stairs? Do not leave any items on the stairs. Make sure that there are  handrails on both sides of the stairs and use them. Fix handrails that are broken or loose. Make sure that handrails are as long as the stairways. Check any carpeting to make sure that it is firmly attached to the stairs. Fix any carpet that is loose or worn. Avoid having throw rugs at the top or bottom of the stairs. If you do have throw rugs, attach them to the floor with carpet tape. Make sure that you have a light switch at the top of the stairs and the bottom of the stairs. If you do not have them, ask someone to add them for you. What else can I do to help prevent falls? Wear shoes that: Do not have high heels. Have rubber bottoms. Are comfortable and fit you well. Are closed at the toe. Do not wear sandals. If you use a stepladder: Make sure that it is fully opened. Do not climb a closed stepladder. Make sure that both sides of the stepladder are locked into place. Ask someone to  hold it for you, if possible. Clearly mark and make sure that you can see: Any grab bars or handrails. First and last steps. Where the edge of each step is. Use tools that help you move around (mobility aids) if they are needed. These include: Canes. Walkers. Scooters. Crutches. Turn on the lights when you go into a dark area. Replace any light bulbs as soon as they burn out. Set up your furniture so you have a clear path. Avoid moving your furniture around. If any of your floors are uneven, fix them. If there are any pets around you, be aware of where they are. Review your medicines with your doctor. Some medicines can make you feel dizzy. This can increase your chance of falling. Ask your doctor what other things that you can do to help prevent falls. This information is not intended to replace advice given to you by your health care provider. Make sure you discuss any questions you have with your health care provider. Document Released: 02/22/2009 Document Revised: 10/04/2015 Document Reviewed: 06/02/2014 Elsevier Interactive Patient Education  2017 [employer].

## 2022-09-02 NOTE — Progress Notes (Signed)
Subjective:   Brent Jimenez is a 67 y.o. male who presents for an Initial Medicare Annual Wellness Visit. Along with Wife [name]      Patient Medicare AWV questionnaire was completed by the patient on 09/02/22; I have confirmed that all information answered by patient is correct and no changes since this date.     Review of Systems     Cardiac Risk Factors include: advanced age (>61men, >8 women)     Objective:    Today's Vitals   09/02/22 1100  BP: (!) 120/90  Pulse: 62  Temp: 98 F (36.7 C)  SpO2: 99%  Weight: 202 lb 9.6 oz (91.9 kg)   Body mass index is 24.66 kg/m.     09/02/2022   11:12 AM 11/07/2015   10:41 AM 02/15/2014    2:04 PM 12/28/2013    4:34 PM  Advanced Directives  Does Patient Have a Medical Advance Directive? Yes No No No  Type of Estate agent of [place];Living will     Does patient want to make changes to medical advance directive? No - Patient declined     Copy of Healthcare Power of Attorney in Chart? Yes - validated most recent copy scanned in chart (See row information)     Would patient like information on creating a medical advance directive?  Yes - Educational materials given      Current Medications (verified) Outpatient Encounter Medications as of 09/02/2022  Medication Sig   amLODipine (NORVASC) 10 MG tablet TAKE ONE TABLET BY MOUTH ONE TIME DAILY   Cholecalciferol 25 MCG (1000 UT) capsule Take by mouth.   labetalol (NORMODYNE) 300 MG tablet Take 1 tablet (300 mg total) by mouth 2 (two) times daily.   pravastatin (PRAVACHOL) 40 MG tablet TAKE 1 TABLET(40 MG) BY MOUTH DAILY   sildenafil (VIAGRA) 50 MG tablet Take 1 tablet (50 mg total) by mouth daily as needed for erectile dysfunction. At least 24 hours between doses   calcitRIOL (ROCALTROL) 0.5 MCG capsule Take 1 capsule (0.5 mcg total) by mouth daily. (Patient not taking: Reported on 09/02/2022)   cinacalcet (SENSIPAR) 30 MG tablet Take by mouth. Takes on Mondays  and Fridays. (Patient not taking: Reported on 09/02/2022)   tadalafil (CIALIS) 5 MG tablet Take 1 tablet (5 mg total) by mouth daily. (Patient not taking: Reported on 09/02/2022)   Facility-Administered Encounter Medications as of 09/02/2022  Medication   0.9 %  sodium chloride infusion    Allergies (verified) Patient has no known allergies.   History: Past Medical History:  Diagnosis Date   Acute kidney failure with lesion of tubular necrosis 08/12/2022   Anemia of chronic renal failure 08/12/2022   Bilateral impacted cerumen 03/25/2021   BPH (benign prostatic hyperplasia) 11/30/2013   Nephrology discontinued doxazosin due to lack of symptom(s)         Lab Results  Component  Value  Date     PSA  0.65  09/14/2014     PSA  0.65  06/15/2012     PSA  0.36  06/17/2007                  Brain bleed 05/12/2005   expressive asphasia   Chronic kidney disease    Chronic kidney disease, stage 4 (severe) 02/03/2019   No results found for: "GFR"  CrCl cannot be calculated (Patient's most recent lab result is older than the maximum 21 days allowed.).      Lab Results  Component  Value  Date/Time     CREATININE  4.67 (H)  01/06/2020 07:22 PM     CREATININE  3.54 (H)  06/09/2018 11:32 AM     CREATININE  3.49 (H)  05/13/2017 02:40 PM     CREATININE  3.44 (H)  11/07/2015 11:19 AM     CREATININE  3.24 (H)  09/14/2014   Erectile dysfunction 11/30/2013   Pde5 made his head hurt  Saw urologist- who tried Cialis but he never tried  I discussed that I think it might work for him, wife asked I call in, I sent in low dose to minimize headache risk... But advised patient close blood pressure monitoring due to kidneys      Hearing loss 07/28/2022   History of cerebrovascular accident (CVA) with residual deficit 02/03/2019   2007 aneurysm rupture, was depressurized scar on left head  Patient reports mild aphasia and dysphasia, comprehension difficulty since then, can't read since then, can't follow conversation beyond  a few words.   History of CVA (cerebrovascular accident) 02/03/2019   Hypercalcemia 02/03/2019   Lab Results  Component  Value  Date     CALCIUM  9.1  01/06/2020     PHOS  3.8  12/18/2008     Newer labs from nephrology who is managing   Hypercholesteremia    Hyperlipidemia 11/07/2015   Lipid Panel          Component  Value  Date/Time     CHOL  138  06/09/2018 1132     TRIG  71  06/09/2018 1132     HDL  40  06/09/2018 1132     CHOLHDL  3.5  06/09/2018 1132     CHOLHDL  3.7  11/07/2015 1119     VLDL  20  11/07/2015 1119     LDLCALC  84  06/09/2018 1132     LDLDIRECT  97  10/23/2009 2324     LABVLDL  14  06/09/2018 1132     The ASCVD Risk score (Arnett DK, et al., 2019) failed to c   Hypertension    Orthostatic hypotension 08/12/2022   Prediabetes 11/30/2013   Lab Results  Component  Value  Date     HGBA1C  5.7  03/20/2021     HGBA1C  5.8  06/09/2018     HGBA1C  6.0  05/13/2017   Managed with diet and exercise only      Proteinuria 08/12/2022   Secondary hyperparathyroidism, not elsewhere classified 08/12/2022   Sensorineural hearing loss (SNHL) of both ears 11/05/2017   Unconfirmed reported by wife   Stage 5 chronic kidney disease due to benign hypertension 08/12/2022   Stroke    Vitamin D deficiency 11/30/2013   Managed by nephrology  Takes 1000 vitamin D as directed   Past Surgical History:  Procedure Laterality Date   BRAIN SURGERY  2007   Family History  Problem Relation Age of Onset   Heart disease Mother    Stroke Father 60       died of stroke   Hypertension Father    Colon cancer Neg Hx    Colon polyps Neg Hx    Esophageal cancer Neg Hx    Stomach cancer Neg Hx    Rectal cancer Neg Hx    Social History   Socioeconomic History   Marital status: Married    Spouse name: Not on file   Number of children: Not on file   Years of education: Not on file  Highest education level: Not on file  Occupational History   Not on file  Tobacco Use   Smoking status: Never    Smokeless tobacco: Never  Vaping Use   Vaping Use: Never used  Substance and Sexual Activity   Alcohol use: No   Drug use: No   Sexual activity: Not on file  Other Topics Concern   Not on file  Social History Narrative   Not on file   Social Determinants of Health   Financial Resource Strain: Low Risk  (09/02/2022)   Overall Financial Resource Strain (CARDIA)    Difficulty of Paying Living Expenses: Not hard at all  Food Insecurity: No Food Insecurity (09/02/2022)   Hunger Vital Sign    Worried About Running Out of Food in the Last Year: Never true    Ran Out of Food in the Last Year: Never true  Transportation Needs: No Transportation Needs (09/02/2022)   PRAPARE - Administrator, Civil Service (Medical): No    Lack of Transportation (Non-Medical): No  Physical Activity: Unknown (09/02/2022)   Exercise Vital Sign    Days of Exercise per Week: Patient declined    Minutes of Exercise per Session: 40 min  Stress: No Stress Concern Present (09/02/2022)   [employer] of Occupational Health - Occupational Stress Questionnaire    Feeling of Stress : Not at all  Social Connections: Moderately Integrated (09/02/2022)   Social Connection and Isolation Panel [NHANES]    Frequency of Communication with Friends and Family: Three times a week    Frequency of Social Gatherings with Friends and Family: Once a week    Attends Religious Services: 1 to 4 times per year    Active Member of [employer] or Organizations: No    Attends Engineer, structural: Never    Marital Status: Married    Tobacco Counseling Counseling given: Not Answered   Clinical Intake:  Pre-visit preparation completed: Yes  Pain : No/denies pain     BMI - recorded: 24.66 Nutritional Status: BMI of 19-24  Normal Nutritional Risks: None  How often do you need to have someone help you when you read instructions, pamphlets, or other written materials from your doctor or pharmacy?: 1 -  Never  Diabetic?no  Interpreter Needed?: No  Information entered by :: Lanier Ensign, LPN   Activities of Daily Living    09/02/2022   10:57 AM  In your present state of health, do you have any difficulty performing the following activities:  Hearing? 0  Vision? 0  Difficulty concentrating or making decisions? 0  Walking or climbing stairs? 0  Dressing or bathing? 0  Doing errands, shopping? 0  Preparing Food and eating ? N  Using the Toilet? N  In the past six months, have you accidently leaked urine? N  Do you have problems with loss of bowel control? N  Managing your Medications? N  Managing your Finances? N  Housekeeping or managing your Housekeeping? N    Patient Care Team: Lula Olszewski, MD as PCP - General (Internal Medicine) Randa Lynn, MD as Consulting Physician (Nephrology)  Indicate any recent Medical Services you may have received from other than [facility] providers in the past year (date may be approximate).     Assessment:   This is a routine wellness examination for [employer].  Hearing/Vision screen Hearing Screening - Comments:: Pt slight hearing loss  Vision Screening - Comments:: Pt follows up with Dr Dione Booze for  annual eye exams  Dietary issues and exercise activities discussed: Current Exercise Habits: Home exercise routine, Time (Minutes): 40   Goals Addressed             This Visit's Progress    Patient Stated       None at this time        Depression Screen    09/02/2022   11:10 AM 07/28/2022   10:26 AM 03/20/2021    3:15 PM 12/07/2018    9:31 AM 06/09/2018   10:30 AM 11/07/2015   10:41 AM 02/22/2015   12:55 PM  PHQ 2/9 Scores  PHQ - 2 Score 0 0 0 0  0 0  PHQ- 9 Score    0     Exception Documentation     Patient refusal      Fall Risk    09/02/2022   10:57 AM 07/28/2022   10:26 AM 03/20/2021    3:14 PM 05/22/2020    2:00 PM 01/31/2020    8:27 AM  Fall Risk   Falls in the past year? 0 0 0 0 1  Number falls in past yr:  0 0 0 0 0  Injury with Fall? 0 0 0 0 0  Risk for fall due to : Impaired vision No Fall Risks No Fall Risks    Follow up Falls prevention discussed Falls evaluation completed       FALL RISK PREVENTION PERTAINING TO THE HOME:  Any stairs in or around the home? Yes  If so, are there any without handrails? No  Home free of loose throw rugs in walkways, pet beds, electrical cords, etc? Yes  Adequate lighting in your home to reduce risk of falls? Yes   ASSISTIVE DEVICES UTILIZED TO PREVENT FALLS:  Life alert? Yes  Use of a cane, walker or w/c? No  Grab bars in the bathroom? No  Shower chair or bench in shower? No  Elevated toilet seat or a handicapped toilet? No   TIMED UP AND GO:  Was the test performed? Yes .  Length of time to ambulate 10 feet: 10 sec.   Gait steady and fast without use of assistive device  Cognitive Function: declined at this time         Immunizations Immunization History  Administered Date(s) Administered   Influenza,inj,Quad PF,6+ Mos 05/13/2017, 06/09/2018   Influenza-Unspecified 06/10/2022   Tdap 06/09/2018    TDAP status: Up to date  Flu Vaccine status: Up to date  Pneumococcal vaccine status: Declined,  Education has been provided regarding the importance of this vaccine but patient still declined. Advised may receive this vaccine at local pharmacy or Health Dept. Aware to provide a copy of the vaccination record if obtained from local pharmacy or Health Dept. Verbalized acceptance and understanding.   Covid-19 vaccine status: Declined, Education has been provided regarding the importance of this vaccine but patient still declined. Advised may receive this vaccine at local pharmacy or Health Dept.or vaccine clinic. Aware to provide a copy of the vaccination record if obtained from local pharmacy or Health Dept. Verbalized acceptance and understanding.  Qualifies for Shingles Vaccine? No    Screening Tests Health Maintenance  Topic Date Due    INFLUENZA VACCINE  12/11/2022   Medicare Annual Wellness (AWV)  09/02/2023   COLONOSCOPY (Pts 45-27yrs Insurance coverage will need to be confirmed)  02/13/2026   DTaP/Tdap/Td (2 - Td or Tdap) 06/09/2028   Hepatitis C Screening  Completed   HPV VACCINES  Aged Out  Pneumonia Vaccine 72+ Years old  Discontinued   COVID-19 Vaccine  Discontinued   Zoster Vaccines- Shingrix  Discontinued    Health Maintenance  There are no preventive care reminders to display for this patient.   Colorectal cancer screening: Type of screening: Colonoscopy. Completed 02/14/19. Repeat every 7 years    Additional Screening:  Hepatitis C Screening: Completed 06/09/18  Vision Screening: Recommended annual ophthalmology exams for early detection of glaucoma and other disorders of the eye. Is the patient up to date with their annual eye exam?  Yes  Who is the provider or what is the name of the office in which the patient attends annual eye exams? Dr Dione Booze  If pt is not established with a provider, would they like to be referred to a provider to establish care? No .   Dental Screening: Recommended annual dental exams for proper oral hygiene  Community Resource Referral / Chronic Care Management: CRR required this visit?  No   CCM required this visit?  No      Plan:     I have personally reviewed and noted the following in the patient's chart:   Medical and social history Use of alcohol, tobacco or illicit drugs  Current medications and supplements including opioid prescriptions. Patient is not currently taking opioid prescriptions. Functional ability and status Nutritional status Physical activity Advanced directives List of other physicians Hospitalizations, surgeries, and ER visits in previous 12 months Vitals Screenings to include cognitive, depression, and falls Referrals and appointments  In addition, I have reviewed and discussed with patient certain preventive protocols, quality  metrics, and best practice recommendations. A written personalized care plan for preventive services as well as general preventive health recommendations were provided to patient.     Marzella Schlein, LPN   1/61/0960   Nurse Notes: Pt declined cognition testing at this time.

## 2022-09-03 ENCOUNTER — Encounter: Payer: Medicare HMO | Admitting: Vascular Surgery

## 2022-09-04 ENCOUNTER — Telehealth: Payer: Self-pay | Admitting: Internal Medicine

## 2022-09-04 NOTE — Telephone Encounter (Signed)
Prescription Request  09/04/2022  LOV: 07/28/2022  What is the name of the medication or equipment?  labetalol (NORMODYNE) 300 MG tablet   Have you contacted your pharmacy to request a refill? No   Which pharmacy would you like this sent to?  [id number] - [place], [place] - [address] [place] [address] Phone: [phone] Fax: [phone]    Patient notified that their request is being sent to the clinical staff for review and that they should receive a response within 2 business days.   Please advise at Mobile [phone] (mobile)

## 2022-09-09 DIAGNOSIS — Z8673 Personal history of transient ischemic attack (TIA), and cerebral infarction without residual deficits: Secondary | ICD-10-CM

## 2022-09-09 DIAGNOSIS — I6350 Cerebral infarction due to unspecified occlusion or stenosis of unspecified cerebral artery: Secondary | ICD-10-CM | POA: Insufficient documentation

## 2022-09-09 HISTORY — DX: Personal history of transient ischemic attack (TIA), and cerebral infarction without residual deficits: Z86.73

## 2022-09-10 ENCOUNTER — Other Ambulatory Visit: Payer: Self-pay

## 2022-09-10 ENCOUNTER — Encounter: Payer: Medicare HMO | Admitting: Vascular Surgery

## 2022-09-10 DIAGNOSIS — I10 Essential (primary) hypertension: Secondary | ICD-10-CM

## 2022-09-10 MED ORDER — LABETALOL HCL 300 MG PO TABS: 300.0000 mg | ORAL_TABLET | Freq: Two times a day (BID) | ORAL | 3 refills | Status: DC

## 2022-09-10 NOTE — Telephone Encounter (Signed)
I have sent this prescription to the pharmacy on file.   

## 2022-09-25 ENCOUNTER — Encounter: Payer: Self-pay | Admitting: Pharmacist

## 2022-09-26 ENCOUNTER — Ambulatory Visit (INDEPENDENT_AMBULATORY_CARE_PROVIDER_SITE_OTHER): Payer: Medicare HMO | Admitting: Internal Medicine

## 2022-09-26 VITALS — BP 120/83 | HR 60 | Temp 97.2°F | Ht 76.0 in | Wt 202.8 lb

## 2022-09-26 DIAGNOSIS — R634 Abnormal weight loss: Secondary | ICD-10-CM

## 2022-09-26 DIAGNOSIS — I6930 Unspecified sequelae of cerebral infarction: Secondary | ICD-10-CM

## 2022-09-26 DIAGNOSIS — N186 End stage renal disease: Secondary | ICD-10-CM

## 2022-09-26 DIAGNOSIS — Z86010 Personal history of colonic polyps: Secondary | ICD-10-CM | POA: Diagnosis not present

## 2022-09-26 DIAGNOSIS — I120 Hypertensive chronic kidney disease with stage 5 chronic kidney disease or end stage renal disease: Secondary | ICD-10-CM | POA: Diagnosis not present

## 2022-09-26 DIAGNOSIS — M40209 Unspecified kyphosis, site unspecified: Secondary | ICD-10-CM | POA: Diagnosis not present

## 2022-09-26 DIAGNOSIS — N184 Chronic kidney disease, stage 4 (severe): Secondary | ICD-10-CM

## 2022-09-26 HISTORY — DX: Abnormal weight loss: R63.4

## 2022-09-26 LAB — SEDIMENTATION RATE: Sed Rate: 9 mm/hr (ref 0–20)

## 2022-09-26 LAB — C-REACTIVE PROTEIN: CRP: <1 mg/dL (ref 0.5–20.0)

## 2022-09-26 LAB — CBC
HCT: 36 % — ABNORMAL LOW (ref 39.0–52.0)
Hemoglobin: 11.9 g/dL — ABNORMAL LOW (ref 13.0–17.0)
MCHC: 32.9 g/dL (ref 30.0–36.0)
MCV: 80.8 fl (ref 78.0–100.0)
Platelets: 193 10*3/uL (ref 150.0–400.0)
RBC: 4.46 Mil/uL (ref 4.22–5.81)
RDW: 13.8 % (ref 11.5–15.5)
WBC: 3.8 10*3/uL — ABNORMAL LOW (ref 4.0–10.5)

## 2022-09-26 LAB — VITAMIN D 25 HYDROXY (VIT D DEFICIENCY, FRACTURES): VITD: 45.15 ng/mL (ref 30.00–100.00)

## 2022-09-26 LAB — MICROALBUMIN / CREATININE URINE RATIO
Creatinine,U: 198.8 mg/dL
Microalb Creat Ratio: 6.3 mg/g (ref 0.0–30.0)
Microalb, Ur: 12.5 mg/dL — ABNORMAL HIGH (ref 0.0–1.9)

## 2022-09-26 LAB — MAGNESIUM: Magnesium: 1.9 mg/dL (ref 1.5–2.5)

## 2022-09-26 LAB — TSH: TSH: 1.12 u[IU]/mL (ref 0.35–5.50)

## 2022-09-26 MED ORDER — BISOPROLOL FUMARATE 5 MG PO TABS
5.0000 mg | ORAL_TABLET | Freq: Every day | ORAL | 3 refills | Status: DC
Start: 2022-09-26 — End: 2022-12-11

## 2022-09-26 NOTE — Progress Notes (Signed)
[facility]: [phone]   Routine Medical Office Visit  Patient:  Brent Jimenez      Age: 66 y.o.       Sex:  male  Date:   09/26/2022 PCP:    Brent Olszewski, MD   Today'[facility] Provider: Lula Olszewski, MD   Assessment and Plan:   History of cerebrovascular accident (CVA) with residual deficit -     DG Bone Density; Future -     Lipid panel; Future  Kyphosis, unspecified kyphosis type, unspecified spinal region -     DG Bone Density; Future -     Bisoprolol Fumarate; Take 1 tablet (5 mg total) by mouth daily. Replaces labetalol  Dispense: 90 tablet; Refill: 3  History of colon polyps  Unintentional weight loss -     TSH -     Ambulatory referral to Gastroenterology -     DG Chest 2 View; Future -     Urinalysis, Complete -     US Abdomen Complete; Future -     Sedimentation rate -     C-reactive protein  Weight loss  ESRD due to hypertension [facility]) Assessment & Plan: Will order requested labwork Defer to Dr. Wolfgang Jimenez for management - hemodialysis planned soon   Orders: -     DG Bone Density; Future -     CBC -     CMP14+EGFR -     Microalbumin / creatinine urine ratio -     VITAMIN D 25 Hydroxy (Vit-D Deficiency, Fractures) -     Magnesium -     Lipid panel; Future -     Ambulatory referral to Nephrology -     TSH -     Ambulatory referral to Gastroenterology -     Parathyroid hormone, intact (no Ca) -     Urinalysis, Complete -     Sedimentation rate -     C-reactive protein     Treatment plan discussed and reviewed in detail. Explained medication safety and potential side effects (erectile dysfunction (ED) for bisoprolol hopefully will improve with switching off labetalol. Agreed on patient returning to office if symptoms worsen, persist, or new symptoms develop. Discussed precautions in case of needing to visit the Emergency Department. Answered all patient questions and confirmed understanding and comfort with the plan.  Encouraged patient to contact our office if they have any questions or concerns. Patient agreed to follow up in 1 month        Clinical Presentation:    67 y.o. male here today for Anxiety ( Pt has stopped Anxiety medications due to it could possibly affect kideys. Pt would also like labs Creatinine, vitamin D, Potassium, cholesterol and magnesium checked. )  HPI   The following table summarizes the actions taken during the encounter to manage (by editing,updating, adding,and resolving)  Problem  Esrd Due to Hypertension (Hcc)   Requests labwork  No results found for: "GFR" Estimated Creatinine Clearance: 18.6 mL/min (A) (by C-G formula based on SCr of 4.67 mg/dL (H)). Lab Results  Component Value Date/Time   CREATININE 4.67 (H) 09/26/2022 11:28 AM   CREATININE 4.67 (H) 01/06/2020 07:22 PM   CREATININE 3.54 (H) 06/09/2018 11:32 AM   CREATININE 3.49 (H) 05/13/2017 02:40 PM   CREATININE 3.44 (H) 11/07/2015 11:19 AM   CREATININE 3.24 (H) 09/14/2014 12:01 PM   CREATININE 3.27 (H) 12/22/2013 10:07 AM   CREATININE 3.30 (H) 06/15/2012 01:02 PM  Following closely with nephro  Prior history: Lab Results  Component Value Date   EGFR 13 (L) 09/26/2022   CREATININE 4.67 (H) 09/26/2022   CREATININE 4.67 (H) 01/06/2020   CREATININE 3.54 (H) 06/09/2018   CREATININE 3.49 (H) 05/13/2017   CREATININE 3.44 (H) 11/07/2015   VD25OH 45.15 09/26/2022   NA 139 09/26/2022   K 4.1 09/26/2022   CL 102 09/26/2022   CO2 21 09/26/2022   CALCIUM 9.1 09/26/2022   PROT 6.4 09/26/2022   ALBUMIN 4.5 09/26/2022   HGB 11.9 (L) 09/26/2022   HCT 36.0 (L) 09/26/2022   PTH 336.3 (H) 06/20/2008   PSA 0.65 09/14/2014   CRP <1.0 09/26/2022   ESRSEDRATE 9 09/26/2022   HGBA1C 5.7 03/20/2021      Component Value Date/Time   COLORURINE YELLOW 09/26/2022 1128   APPEARANCEUR CLEAR 09/26/2022 1128   LABSPEC 1.015 09/26/2022 1128   PHURINE 5.5 09/26/2022 1128   GLUCOSEU NEGATIVE 09/26/2022 1128   HGBUR  NEGATIVE 09/26/2022 1128   KETONESUR TRACE (A) 09/26/2022 1128   PROTEINUR 1+ (A) 09/26/2022 1128   NITRITE NEGATIVE 09/26/2022 1128   LEUKOCYTESUR NEGATIVE 09/26/2022 1128   MICROALBUR 12.5 (H) 09/26/2022 1128  [name], [name], MD Renal imaging: not available.    Stage 5 Chronic Kidney Disease Due to Benign Hypertension (Hcc) (Resolved)     Reviewed chart data: Active Ambulatory Problems    Diagnosis Date Noted   Hypertension 11/30/2013   BPH (benign prostatic hyperplasia) 11/30/2013   Prediabetes 11/30/2013   Vitamin D deficiency 11/30/2013   Erectile dysfunction 11/30/2013   Hyperlipidemia 11/07/2015   Sensorineural hearing loss (SNHL) of both ears 11/05/2017   ESRD due to hypertension (HCC) 02/03/2019   Hypercalcemia 02/03/2019   History of cerebrovascular accident (CVA) with residual deficit 02/03/2019   Hearing loss 07/28/2022   Acute kidney failure with lesion of tubular necrosis (HCC) 08/12/2022   Secondary hyperparathyroidism, not elsewhere classified (HCC) 08/12/2022   Proteinuria 08/12/2022   Anemia of chronic renal failure 08/12/2022   Orthostatic hypotension 08/12/2022   Cerebral artery occlusion with cerebral infarction (HCC) 09/09/2022   Kyphosis 09/26/2022   History of colon polyps 09/26/2022   Weight loss 09/26/2022   Resolved Ambulatory Problems    Diagnosis Date Noted   Other and unspecified hyperlipidemia 11/30/2013   Bilateral impacted cerumen 03/25/2021   Stage 5 chronic kidney disease due to benign hypertension (HCC) 08/12/2022   Past Medical History:  Diagnosis Date   Brain bleed (HCC) 05/12/2005   Chronic kidney disease    Chronic kidney disease, stage 4 (severe) (HCC) 02/03/2019   History of CVA (cerebrovascular accident) 02/03/2019   Hypercholesteremia    Stroke [facility])     Outpatient Medications Prior to Visit  Medication Sig   amLODipine (NORVASC) 10 MG tablet TAKE ONE TABLET BY MOUTH ONE TIME DAILY   calcitRIOL (ROCALTROL) 0.5 MCG  capsule Take 1 capsule (0.5 mcg total) by mouth daily.   Cholecalciferol 25 MCG (1000 UT) capsule Take by mouth.   cinacalcet (SENSIPAR) 30 MG tablet Take by mouth. Takes on Mondays and Fridays.   labetalol (NORMODYNE) 300 MG tablet Take 1 tablet (300 mg total) by mouth 2 (two) times daily.   pravastatin (PRAVACHOL) 40 MG tablet TAKE 1 TABLET(40 MG) BY MOUTH DAILY   [DISCONTINUED] sildenafil (VIAGRA) 50 MG tablet Take 1 tablet (50 mg total) by mouth daily as needed for erectile dysfunction. At least 24 hours between doses   [DISCONTINUED] tadalafil (CIALIS) 5 MG tablet Take 1 tablet (5 mg total) by  mouth daily. (Patient not taking: Reported on 09/02/2022)   Facility-Administered Medications Prior to Visit  Medication Dose Route Frequency Provider   0.9 %  sodium chloride infusion  500 mL Intravenous Once Benancio Deeds, MD         Clinical Data Analysis:   Physical Exam  BP 120/83   Pulse 60   Temp (!) 97.2 F (36.2 C) (Temporal)   Ht 6\' 4"  (1.93 m)   Wt 202 lb 12.8 oz (92 kg)   SpO2 97%   BMI 24.69 kg/m  Wt Readings from Last 10 Encounters:  09/26/22 202 lb 12.8 oz (92 kg)  09/02/22 202 lb 9.6 oz (91.9 kg)  07/28/22 206 lb 9.6 oz (93.7 kg)  03/20/21 229 lb 12.8 oz (104.2 kg)  01/06/20 230 lb (104.3 kg)  02/14/19 237 lb 12.8 oz (107.9 kg)  01/31/19 237 lb 12.8 oz (107.9 kg)  06/09/18 224 lb (101.6 kg)  11/09/17 224 lb 3.2 oz (101.7 kg)  05/13/17 230 lb (104.3 kg)   Vital signs reviewed.  Nursing notes reviewed. Weight trend reviewed. Abnormalities and Problem-Specific physical exam findings:  tall, quite, struggles to follow long sequences of thoughts General Appearance:  No acute distress appreciable.   Well-groomed, healthy-appearing male.  Well proportioned with no abnormal fat distribution.  Good muscle tone. Skin: Clear and well-hydrated. Pulmonary:  Normal work of breathing at rest, no respiratory distress apparent. SpO2: 97 %  Musculoskeletal: All extremities are  intact.  Neurological:  Awake, alert, oriented, and engaged.  No obvious focal neurological deficits or cognitive impairments.  Sensorium seems unclouded.   Speech is clear and coherent with logical content. Psychiatric:  Appropriate mood, pleasant and cooperative demeanor, thoughtful and engaged during the exam  Results Reviewed:    Results for orders placed or performed in visit on 09/26/22  CBC  Result Value Ref Range   WBC 3.8 (L) 4.0 - 10.5 K/uL   RBC 4.46 4.22 - 5.81 Mil/uL   Platelets 193.0 150.0 - 400.0 K/uL   Hemoglobin 11.9 (L) 13.0 - 17.0 g/dL   HCT 09.8 (L) 11.9 - 14.7 %   MCV 80.8 78.0 - 100.0 fl   MCHC 32.9 30.0 - 36.0 g/dL   RDW 82.9 56.2 - 13.0 %  CMP14+EGFR  Result Value Ref Range   Glucose 90 70 - 99 mg/dL   BUN 48 (H) 8 - 27 mg/dL   Creatinine, Ser 8.65 (H) 0.76 - 1.27 mg/dL   eGFR 13 (L) >78 IO/NGE/9.52   BUN/Creatinine Ratio 10 10 - 24   Sodium 139 134 - 144 mmol/L   Potassium 4.1 3.5 - 5.2 mmol/L   Chloride 102 96 - 106 mmol/L   CO2 21 20 - 29 mmol/L   Calcium 9.1 8.6 - 10.2 mg/dL   Total Protein 6.4 6.0 - 8.5 g/dL   Albumin 4.5 3.9 - 4.9 g/dL   Globulin, Total 1.9 1.5 - 4.5 g/dL   Albumin/Globulin Ratio 2.4 (H) 1.2 - 2.2   Bilirubin Total 0.5 0.0 - 1.2 mg/dL   Alkaline Phosphatase 59 44 - 121 IU/L   AST 19 0 - 40 IU/L   ALT 13 0 - 44 IU/L  Microalbumin / creatinine urine ratio  Result Value Ref Range   Microalb, Ur 12.5 (H) 0.0 - 1.9 mg/dL   Creatinine,U 841.3 mg/dL   Microalb Creat Ratio 6.3 0.0 - 30.0 mg/g  VITAMIN D 25 Hydroxy (Vit-D Deficiency, Fractures)  Result Value Ref Range   VITD 45.15 30.00 -  100.00 ng/mL  Magnesium  Result Value Ref Range   Magnesium 1.9 1.5 - 2.5 mg/dL  TSH  Result Value Ref Range   TSH 1.12 0.35 - 5.50 uIU/mL  Urinalysis, Complete  Result Value Ref Range   Color, Urine YELLOW YELLOW   APPearance CLEAR CLEAR   Specific Gravity, Urine 1.015 1.001 - 1.035   pH 5.5 5.0 - 8.0   Glucose, UA NEGATIVE NEGATIVE    Bilirubin Urine NEGATIVE NEGATIVE   Ketones, ur TRACE (A) NEGATIVE   Hgb urine dipstick NEGATIVE NEGATIVE   Protein, ur 1+ (A) NEGATIVE   Nitrite NEGATIVE NEGATIVE   Leukocytes,Ua NEGATIVE NEGATIVE   WBC, UA NONE SEEN 0 - 5 /HPF   RBC / HPF NONE SEEN 0 - 2 /HPF   Squamous Epithelial / HPF NONE SEEN < OR = 5 /HPF   Bacteria, UA NONE SEEN NONE SEEN /HPF   Hyaline Cast NONE SEEN NONE SEEN /LPF  Sedimentation rate  Result Value Ref Range   Sed Rate 9 0 - 20 mm/hr  C-reactive protein  Result Value Ref Range   CRP <1.0 0.5 - 20.0 mg/dL    Recent Results (from the past 2160 hour(Jimenez))  CBC     Status: Abnormal   Collection Time: 09/26/22 11:28 AM  Result Value Ref Range   WBC 3.8 (L) 4.0 - 10.5 K/uL   RBC 4.46 4.22 - 5.81 Mil/uL   Platelets 193.0 150.0 - 400.0 K/uL   Hemoglobin 11.9 (L) 13.0 - 17.0 g/dL   HCT 16.1 (L) 09.6 - 04.5 %   MCV 80.8 78.0 - 100.0 fl   MCHC 32.9 30.0 - 36.0 g/dL   RDW 40.9 81.1 - 91.4 %  CMP14+EGFR     Status: Abnormal   Collection Time: 09/26/22 11:28 AM  Result Value Ref Range   Glucose 90 70 - 99 mg/dL   BUN 48 (H) 8 - 27 mg/dL   Creatinine, Ser 7.82 (H) 0.76 - 1.27 mg/dL   eGFR 13 (L) >95 AO/ZHY/8.65   BUN/Creatinine Ratio 10 10 - 24   Sodium 139 134 - 144 mmol/L   Potassium 4.1 3.5 - 5.2 mmol/L   Chloride 102 96 - 106 mmol/L   CO2 21 20 - 29 mmol/L   Calcium 9.1 8.6 - 10.2 mg/dL   Total Protein 6.4 6.0 - 8.5 g/dL   Albumin 4.5 3.9 - 4.9 g/dL   Globulin, Total 1.9 1.5 - 4.5 g/dL   Albumin/Globulin Ratio 2.4 (H) 1.2 - 2.2   Bilirubin Total 0.5 0.0 - 1.2 mg/dL   Alkaline Phosphatase 59 44 - 121 IU/L   AST 19 0 - 40 IU/L   ALT 13 0 - 44 IU/L  Microalbumin / creatinine urine ratio     Status: Abnormal   Collection Time: 09/26/22 11:28 AM  Result Value Ref Range   Microalb, Ur 12.5 (H) 0.0 - 1.9 mg/dL   Creatinine,U 784.6 mg/dL   Microalb Creat Ratio 6.3 0.0 - 30.0 mg/g  VITAMIN D 25 Hydroxy (Vit-D Deficiency, Fractures)     Status: None    Collection Time: 09/26/22 11:28 AM  Result Value Ref Range   VITD 45.15 30.00 - 100.00 ng/mL  Magnesium     Status: None   Collection Time: 09/26/22 11:28 AM  Result Value Ref Range   Magnesium 1.9 1.5 - 2.5 mg/dL  TSH     Status: None   Collection Time: 09/26/22 11:28 AM  Result Value Ref Range   TSH 1.12  0.35 - 5.50 uIU/mL  Urinalysis, Complete     Status: Abnormal   Collection Time: 09/26/22 11:28 AM  Result Value Ref Range   Color, Urine YELLOW YELLOW   APPearance CLEAR CLEAR   Specific Gravity, Urine 1.015 1.001 - 1.035   pH 5.5 5.0 - 8.0   Glucose, UA NEGATIVE NEGATIVE   Bilirubin Urine NEGATIVE NEGATIVE   Ketones, ur TRACE (A) NEGATIVE   Hgb urine dipstick NEGATIVE NEGATIVE   Protein, ur 1+ (A) NEGATIVE   Nitrite NEGATIVE NEGATIVE   Leukocytes,Ua NEGATIVE NEGATIVE   WBC, UA NONE SEEN 0 - 5 /HPF   RBC / HPF NONE SEEN 0 - 2 /HPF   Squamous Epithelial / HPF NONE SEEN < OR = 5 /HPF   Bacteria, UA NONE SEEN NONE SEEN /HPF   Hyaline Cast NONE SEEN NONE SEEN /LPF  Sedimentation rate     Status: None   Collection Time: 09/26/22 11:28 AM  Result Value Ref Range   Sed Rate 9 0 - 20 mm/hr  C-reactive protein     Status: None   Collection Time: 09/26/22 11:28 AM  Result Value Ref Range   CRP <1.0 0.5 - 20.0 mg/dL    No image results found.   US RENAL  Result Date: 07/29/2022 CLINICAL DATA:  Acute renal dysfunction. EXAM: RENAL / URINARY TRACT ULTRASOUND COMPLETE COMPARISON:  None Available. FINDINGS: Right Kidney: Renal measurements: 9.2 x 3.5 x 3.8 cm = volume: 64 mL. Increased cortical echogenicity. Contains a 10 mm cyst. No follow-up imaging recommended for the cyst. Left Kidney: Renal measurements: 8.9 x 3.5 x 3.6 cm = volume: 58 mL. Diffuse increased echogenicity. Contains a 7 mm cyst. No follow-up imaging recommended for the cyst. Bladder: Appears normal for degree of bladder distention. Other: Poor evaluation due to lack of distention. IMPRESSION: 1. Increased cortical  echogenicity in both kidneys consistent with medical renal disease. 2. No other abnormalities. Electronically Signed   By: Gerome Sam III M.D.   On: 07/29/2022 11:50       Signed: Lula Olszewski, MD 09/27/2022 12:27 PM

## 2022-09-27 ENCOUNTER — Encounter: Payer: Self-pay | Admitting: Internal Medicine

## 2022-09-27 LAB — CMP14+EGFR
ALT: 13 IU/L (ref 0–44)
AST: 19 IU/L (ref 0–40)
Albumin/Globulin Ratio: 2.4 — ABNORMAL HIGH (ref 1.2–2.2)
Albumin: 4.5 g/dL (ref 3.9–4.9)
Alkaline Phosphatase: 59 IU/L (ref 44–121)
BUN/Creatinine Ratio: 10 (ref 10–24)
BUN: 48 mg/dL — ABNORMAL HIGH (ref 8–27)
Bilirubin Total: 0.5 mg/dL (ref 0.0–1.2)
CO2: 21 mmol/L (ref 20–29)
Calcium: 9.1 mg/dL (ref 8.6–10.2)
Chloride: 102 mmol/L (ref 96–106)
Creatinine, Ser: 4.67 mg/dL — ABNORMAL HIGH (ref 0.76–1.27)
Globulin, Total: 1.9 g/dL (ref 1.5–4.5)
Glucose: 90 mg/dL (ref 70–99)
Potassium: 4.1 mmol/L (ref 3.5–5.2)
Sodium: 139 mmol/L (ref 134–144)
Total Protein: 6.4 g/dL (ref 6.0–8.5)
eGFR: 13 mL/min/{1.73_m2} — ABNORMAL LOW (ref >59)

## 2022-09-27 LAB — URINALYSIS, COMPLETE
Bacteria, UA: NONE SEEN /HPF
Bilirubin Urine: NEGATIVE
Glucose, UA: NEGATIVE
Leukocytes,Ua: NEGATIVE

## 2022-09-27 NOTE — Assessment & Plan Note (Signed)
Will order requested labwork Defer to Dr. Wolfgang Phoenix for management - hemodialysis planned soon

## 2022-09-28 ENCOUNTER — Encounter: Payer: Self-pay | Admitting: Internal Medicine

## 2022-09-28 DIAGNOSIS — D72819 Decreased white blood cell count, unspecified: Secondary | ICD-10-CM | POA: Insufficient documentation

## 2022-09-29 LAB — PARATHYROID HORMONE, INTACT (NO CA): PTH: 295 pg/mL — ABNORMAL HIGH (ref 16–77)

## 2022-09-29 LAB — URINALYSIS, COMPLETE
Hgb urine dipstick: NEGATIVE
Hyaline Cast: NONE SEEN /LPF
Nitrite: NEGATIVE
RBC / HPF: NONE SEEN /HPF (ref 0–2)
Specific Gravity, Urine: 1.015 (ref 1.001–1.035)
Squamous Epithelial / HPF: NONE SEEN /HPF (ref <=5)
WBC, UA: NONE SEEN /HPF (ref 0–5)
pH: 5.5 (ref 5.0–8.0)

## 2022-10-02 NOTE — Progress Notes (Signed)
Please share all these recent labs by fax or other to:   Randa Lynn, MD Consulting Physician (Nephrology) Phone: [phone] Fax: [phone] [address] [place] [address] 07/28/2022 - -

## 2022-10-27 ENCOUNTER — Ambulatory Visit: Payer: Medicare HMO | Admitting: Internal Medicine

## 2022-10-28 ENCOUNTER — Ambulatory Visit: Payer: Medicare HMO | Admitting: Internal Medicine

## 2022-10-28 ENCOUNTER — Ambulatory Visit
Admission: RE | Admit: 2022-10-28 | Discharge: 2022-10-28 | Disposition: A | Discharge status: Home / Self Care | Payer: Medicare HMO | Source: Ambulatory Visit | Attending: Internal Medicine | Admitting: Internal Medicine

## 2022-10-28 DIAGNOSIS — R634 Abnormal weight loss: Secondary | ICD-10-CM

## 2022-10-30 ENCOUNTER — Ambulatory Visit (INDEPENDENT_AMBULATORY_CARE_PROVIDER_SITE_OTHER): Payer: Medicare HMO | Admitting: Internal Medicine

## 2022-10-30 ENCOUNTER — Encounter: Payer: Self-pay | Admitting: Internal Medicine

## 2022-10-30 VITALS — BP 124/88 | HR 55 | Temp 98.1°F | Ht 76.0 in | Wt 204.8 lb

## 2022-10-30 DIAGNOSIS — R399 Unspecified symptoms and signs involving the genitourinary system: Secondary | ICD-10-CM | POA: Diagnosis not present

## 2022-10-30 DIAGNOSIS — I10 Essential (primary) hypertension: Secondary | ICD-10-CM

## 2022-10-30 DIAGNOSIS — N529 Male erectile dysfunction, unspecified: Secondary | ICD-10-CM | POA: Diagnosis not present

## 2022-10-30 DIAGNOSIS — D72819 Decreased white blood cell count, unspecified: Secondary | ICD-10-CM

## 2022-10-30 DIAGNOSIS — I120 Hypertensive chronic kidney disease with stage 5 chronic kidney disease or end stage renal disease: Secondary | ICD-10-CM

## 2022-10-30 DIAGNOSIS — N184 Chronic kidney disease, stage 4 (severe): Secondary | ICD-10-CM | POA: Diagnosis not present

## 2022-10-30 DIAGNOSIS — E559 Vitamin D deficiency, unspecified: Secondary | ICD-10-CM

## 2022-10-30 DIAGNOSIS — N2581 Secondary hyperparathyroidism of renal origin: Secondary | ICD-10-CM

## 2022-10-30 DIAGNOSIS — R634 Abnormal weight loss: Secondary | ICD-10-CM

## 2022-10-30 DIAGNOSIS — N186 End stage renal disease: Secondary | ICD-10-CM

## 2022-10-30 NOTE — Progress Notes (Signed)
[facility]: [phone]   Routine Medical Office Visit  Patient:  Brent Jimenez      Age: 67 y.o.       Sex:  male  Date:   10/30/2022 PCP:    Lula Olszewski, MD   Today's Healthcare Provider: Lula Olszewski, MD   Assessment and Plan:     Jamiah was seen today for 1 month follow-up.  Erectile dysfunction, unspecified erectile dysfunction type Overview: Pde5 made his head hurt Saw urologist prior I've sent in despite above at request   Assessment & Plan: Urology referral is more for rule out urinary retention but will get both issues reviewed   Orders: -     Ambulatory referral to Urology  Lower urinary tract symptoms (LUTS) Overview: Ipss-7 on 10/2022.  Urinary Symptoms: He reports improved urination after medication change, with no frequent urination, urgency, or feeling of incomplete bladder emptying. We will refer him to a urologist for further evaluation of prostate and erectile dysfunction.  Orders: -     Ambulatory referral to Urology  Chronic kidney disease, stage 4 (severe) (HCC) -     Ambulatory referral to Urology  ESRD due to hypertension [facility]) Overview: October 30, 2022 interim history:  Kidney function improved from 13 to 19 after changing medication from Labetalol to Bisoprolol, eliminating the current need for dialysis. He is also on Cinacalcet and Sensipar for high PTH due to kidney disease. We will continue the current medications and management plan, with the next follow-up with the nephrologist scheduled for August. He is not on ARB because nephrologist ("would not start it at this point").    Nephrologist: Mosetta Pigeon, MD  Medicines:  ARB/ACEI:  not at this point per nephrology SGLT2 inhibitor:   not on - presumably due to kidney disease too advanced  Symptoms to Watch For: Changes in urination, Swelling, Fatigue, Chest pain or shortness of breath  Medicines to Avoid:  diuretics (fluid pills), NSAIDs, proton pump  inhibitor (PPI) stomach acid reducer, contrasted imaging studies, aminoglycosides, acyclovir, phosphate-based bowel prep, baclofen  Lifestyle and Nutrition:  Limit sodium, potassium, phosphorus and protein intake, consider nutrition consult, smoking avoidance, exercise regularly, manage weight, and reduce stress, control blood sugar, avoid dehydration (may need early emergency room visits for nausea vomiting diarrhea)  Vaccinations: flu/COVID/pneumonia/hepatitis B vaccinations are more important in kidney disease  Imaging: medical renal disease on ultrasound.  Bladder emptied.  Labwork Lab Results  Component Value Date   EGFR 13 (L) 09/26/2022   CREATININE 4.67 (H) 09/26/2022   CREATININE 4.67 (H) 01/06/2020   CREATININE 3.54 (H) 06/09/2018   CREATININE 3.49 (H) 05/13/2017   CREATININE 3.44 (H) 11/07/2015   VD25OH 45.15 09/26/2022   NA 139 09/26/2022   K 4.1 09/26/2022   CL 102 09/26/2022   CO2 21 09/26/2022   CALCIUM 9.1 09/26/2022   PROT 6.4 09/26/2022   ALBUMIN 4.5 09/26/2022   HGB 11.9 (L) 09/26/2022   HCT 36.0 (L) 09/26/2022   PTH 295 (H) 09/26/2022   PSA 0.65 09/14/2014   CRP <1.0 09/26/2022   ESRSEDRATE 9 09/26/2022   HGBA1C 5.7 03/20/2021   Lab Results  Component Value Date   COLORURINE YELLOW 09/26/2022   APPEARANCEUR CLEAR 09/26/2022   LABSPEC 1.015 09/26/2022   PHURINE 5.5 09/26/2022   GLUCOSEU NEGATIVE 09/26/2022   HGBUR NEGATIVE 09/26/2022   KETONESUR TRACE (A) 09/26/2022   PROTEINUR 1+ (A) 09/26/2022   NITRITE NEGATIVE 09/26/2022   LEUKOCYTESUR NEGATIVE 09/26/2022   MICROALBUR  12.5 (H) 09/26/2022     No results found for: "GFR" Estimated Creatinine Clearance: 18.6 mL/min (A) (by C-G formula based on SCr of 4.67 mg/dL (H)). Lab Results  Component Value Date/Time   CREATININE 4.67 (H) 09/26/2022 11:28 AM   CREATININE 4.67 (H) 01/06/2020 07:22 PM   CREATININE 3.54 (H) 06/09/2018 11:32 AM   CREATININE 3.49 (H) 05/13/2017 02:40 PM   CREATININE 3.44  (H) 11/07/2015 11:19 AM   CREATININE 3.24 (H) 09/14/2014 12:01 PM   CREATININE 3.27 (H) 12/22/2013 10:07 AM   CREATININE 3.30 (H) 06/15/2012 01:02 PM     Assessment & Plan: Reviewed Dr. Thea Gist note and added to care team Updated problem overview for this problem to improve longitudinal management    Primary hypertension Overview: Interim history from October 30, 2022:    Home readings:  not checking enough, but will start Patient reports taking current medications consistently and not experiencing any significant associated side effects or symptoms. Current hypertension medications:       Sig   amLODipine (NORVASC) 10 MG tablet (Taking) TAKE ONE TABLET BY MOUTH ONE TIME DAILY   bisoprolol (ZEBETA) 5 MG tablet (Taking) Take 1 tablet (5 mg total) by mouth daily. Replaces labetalol      Lab Results  Component Value Date   NA 139 09/26/2022   K 4.1 09/26/2022   CREATININE 4.67 (H) 09/26/2022     Assessment & Plan: Hypertension: Blood pressure is well controlled at 124/88 with Bisoprolol 5mg . We will continue Bisoprolol 5mg  and monitor blood pressure at home once or twice a week.  Reviewed available data from patient and  BP Readings from Last 3 Encounters:  10/30/22 124/88  09/26/22 120/83  09/02/22 (!) 120/90   My individualized, goal average blood pressure for this patient, after considering the evidence for and against aggressive blood pressure goals as well as their past medical history and preferences, is 140/90 but ultimately I defer to nephrologist for likely more aggressive goals. In my medical opinion, this problem is stable, well controlled  Will continue the current medication(s), unchanged:  Current hypertension medications:       Sig   amLODipine (NORVASC) 10 MG tablet (Taking) TAKE ONE TABLET BY MOUTH ONE TIME DAILY   bisoprolol (ZEBETA) 5 MG tablet (Taking) Take 1 tablet (5 mg total) by mouth daily. Replaces labetalol      This was decided by careful and  shared decision making based on collaborative review of their situation and the associated data.    Information for patient review: Please limit and avoid: salt, alcohol, NSAIDS, excess body weight, smoking, stress, sedentary lifestyles The risks of poor control over time are FUTURE stroke and heart attacks, but if you have a blood pressure over 180 and any red flag symptoms(headache/shortness of breath/confusion/chest discomfort) you should go to the ER.  You are encouraged to do home blood pressure monitoring, at least as many times per week as blood pressure medications you are on.  For example, bring a diary with 3 home blood pressure readings per week to each visit if you are on 3 blood pressure medications.   If you are on more than 3 medication(s) or have certain risk factors, we should do a resistant hypertension workup See AFTER VISIT SUMMARY for addition educational information provided Please let me know in advance when you need medication(s) refills, consistently taking your medication is very important!    Weight loss Overview:  Weight Loss: His weight has stabilized, and a recent abdominal  ultrasound showed no abnormalities. No further workup is needed unless weight loss resumes. We will consider canceling the GI referral for a colonoscopy unless weight loss resumes or if there is blood in stool or stomach discomfort.  Lab Results  Component Value Date   TSH 1.12 09/26/2022   Lab Results  Component Value Date   HGBA1C 5.7 03/20/2021    Wt Readings from Last 10 Encounters:  10/30/22 204 lb 12.8 oz (92.9 kg)  09/26/22 202 lb 12.8 oz (92 kg)  09/02/22 202 lb 9.6 oz (91.9 kg)  07/28/22 206 lb 9.6 oz (93.7 kg)  03/20/21 229 lb 12.8 oz (104.2 kg)  01/06/20 230 lb (104.3 kg)  02/14/19 237 lb 12.8 oz (107.9 kg)  01/31/19 237 lb 12.8 oz (107.9 kg)  06/09/18 224 lb (101.6 kg)  11/09/17 224 lb 3.2 oz (101.7 kg)    Assessment & Plan: Today's assessment and plan included in  todays problem overview update  Updated problem overview for this problem to improve longitudinal management    Secondary hyperparathyroidism (HCC) Overview: GFR Suggested Monitoring  30-59 calcium and phosphate every 6-12 months. PTH every 12 months. If hyperphosphatemia or under treatment, may be assessed every three months.    15-29 calcium and phosphate every 3-6 months. PTH every 6-12 months. More frequently if abnormals.PTH a minimum of every 3-6. The frequency of testing depends on whether the patient is being treated or not.  <15 Calcium and phosphate every 1-3 months.   Vitamin D: annually, unless PTH high, vitamin D low, or on treatment then its every 6 months  Alkaline Phosphatase: Annually, or more often if there are signs of increased bone turnover or changes in PTH treatment.  Lab Results  Component Value Date   PTH 295 (H) 09/26/2022   VD25OH 45.15 09/26/2022   CALCIUM 9.1 09/26/2022   PHOS 3.8 12/18/2008   ALKPHOS 59 09/26/2022    Nephrologist: Mosetta Pigeon, MD defer to him for treatment(s).  Assessment & Plan: Updated problem overview for this problem to improve longitudinal management  Helped educate on basis for Dr. Doristine Church treatment recommendations    Vitamin D deficiency Overview: Managed by nephrology Takes 2000 daily as per nephrology Clinical symptoms that can be associated with vitamin D deficiency include bone pain, muscle weakness, and general fatigue Vitamin D levels are affected by dietary choices and sunlight exposure  Lab Results  Component Value Date   VD25OH 45.15 09/26/2022   VD25OH 40 11/07/2015   VD25OH 15 (L) 06/15/2012   Lab Results  Component Value Date   TSH 1.12 09/26/2022   ALKPHOS 59 09/26/2022   PTH 295 (H) 09/26/2022   CALCIUM 9.1 09/26/2022   PHOS 3.8 12/18/2008   Lab Results  Component Value Date   CALCIUM 9.1 09/26/2022   CALCIUM 9.1 01/06/2020   CALCIUM 9.9 06/09/2018   CALCIUM 9.1 05/13/2017   CALCIUM 9.3  11/07/2015               Clinical Presentation:    67 y.o. male here today for 1 month follow-up  AI-Extracted: Discussed the use of AI scribe software for clinical note transcription with the patient, who gave verbal consent to proceed.  History of Present Illness   The patient also reported no side effects from the new medication and stable blood pressure readings.  Previously, the patient had been experiencing difficulty with urination, which improved after the medication change. However, the patient reported occasional instances of interrupted urine flow. Nighttime urination was reported  to occur once or twice, which was not considered frequent by the patient.  The patient also reported a history of unintentional weight loss, which has since stabilized.  In addition to kidney disease, the patient has been diagnosed with advanced kidney disease and hyperparathyroidism. The patient is currently taking several medications, including calcium and vitamin D supplements, to manage these conditions.  In summary, the patient presented for a follow-up visit after a medication change and reported significant improvement in kidney function and urination. The patient's unintentional weight loss has stabilized, and no abnormalities were found on an abdominal ultrasound. The patient continues to manage advanced kidney disease and hyperparathyroidism with medication.        Reviewed chart data: Active Ambulatory Problems    Diagnosis Date Noted   Hypertension 11/30/2013   BPH (benign prostatic hyperplasia) 11/30/2013   Prediabetes 11/30/2013   Vitamin D deficiency 11/30/2013   Erectile dysfunction 11/30/2013   Hyperlipidemia 11/07/2015   Sensorineural hearing loss (SNHL) of both ears 11/05/2017   ESRD due to hypertension (HCC) 02/03/2019   History of cerebrovascular accident (CVA) with residual deficit 02/03/2019   Hearing loss 07/28/2022   Acute kidney failure with lesion of tubular  necrosis (HCC) 08/12/2022   Secondary hyperparathyroidism (HCC) 02/03/2019   Proteinuria 08/12/2022   Anemia of chronic renal failure 08/12/2022   Orthostatic hypotension 08/12/2022   Cerebral artery occlusion with cerebral infarction (HCC) 09/09/2022   Kyphosis 09/26/2022   History of colon polyps 09/26/2022   Weight loss 09/26/2022   Leucopenia 09/28/2022   Lower urinary tract symptoms (LUTS) 10/30/2022   Resolved Ambulatory Problems    Diagnosis Date Noted   Other and unspecified hyperlipidemia 11/30/2013   Bilateral impacted cerumen 03/25/2021   Stage 5 chronic kidney disease due to benign hypertension (HCC) 08/12/2022   Past Medical History:  Diagnosis Date   Brain bleed (HCC) 05/12/2005   Chronic kidney disease    Chronic kidney disease, stage 4 (severe) (HCC) 02/03/2019   History of CVA (cerebrovascular accident) 02/03/2019   Hypercalcemia 02/03/2019   Hypercholesteremia    Secondary hyperparathyroidism, not elsewhere classified (HCC) 08/12/2022   Stroke [facility])     Outpatient Medications Prior to Visit  Medication Sig   amLODipine (NORVASC) 10 MG tablet TAKE ONE TABLET BY MOUTH ONE TIME DAILY   bisoprolol (ZEBETA) 5 MG tablet Take 1 tablet (5 mg total) by mouth daily. Replaces labetalol   calcitRIOL (ROCALTROL) 0.5 MCG capsule Take 1 capsule (0.5 mcg total) by mouth daily.   Cholecalciferol 25 MCG (1000 UT) capsule Take by mouth.   cinacalcet (SENSIPAR) 30 MG tablet Take by mouth. Takes on Mondays and Fridays.   pravastatin (PRAVACHOL) 40 MG tablet TAKE 1 TABLET(40 MG) BY MOUTH DAILY   [DISCONTINUED] labetalol (NORMODYNE) 300 MG tablet Take 1 tablet (300 mg total) by mouth 2 (two) times daily.   Facility-Administered Medications Prior to Visit  Medication Dose Route Frequency Provider   0.9 %  sodium chloride infusion  500 mL Intravenous Once Armbruster, Willaim Rayas, MD         Clinical Data Analysis:   Physical Exam  BP 124/88 (BP Location: Left Arm, Patient  Position: Sitting)   Pulse (!) 55   Temp 98.1 F (36.7 C) (Temporal)   Ht 6\' 4"  (1.93 m)   Wt 204 lb 12.8 oz (92.9 kg)   SpO2 99%   BMI 24.93 kg/m  Wt Readings from Last 10 Encounters:  10/30/22 204 lb 12.8 oz (92.9 kg)  09/26/22 202 lb  12.8 oz (92 kg)  09/02/22 202 lb 9.6 oz (91.9 kg)  07/28/22 206 lb 9.6 oz (93.7 kg)  03/20/21 229 lb 12.8 oz (104.2 kg)  01/06/20 230 lb (104.3 kg)  02/14/19 237 lb 12.8 oz (107.9 kg)  01/31/19 237 lb 12.8 oz (107.9 kg)  06/09/18 224 lb (101.6 kg)  11/09/17 224 lb 3.2 oz (101.7 kg)   Vital signs reviewed.  Nursing notes reviewed. Weight trend reviewed. Abnormalities and Problem-Specific physical exam findings:  weight stable x 3 months reviewed   General Appearance:  No acute distress appreciable.   Well-groomed, healthy-appearing male.  Well proportioned with no abnormal fat distribution.  Good muscle tone. Skin: Clear and well-hydrated. Pulmonary:  Normal work of breathing at rest, no respiratory distress apparent. SpO2: 99 %  Musculoskeletal: All extremities are intact.  Neurological:  Awake, alert, oriented, and engaged.  No obvious focal neurological deficits or cognitive impairments.  Sensorium seems unclouded.   Speech is clear and coherent with logical content. Psychiatric:  Appropriate mood, pleasant and cooperative demeanor, thoughtful and engaged during the exam  Results Reviewed:    No results found for any visits on 10/30/22.  Office Visit on 09/26/2022  Component Date Value   WBC 09/26/2022 3.8 (L)    RBC 09/26/2022 4.46    Platelets 09/26/2022 193.0    Hemoglobin 09/26/2022 11.9 (L)    HCT 09/26/2022 36.0 (L)    MCV 09/26/2022 80.8    MCHC 09/26/2022 32.9    RDW 09/26/2022 13.8    Glucose 09/26/2022 90    BUN 09/26/2022 48 (H)    Creatinine, Ser 09/26/2022 4.67 (H)    eGFR 09/26/2022 13 (L)    BUN/Creatinine Ratio 09/26/2022 10    Sodium 09/26/2022 139    Potassium 09/26/2022 4.1    Chloride 09/26/2022 102    CO2  09/26/2022 21    Calcium 09/26/2022 9.1    Total Protein 09/26/2022 6.4    Albumin 09/26/2022 4.5    Globulin, Total 09/26/2022 1.9    Albumin/Globulin Ratio 09/26/2022 2.4 (H)    Bilirubin Total 09/26/2022 0.5    Alkaline Phosphatase 09/26/2022 59    AST 09/26/2022 19    ALT 09/26/2022 13    Microalb, Ur 09/26/2022 12.5 (H)    Creatinine,U 09/26/2022 198.8    Microalb Creat Ratio 09/26/2022 6.3    VITD 09/26/2022 45.15    Magnesium 09/26/2022 1.9    TSH 09/26/2022 1.12    PTH 09/26/2022 295 (H)    Color, Urine 09/26/2022 YELLOW    APPearance 09/26/2022 CLEAR    Specific Gravity, Urine 09/26/2022 1.015    pH 09/26/2022 5.5    Glucose, UA 09/26/2022 NEGATIVE    Bilirubin Urine 09/26/2022 NEGATIVE    Ketones, ur 09/26/2022 TRACE (A)    Hgb urine dipstick 09/26/2022 NEGATIVE    Protein, ur 09/26/2022 1+ (A)    Nitrite 09/26/2022 NEGATIVE    Leukocytes,Ua 09/26/2022 NEGATIVE    WBC, UA 09/26/2022 NONE SEEN    RBC / HPF 09/26/2022 NONE SEEN    Squamous Epithelial / HPF 09/26/2022 NONE SEEN    Bacteria, UA 09/26/2022 NONE SEEN    Hyaline Cast 09/26/2022 NONE SEEN    Sed Rate 09/26/2022 9    CRP 09/26/2022 <1.0    No image results found.   US Abdomen Complete  Result Date: 10/28/2022 CLINICAL DATA:  Unintentional weight loss. EXAM: ABDOMEN ULTRASOUND COMPLETE COMPARISON:  Ultrasound renal 07/29/2022 FINDINGS: Gallbladder: No gallstones or wall thickening visualized. No sonographic Eulah Pont  sign noted by sonographer. Common bile duct: Diameter: 1.9 mL Liver: No focal lesion identified. Within normal limits in parenchymal echogenicity. Portal vein is patent on color Doppler imaging with normal direction of blood flow towards the liver. IVC: No abnormality visualized. Pancreas: Visualized portion unremarkable. Spleen: Size and appearance within normal limits. Right Kidney: Length: 10.1 cm. Increased cortical echogenicity. Normal cortical thickness. No hydronephrosis or mass. Left Kidney:  Length: 10.8 cm. Increased cortical echogenicity. Normal cortical thickness. No hydronephrosis or mass. Abdominal aorta: No aneurysm visualized. Other findings: None. IMPRESSION: 1. No cholelithiasis or sonographic evidence for acute cholecystitis. 2. Increased cortical echogenicity within the bilateral kidneys suggestive of medical renal disease. Electronically Signed   By: Annia Belt M.D.   On: 10/28/2022 09:46       This encounter employed real-time, collaborative documentation. The patient actively reviewed and updated their medical record on a shared screen, ensuring transparency and facilitating joint problem-solving for the problem list, overview, and plan. This approach promotes accurate, informed care. The treatment plan was discussed and reviewed in detail, including medication safety, potential side effects, and all patient questions. We confirmed understanding and comfort with the plan. Follow-up instructions were established, including contacting the office for any concerns, returning if symptoms worsen, persist, or new symptoms develop, and precautions for potential emergency department visits. ----------------------------------------------------- Lula Olszewski, MD  10/31/2022 12:30 PM  [facility]:  [phone]

## 2022-10-30 NOTE — Assessment & Plan Note (Signed)
Reviewed Dr. Thea Gist note and added to care team Updated problem overview for this problem to improve longitudinal management

## 2022-10-30 NOTE — Assessment & Plan Note (Addendum)
Hypertension: Blood pressure is well controlled at 124/88 with Bisoprolol 5mg . We will continue Bisoprolol 5mg  and monitor blood pressure at home once or twice a week.  Reviewed available data from patient and  BP Readings from Last 3 Encounters:  10/30/22 124/88  09/26/22 120/83  09/02/22 (!) 120/90   My individualized, goal average blood pressure for this patient, after considering the evidence for and against aggressive blood pressure goals as well as their past medical history and preferences, is 140/90 but ultimately I defer to nephrologist for likely more aggressive goals. In my medical opinion, this problem is stable, well controlled  Will continue the current medication(s), unchanged:  Current hypertension medications:       Sig   amLODipine (NORVASC) 10 MG tablet (Taking) TAKE ONE TABLET BY MOUTH ONE TIME DAILY   bisoprolol (ZEBETA) 5 MG tablet (Taking) Take 1 tablet (5 mg total) by mouth daily. Replaces labetalol      This was decided by careful and shared decision making based on collaborative review of their situation and the associated data.    Information for patient review: Please limit and avoid: salt, alcohol, NSAIDS, excess body weight, smoking, stress, sedentary lifestyles The risks of poor control over time are FUTURE stroke and heart attacks, but if you have a blood pressure over 180 and any red flag symptoms(headache/shortness of breath/confusion/chest discomfort) you should go to the ER.  You are encouraged to do home blood pressure monitoring, at least as many times per week as blood pressure medications you are on.  For example, bring a diary with 3 home blood pressure readings per week to each visit if you are on 3 blood pressure medications.   If you are on more than 3 medication(s) or have certain risk factors, we should do a resistant hypertension workup See AFTER VISIT SUMMARY for addition educational information provided Please let me know in advance when you  need medication(s) refills, consistently taking your medication is very important!

## 2022-10-30 NOTE — Patient Instructions (Addendum)
VISIT SUMMARY:  During your recent visit, we discussed your improved kidney function and stable blood pressure, thanks to the changes in your medication. We also noted that your urinary symptoms have improved and your unintentional weight loss has stabilized. We will continue to monitor your kidney disease and hypertension, and have scheduled further appointments and tests to ensure your health is on the right track.  YOUR PLAN:  -CHRONIC KIDNEY DISEASE: Your kidney function has improved after changing your medication, which means your kidneys are doing a better job of cleaning your blood. We will continue with your current medications and management plan, and you have a follow-up appointment with the kidney specialist (nephrologist) in August.  -HYPERTENSION: Your blood pressure is well controlled with your current medication, which means your heart isn't working too hard to pump blood. We will continue with your current medication and ask you to monitor your blood pressure at home once or twice a week.  Also check heart rates and let us know if under 50  -URINARY SYMPTOMS: Your urination has improved after the medication change, which means your body is better able to get rid of waste. We will refer you to a urologist for further evaluation of your prostate and any issues with sexual function.  -WEIGHT LOSS: Your weight has stabilized, and a recent scan of your abdomen showed no problems. We don't need to do any more tests unless your weight starts to drop again. We will consider canceling the referral for a colonoscopy unless weight loss resumes or if there is blood in stool or stomach discomfort.  -GENERAL HEALTH MAINTENANCE: You have a bone density test scheduled for August to check the strength of your bones. You will continue taking Vitamin D daily, with a follow-up in 3-6 months or sooner if weight loss resumes.  INSTRUCTIONS:  Please continue to take your medications as prescribed and  monitor your blood pressure at home once or twice a week. Make sure to attend your follow-up appointments and tests as scheduled. If you notice any changes in your health, such as resuming weight loss, blood in stool, or stomach discomfort, please contact us immediately.     CINACALCET  You have been diagnosed with a condition known as secondary hyperparathyroidism. This condition is often associated with chronic kidney disease (CKD)1. When your kidneys are not able to filter out waste products effectively, it can lead to an excess of parathyroid hormone (PTH) in your blood.  PTH is responsible for balancing the amount of calcium and phosphorus in your blood, which are essential for the healthy functioning of your bones, heart, muscles, nerves, and blood vessels1. However, too much PTH can cause calcium to be pushed out of your bones, leading to weaker bones and potential calcium deposits in your heart, lungs, and blood vessels1.  To manage this condition, your doctor has prescribed Cinacalcet (also known as Sensipar). This medication works by lowering the amount of PTH in your blood, helping to balance the levels of calcium and phosphorus. It's important to take this medication once a day, preferably with food or right after eating. The tablet should be swallowed whole and not crushed, chewed, or broken1.  Please note that while taking Cinacalcet, you will need to have regular blood tests to measure your levels of PTH, calcium, and phosphorus1. This will help your healthcare team monitor your condition and adjust your treatment as necessary.  As with all medications, Cinacalcet may cause side effects. The most common ones include nausea, vomiting,  diarrhea, and muscle pain1. If these symptoms persist or become severe, please contact your healthcare provider.  Remember, it's important to take your medication as prescribed and not to miss any doses. If you forget to take a dose, skip it and take  your next dose at the usual time. Do not take a double dose.  Your health and well-being are our top priority. If you have any questions or concerns, please don't hesitate to reach out to your healthcare team.

## 2022-10-30 NOTE — Assessment & Plan Note (Signed)
Today's assessment and plan included in todays problem overview update  Updated problem overview for this problem to improve longitudinal management  

## 2022-10-31 NOTE — Assessment & Plan Note (Signed)
Will obtain full blood workup on next labs, 3-6 months

## 2022-10-31 NOTE — Assessment & Plan Note (Signed)
Urology referral is more for rule out urinary retention but will get both issues reviewed

## 2022-10-31 NOTE — Assessment & Plan Note (Addendum)
Updated problem overview for this problem to improve longitudinal management  Helped educate on basis for Dr. Doristine Church treatment recommendations

## 2022-11-04 ENCOUNTER — Ambulatory Visit: Payer: Medicare HMO | Admitting: Internal Medicine

## 2022-11-07 ENCOUNTER — Ambulatory Visit (INDEPENDENT_AMBULATORY_CARE_PROVIDER_SITE_OTHER): Payer: Medicare HMO | Admitting: Family Medicine

## 2022-11-07 ENCOUNTER — Encounter: Payer: Self-pay | Admitting: Family Medicine

## 2022-11-07 VITALS — BP 150/88 | HR 56 | Temp 98.3°F | Ht 76.0 in | Wt 203.6 lb

## 2022-11-07 DIAGNOSIS — R1030 Lower abdominal pain, unspecified: Secondary | ICD-10-CM | POA: Diagnosis not present

## 2022-11-07 DIAGNOSIS — I6930 Unspecified sequelae of cerebral infarction: Secondary | ICD-10-CM

## 2022-11-07 DIAGNOSIS — N184 Chronic kidney disease, stage 4 (severe): Secondary | ICD-10-CM

## 2022-11-07 DIAGNOSIS — I10 Essential (primary) hypertension: Secondary | ICD-10-CM

## 2022-11-07 LAB — POCT URINALYSIS DIPSTICK
Bilirubin, UA: NEGATIVE
Blood, UA: NEGATIVE
Glucose, UA: NEGATIVE
Ketones, UA: NEGATIVE
Leukocytes, UA: NEGATIVE
Nitrite, UA: NEGATIVE
Protein, UA: POSITIVE — AB
Spec Grav, UA: 1.015 (ref 1.010–1.025)
Urobilinogen, UA: 0.2 E.U./dL
pH, UA: 6 (ref 5.0–8.0)

## 2022-11-07 LAB — CBC WITH DIFFERENTIAL/PLATELET
Basophils Absolute: 0 10*3/uL (ref 0.0–0.1)
Basophils Relative: 0.6 % (ref 0.0–3.0)
Eosinophils Absolute: 0.4 10*3/uL (ref 0.0–0.7)
Eosinophils Relative: 6.8 % — ABNORMAL HIGH (ref 0.0–5.0)
HCT: 37.7 % — ABNORMAL LOW (ref 39.0–52.0)
Hemoglobin: 11.9 g/dL — ABNORMAL LOW (ref 13.0–17.0)
Lymphocytes Relative: 31.6 % (ref 12.0–46.0)
Lymphs Abs: 1.7 10*3/uL (ref 0.7–4.0)
MCHC: 31.7 g/dL (ref 30.0–36.0)
MCV: 81.7 fl (ref 78.0–100.0)
Monocytes Absolute: 0.4 10*3/uL (ref 0.1–1.0)
Monocytes Relative: 7.5 % (ref 3.0–12.0)
Neutro Abs: 2.9 10*3/uL (ref 1.4–7.7)
Neutrophils Relative %: 53.5 % (ref 43.0–77.0)
Platelets: 224 10*3/uL (ref 150.0–400.0)
RBC: 4.61 Mil/uL (ref 4.22–5.81)
RDW: 13.6 % (ref 11.5–15.5)
WBC: 5.5 10*3/uL (ref 4.0–10.5)

## 2022-11-07 LAB — COMPREHENSIVE METABOLIC PANEL
ALT: 13 U/L (ref 0–53)
AST: 17 U/L (ref 0–37)
Albumin: 4.5 g/dL (ref 3.5–5.2)
Alkaline Phosphatase: 64 U/L (ref 39–117)
BUN: 35 mg/dL — ABNORMAL HIGH (ref 6–23)
CO2: 31 mEq/L (ref 19–32)
Calcium: 10.3 mg/dL (ref 8.4–10.5)
Chloride: 105 mEq/L (ref 96–112)
Creatinine, Ser: 3.53 mg/dL — ABNORMAL HIGH (ref 0.40–1.50)
GFR: 17.01 mL/min — ABNORMAL LOW (ref >60.00)
Glucose, Bld: 93 mg/dL (ref 70–99)
Potassium: 4.3 mEq/L (ref 3.5–5.1)
Sodium: 142 mEq/L (ref 135–145)
Total Bilirubin: 0.7 mg/dL (ref 0.2–1.2)
Total Protein: 7.1 g/dL (ref 6.0–8.3)

## 2022-11-07 LAB — SEDIMENTATION RATE: Sed Rate: 24 mm/hr — ABNORMAL HIGH (ref 0–20)

## 2022-11-07 LAB — CK: Total CK: 214 U/L (ref 7–232)

## 2022-11-07 NOTE — Progress Notes (Signed)
Subjective  CC:  Chief Complaint  Patient presents with   Urinary Frequency    Frequent urination has been going on for the past 6 mos and has gotten worse   Groin Pain    Groin pain for the past week and has gotten worse   Same day acute visit; PCP not available. New pt to me. Chart reviewed.   HPI: Brent Jimenez is a 67 y.o. male who presents to the office today to address the problems listed above in the chief complaint. 67 year old male with stage IV chronic kidney disease, hypertension, hyperlipidemia undergoing evaluation for renal transplant presents due to lower abdominal/pelvic pain.  Patient reports over the last week he has pain with certain movements: It hurts to sit up from a lying position or stand up from a chair.  He has a pulling sensation in his lower abdomen, kind of like when his abdomen was sore when he used to exercise or if he got hit in the lower gut.  He denies any trauma, overuse, exercise.  If he is not moving in those ways, he has no pain.  No GI symptoms, specifically no nausea, vomiting, constipation, diarrhea.  He has chronic urinary frequency which is unchanged.  No dysuria.  He denies noticing any bulge or tender areas in the lower abdomen.  He has no hip pain or other muscle pain.  Appetite is good.  No fevers or chills.  Assessment  1. Lower abdominal pain   2. CKD (chronic kidney disease) stage 4, GFR 15-29 ml/min (HCC)   3. History of cerebrovascular accident (CVA) with residual deficit   4. Primary hypertension      Plan  Lower abdominal/pelvic pain: Unclear etiology.  Atypical presentation.  History and exam are most consistent with a muscular etiology.  Will check lab work to ensure no other parent etiologies.  No visible or palpable hernias are present.  He has a benign abdomen and GU examination.  Sitting and standing he appears perfectly well without any pain.  It does cause pain to sit up from a lying position.  Will treat with Tylenol and  monitor over the next several days.  If pain worsens he and his wife will go to the emergency room.  Otherwise follow-up with PCP next week.  Follow up: As needed 05/01/2023  Orders Placed This Encounter  Procedures   CBC with Differential/Platelet   CK   Comprehensive metabolic panel   Sedimentation rate   POCT Urinalysis Dipstick   No orders of the defined types were placed in this encounter.     I reviewed the patients updated PMH, FH, and SocHx.    Patient Active Problem List   Diagnosis Date Noted   Lower urinary tract symptoms (LUTS) 10/30/2022   Leucopenia 09/28/2022   Kyphosis 09/26/2022   History of colon polyps 09/26/2022   Weight loss 09/26/2022   Cerebral artery occlusion with cerebral infarction (HCC) 09/09/2022   Acute kidney failure with lesion of tubular necrosis (HCC) 08/12/2022   Proteinuria 08/12/2022   Anemia of chronic renal failure 08/12/2022   Orthostatic hypotension 08/12/2022   Hearing loss 07/28/2022   ESRD due to hypertension (HCC) 02/03/2019   History of cerebrovascular accident (CVA) with residual deficit 02/03/2019   Secondary hyperparathyroidism (HCC) 02/03/2019   Sensorineural hearing loss (SNHL) of both ears 11/05/2017   Hyperlipidemia 11/07/2015   Hypertension 11/30/2013   BPH (benign prostatic hyperplasia) 11/30/2013   Prediabetes 11/30/2013   Vitamin D deficiency 11/30/2013  Erectile dysfunction 11/30/2013   Current Meds  Medication Sig   amLODipine (NORVASC) 10 MG tablet TAKE ONE TABLET BY MOUTH ONE TIME DAILY   bisoprolol (ZEBETA) 5 MG tablet Take 1 tablet (5 mg total) by mouth daily. Replaces labetalol   calcitRIOL (ROCALTROL) 0.5 MCG capsule Take 1 capsule (0.5 mcg total) by mouth daily.   Cholecalciferol 25 MCG (1000 UT) capsule Take by mouth.   cinacalcet (SENSIPAR) 30 MG tablet Take by mouth. Takes on Mondays and Fridays.   pravastatin (PRAVACHOL) 40 MG tablet TAKE 1 TABLET(40 MG) BY MOUTH DAILY   Current  Facility-Administered Medications for the 11/07/22 encounter (Office Visit) with Willow Ora, MD  Medication   0.9 %  sodium chloride infusion    Allergies: Patient has No Known Allergies. Family History: Patient family history includes Heart disease in his mother; Hypertension in his father; Stroke (age of onset: 7) in his father. Social History:  Patient  reports that he has never smoked. He has never used smokeless tobacco. He reports that he does not drink alcohol and does not use drugs.  Review of Systems: Constitutional: Negative for fever malaise or anorexia Cardiovascular: negative for chest pain Respiratory: negative for SOB or persistent cough Gastrointestinal: negative for abdominal pain  Objective  Vitals: BP (!) 150/88   Pulse (!) 56   Temp 98.3 F (36.8 C)   Ht 6\' 4"  (1.93 m)   Wt 203 lb 9.6 oz (92.4 kg)   SpO2 99%   BMI 24.78 kg/m  General: no acute distress , A&Ox3, able to move to and from exam table easily HEENT: PEERL, conjunctiva normal, neck is supple Cardiovascular:  RRR without murmur or gallop.  Respiratory:  Good breath sounds bilaterally, CTAB with normal respiratory effort Abdomen: Soft, nontender, normal bowel sounds, no abdominal hernias or tenderness present.  No hepatosplenomegaly GU: GU anatomy, normal scrotum, no swelling, nontender testicles, no palpable hernias or inguinal hernias Skin:  Warm, no rashes  Commons side effects, risks, benefits, and alternatives for medications and treatment plan prescribed today were discussed, and the patient expressed understanding of the given instructions. Patient is instructed to call or message via MyChart if he/she has any questions or concerns regarding our treatment plan. No barriers to understanding were identified. We discussed Red Flag symptoms and signs in detail. Patient expressed understanding regarding what to do in case of urgent or emergency type symptoms.  Medication list was reconciled,  printed and provided to the patient in AVS. Patient instructions and summary information was reviewed with the patient as documented in the AVS. This note was prepared with assistance of Dragon voice recognition software. Occasional wrong-word or sound-a-like substitutions may have occurred due to the inherent limitations of voice recognition software

## 2022-11-10 ENCOUNTER — Ambulatory Visit: Payer: Medicare HMO | Admitting: Internal Medicine

## 2022-11-10 NOTE — Progress Notes (Signed)
See mychart note Dear Mr. Brent Jimenez, It was nice meeting you. Your lab results are all stable and do not point to an internal cause for your pain. I believe it is muscular in nature as we discussed but if it persists, I recommend a visit with Dr. Jon Billings to explore further.  Sincerely, Dr. Mardelle Matte

## 2022-11-21 ENCOUNTER — Ambulatory Visit (INDEPENDENT_AMBULATORY_CARE_PROVIDER_SITE_OTHER)
Admission: RE | Admit: 2022-11-21 | Discharge: 2022-11-21 | Disposition: A | Discharge status: Home / Self Care | Payer: Medicare HMO | Source: Ambulatory Visit | Attending: Internal Medicine | Admitting: Internal Medicine

## 2022-11-21 DIAGNOSIS — N186 End stage renal disease: Secondary | ICD-10-CM

## 2022-11-21 DIAGNOSIS — I6930 Unspecified sequelae of cerebral infarction: Secondary | ICD-10-CM

## 2022-11-21 DIAGNOSIS — M40209 Unspecified kyphosis, site unspecified: Secondary | ICD-10-CM

## 2022-11-21 DIAGNOSIS — Z13820 Encounter for screening for osteoporosis: Secondary | ICD-10-CM | POA: Diagnosis not present

## 2022-11-21 DIAGNOSIS — R634 Abnormal weight loss: Secondary | ICD-10-CM

## 2022-11-21 DIAGNOSIS — I120 Hypertensive chronic kidney disease with stage 5 chronic kidney disease or end stage renal disease: Secondary | ICD-10-CM

## 2022-11-22 DIAGNOSIS — Z01818 Encounter for other preprocedural examination: Secondary | ICD-10-CM | POA: Insufficient documentation

## 2022-11-24 ENCOUNTER — Telehealth: Payer: Self-pay | Admitting: Internal Medicine

## 2022-11-24 NOTE — Telephone Encounter (Signed)
Prescription Request  11/24/2022  LOV: 10/30/2022  What is the name of the medication or equipment? cinacalcet (SENSIPAR) 30 MG tablet   Have you contacted your pharmacy to request a refill? Yes   Which pharmacy would you like this sent to?  [id number] - [place], [place] - [address] [place] [address] Phone: [phone] Fax: [phone]    Patient notified that their request is being sent to the clinical staff for review and that they should receive a response within 2 business days.   Please advise at Mobile [phone] (mobile)

## 2022-11-25 ENCOUNTER — Other Ambulatory Visit: Payer: Self-pay

## 2022-11-25 DIAGNOSIS — N2581 Secondary hyperparathyroidism of renal origin: Secondary | ICD-10-CM

## 2022-11-26 ENCOUNTER — Other Ambulatory Visit: Payer: Medicare HMO

## 2022-11-26 ENCOUNTER — Other Ambulatory Visit: Payer: Self-pay

## 2022-11-26 DIAGNOSIS — N2581 Secondary hyperparathyroidism of renal origin: Secondary | ICD-10-CM

## 2022-11-26 MED ORDER — CINACALCET HCL 30 MG PO TABS
30.0000 mg | ORAL_TABLET | Freq: Every day | ORAL | 0 refills | Status: DC
Start: 2022-11-26 — End: 2022-12-11

## 2022-12-02 NOTE — Telephone Encounter (Signed)
Sent in 60 day supply. I suppose I seen this message afterwards.

## 2022-12-04 ENCOUNTER — Telehealth: Payer: Self-pay | Admitting: Internal Medicine

## 2022-12-04 ENCOUNTER — Other Ambulatory Visit: Payer: Self-pay | Admitting: Family Medicine

## 2022-12-04 DIAGNOSIS — E7800 Pure hypercholesterolemia, unspecified: Secondary | ICD-10-CM

## 2022-12-04 NOTE — Telephone Encounter (Signed)
Prescription Request  12/04/2022  LOV: 10/30/2022  What is the name of the medication or equipment?  amLODipine (NORVASC) 10 MG tablet   Have you contacted your pharmacy to request a refill? No   Which pharmacy would you like this sent to?  [id number] - [place], [place] - [address] [place] [place] [address] Phone: [phone] Fax: [phone]    Patient notified that their request is being sent to the clinical staff for review and that they should receive a response within 2 business days.   Please advise at Mobile [phone] (mobile)

## 2022-12-04 NOTE — Telephone Encounter (Signed)
Unable to refill per protocol, appointment needed. Another PCP listed in chart.  Requested Prescriptions  Pending Prescriptions Disp Refills   pravastatin (PRAVACHOL) 40 MG tablet [Pharmacy Med Name: Pravastatin Sodium Oral Tablet 40 MG] 90 tablet 0    Sig: TAKE ONE TABLET BY MOUTH ONE TIME DAILY     Cardiovascular:  Antilipid - Statins Failed - 12/04/2022  9:51 AM      Failed - Valid encounter within last 12 months    Recent Outpatient Visits           1 year ago Prediabetes   [facility], [place], [place]   2 years ago Hypercholesteremia   [facility] Hoy Register, MD   2 years ago Stage 4 chronic kidney disease [facility])   [facility] Hoy Register, MD   3 years ago Stage 4 chronic kidney disease [facility])   [facility] Hoy Register, MD   4 years ago Essential hypertension, benign   [facility] Hoy Register, MD       Future Appointments             In 1 week Lula Olszewski, MD [facility] [place], [place]   In 4 months Lula Olszewski, MD [facility] [place], [facility]            Failed - Lipid Panel in normal range within the last 12 months    Cholesterol, Total  Date Value Ref Range Status  06/09/2018 138 100 - 199 mg/dL Final   LDL Calculated  Date Value Ref Range Status  06/09/2018 84 0 - 99 mg/dL Final   Direct LDL  Date Value Ref Range Status  10/23/2009 97 mg/dL Final    Comment:    See lab report for associated comment(s)   HDL  Date Value Ref Range Status  06/09/2018 40 >39 mg/dL Final   Triglycerides  Date Value Ref Range Status  06/09/2018 71 0 - 149 mg/dL Final         Passed - Patient is not pregnant

## 2022-12-08 ENCOUNTER — Other Ambulatory Visit: Payer: Self-pay | Admitting: Family Medicine

## 2022-12-08 DIAGNOSIS — E7800 Pure hypercholesterolemia, unspecified: Secondary | ICD-10-CM

## 2022-12-08 DIAGNOSIS — I10 Essential (primary) hypertension: Secondary | ICD-10-CM

## 2022-12-08 NOTE — Telephone Encounter (Signed)
Patient/Patient's wife requests to be called for status of RX  request for Amlodipine

## 2022-12-11 ENCOUNTER — Telehealth: Payer: Self-pay | Admitting: Internal Medicine

## 2022-12-11 ENCOUNTER — Encounter: Payer: Self-pay | Admitting: Internal Medicine

## 2022-12-11 ENCOUNTER — Ambulatory Visit (INDEPENDENT_AMBULATORY_CARE_PROVIDER_SITE_OTHER): Payer: Medicare HMO | Admitting: Internal Medicine

## 2022-12-11 VITALS — BP 120/76 | HR 56 | Temp 98.0°F | Ht 76.0 in | Wt 209.8 lb

## 2022-12-11 DIAGNOSIS — M40209 Unspecified kyphosis, site unspecified: Secondary | ICD-10-CM

## 2022-12-11 DIAGNOSIS — N186 End stage renal disease: Secondary | ICD-10-CM

## 2022-12-11 DIAGNOSIS — Z860100 Personal history of colon polyps, unspecified: Secondary | ICD-10-CM

## 2022-12-11 DIAGNOSIS — E7800 Pure hypercholesterolemia, unspecified: Secondary | ICD-10-CM

## 2022-12-11 DIAGNOSIS — I120 Hypertensive chronic kidney disease with stage 5 chronic kidney disease or end stage renal disease: Secondary | ICD-10-CM | POA: Diagnosis not present

## 2022-12-11 DIAGNOSIS — N185 Chronic kidney disease, stage 5: Secondary | ICD-10-CM

## 2022-12-11 DIAGNOSIS — R7303 Prediabetes: Secondary | ICD-10-CM | POA: Diagnosis not present

## 2022-12-11 DIAGNOSIS — E559 Vitamin D deficiency, unspecified: Secondary | ICD-10-CM

## 2022-12-11 DIAGNOSIS — D72819 Decreased white blood cell count, unspecified: Secondary | ICD-10-CM

## 2022-12-11 DIAGNOSIS — R634 Abnormal weight loss: Secondary | ICD-10-CM

## 2022-12-11 DIAGNOSIS — E7849 Other hyperlipidemia: Secondary | ICD-10-CM

## 2022-12-11 DIAGNOSIS — N529 Male erectile dysfunction, unspecified: Secondary | ICD-10-CM

## 2022-12-11 DIAGNOSIS — N2581 Secondary hyperparathyroidism of renal origin: Secondary | ICD-10-CM

## 2022-12-11 DIAGNOSIS — I10 Essential (primary) hypertension: Secondary | ICD-10-CM

## 2022-12-11 DIAGNOSIS — D631 Anemia in chronic kidney disease: Secondary | ICD-10-CM

## 2022-12-11 DIAGNOSIS — I951 Orthostatic hypotension: Secondary | ICD-10-CM

## 2022-12-11 DIAGNOSIS — N184 Chronic kidney disease, stage 4 (severe): Secondary | ICD-10-CM

## 2022-12-11 DIAGNOSIS — N401 Enlarged prostate with lower urinary tract symptoms: Secondary | ICD-10-CM

## 2022-12-11 DIAGNOSIS — I6930 Unspecified sequelae of cerebral infarction: Secondary | ICD-10-CM

## 2022-12-11 LAB — POCT GLYCOSYLATED HEMOGLOBIN (HGB A1C): Hemoglobin A1C: 5.5 % (ref 4.0–5.6)

## 2022-12-11 MED ORDER — REPATHA SURECLICK 140 MG/ML ~~LOC~~ SOAJ: 140.0000 mg | SUBCUTANEOUS | 2 refills | Status: DC

## 2022-12-11 MED ORDER — BISOPROLOL FUMARATE 5 MG PO TABS
5.0000 mg | ORAL_TABLET | Freq: Every day | ORAL | 3 refills | Status: DC
Start: 2022-12-11 — End: 2023-12-21

## 2022-12-11 MED ORDER — CALCITRIOL 0.5 MCG PO CAPS
0.5000 ug | ORAL_CAPSULE | Freq: Every day | ORAL | 1 refills | Status: DC
Start: 2022-12-11 — End: 2023-01-02

## 2022-12-11 MED ORDER — CHOLECALCIFEROL 25 MCG (1000 UT) PO CAPS
1000.0000 [IU] | ORAL_CAPSULE | Freq: Every day | ORAL | 11 refills | Status: DC
Start: 2022-12-11 — End: 2023-05-19

## 2022-12-11 MED ORDER — AMLODIPINE BESYLATE 10 MG PO TABS
ORAL_TABLET | ORAL | 0 refills | Status: DC
Start: 2022-12-11 — End: 2022-12-11

## 2022-12-11 MED ORDER — AMLODIPINE BESYLATE 10 MG PO TABS
ORAL_TABLET | ORAL | 3 refills | Status: DC
Start: 2022-12-11 — End: 2023-12-05

## 2022-12-11 MED ORDER — PRAVASTATIN SODIUM 40 MG PO TABS
ORAL_TABLET | ORAL | 0 refills | Status: DC
Start: 2022-12-11 — End: 2023-01-02

## 2022-12-11 MED ORDER — REPATHA SURECLICK 140 MG/ML ~~LOC~~ SOAJ
140.0000 mg | SUBCUTANEOUS | 2 refills | Status: DC
Start: 2022-12-11 — End: 2023-03-27

## 2022-12-11 MED ORDER — CINACALCET HCL 30 MG PO TABS
30.0000 mg | ORAL_TABLET | Freq: Every day | ORAL | 0 refills | Status: DC
Start: 2022-12-11 — End: 2023-09-07

## 2022-12-11 NOTE — Assessment & Plan Note (Signed)
Updated problem overview for this problem to improve longitudinal management Anemia too mild to be concerned with at this time.

## 2022-12-11 NOTE — Patient Instructions (Signed)
It was a pleasure seeing you today! Your health and satisfaction are our top priorities.  Glenetta Hew, MD  Your Providers PCP: Lula Olszewski, MD,  [phone]) Referring Provider: Lula Olszewski, MD,  [phone]) Care Team Provider: Mosetta Pigeon, MD,  [phone]) Care Team Provider: Jearl Klinefelter,  [phone])  VISIT SUMMARY:  During your visit, we discussed your recent abnormal labs and the potential need for a CT scan of the groin due to your previous groin pain. However, since the pain has resolved, we decided that the CT scan is not necessary. We also discussed your kidney disease, prediabetes, and weight loss. Your kidney function is being monitored by your nephrologist, Dr. Thedore Mins, and you are a good candidate for a kidney transplant. Your cholesterol levels are slightly above the goal, but you will continue taking Repatha to manage this. We will check your A1C levels to monitor your prediabetes. You have gained weight, which is a positive development.  YOUR PLAN:  -GROIN PAIN: Your groin pain has resolved, likely due to a strain. We decided not to proceed with a CT scan due to the risk of kidney damage from the contrast dye.  -CHRONIC KIDNEY DISEASE: Your kidney function fluctuates, but you are under the care of a nephrologist. You will continue your current medications and follow-up with your kidney doctor.  -PREPARATION FOR KIDNEY TRANSPLANT: You are a good candidate for a kidney transplant. You should monitor for changes in urination, swelling, fatigue, chest pain, or shortness of breath and seek immediate medical attention if any of these symptoms occur.  -HYPERLIPIDEMIA: Your cholesterol levels are slightly above the goal. You will continue taking Repatha to manage this.  -PREDIABETES: We will check your A1C levels to monitor your prediabetes. If your A1C is over 6.5, we will schedule an earlier follow-up appointment for diabetes management. (Since it  was 5.5 your prediabetes is controlled)  -WEIGHT LOSS: You have gained weight, which is a positive development.  INSTRUCTIONS:  You should continue your current medications and follow-up with your specialists as needed. You should also check your blood work with your kidney doctor before your next visit. If you notice any changes in urination, swelling, fatigue, chest pain, or shortness of breath, seek immediate medical attention. If your A1C levels are over 6.5, we will schedule an earlier follow-up appointment for diabetes management. Your next follow-up with me is in six months unless any new issues arise.   NEXT STEPS: [x]  Early Intervention: Schedule sooner appointment, call our on-call services, or go to emergency room if there is any significant Increase in pain or discomfort New or worsening symptoms Sudden or severe changes in your health [x]  Flexible Follow-Up: We recommend a Return in about 6 months (around 06/13/2023) for chronic disease monitoring and management. for optimal routine care. This allows for progress monitoring and treatment adjustments. [x]  Preventive Care: Schedule your annual preventive care visit! It's typically covered by insurance and helps identify potential health issues early. [x]  Lab & X-ray Appointments: Incomplete tests scheduled today, or call to schedule. X-rays: [facility] (M-F, 8:30am-noon or 1pm-5pm). [x]  Medical Information Release: Sign a release form at front desk to obtain relevant medical information we don't have.  MAKING THE MOST OF OUR FOCUSED 20 MINUTE APPOINTMENTS: [x]   Clearly state your top concerns at the beginning of the visit to focus our discussion [x]   If you anticipate you will need more time, please inform the front desk during scheduling - we can  book multiple appointments in the same week. [x]   If you have transportation problems- use our convenient video appointments or ask about transportation support. [x]   We  can get down to business faster if you use MyChart to update information before the visit and submit non-urgent questions before your visit. Thank you for taking the time to provide details through MyChart.  Let our nurse know and she can import this information into your encounter documents.  Arrival and Wait Times: [x]   Arriving on time ensures that everyone receives prompt attention. [x]   Early morning (8a) and afternoon (1p) appointments tend to have shortest wait times. [x]   Unfortunately, we cannot delay appointments for late arrivals or hold slots during phone calls.  Getting Answers and Following Up [x]   Simple Questions & Concerns: For quick questions or basic follow-up after your visit, reach [place] at [phone] or MyChart messaging. [x]   Complex Concerns: If your concern is more complex, scheduling an appointment might be best. Discuss this with the staff to find the most suitable option. [x]   Lab & Imaging Results: We'll contact you directly if results are abnormal or you don't use MyChart. Most normal results will be on MyChart within 2-3 business days, with a review message from Dr. Jon Billings. Haven't heard back in 2 weeks? Need results sooner? Contact us at [phone]. [x]   Referrals: Our referral coordinator will manage specialist referrals. The specialist's office should contact you within 2 weeks to schedule an appointment. Call us if you haven't heard from them after 2 weeks.  Staying Connected [x]   MyChart: Activate your MyChart for the fastest way to access results and message [place]. See the last page of this paperwork for instructions on how to activate.  Bring to Your Next Appointment [x]   Medications: Please bring all your medication bottles to your next appointment to ensure we have an accurate record of your prescriptions. [x]   Health Diaries: If you're monitoring any health conditions at home, keeping a diary of your readings can be very helpful for discussions at your  next appointment.  Billing [x]   X-ray & Lab Orders: These are billed by separate companies. Contact the invoicing company directly for questions or concerns. [x]   Visit Charges: Discuss any billing inquiries with our administrative services team.  Your Satisfaction Matters [x]   Share Your Experience: We strive for your satisfaction! If you have any complaints, or preferably compliments, please let Dr. Jon Billings know directly or contact our Practice Administrators, Edwena Felty or [employer], by asking at the front desk.   Reviewing Your Records [x]   Review this early draft of your clinical encounter notes below and the final encounter summary tomorrow on MyChart after its been completed.  All orders placed so far are visible here: Anemia of chronic renal failure, stage 5 (HCC) Assessment & Plan: Updated problem overview for this problem to improve longitudinal management Anemia too mild to be concerned with at this time.   Erectile dysfunction, unspecified erectile dysfunction type  ESRD due to hypertension [facility]) Assessment & Plan: Discussed at length.  Gfr hovering just above dialysis range.GFR fluctuates around 17-19. He is under the care of nephrologist Dr. Thedore Mins, with no dialysis initiated yet. He will continue follow-up with the nephrologist and maintain current medications, including Sensipar and vitamin D.  Encouraged patient to let me know if Changes in urination, Swelling, Fatigue, Chest pain or shortness of breath - patient understands to seek immediate medical attention for these updated problem overview for this problem to  improve longitudinal management He is a good candidate for a kidney transplant, with no significant issues identified in the workup. He will continue monitoring for changes in urination, swelling, fatigue, chest pain, or shortness of breath and seek immediate medical attention if any of these symptoms occur.   Other hyperlipidemia Assessment & Plan: LDL  cholesterol is 84, slightly above the goal of 70 due to a history of stroke, while on Repatha. He will continue Repatha to maintain low LDL levels.  Orders: -     Repatha SureClick; Inject 140 mg into the skin every 14 (fourteen) days.  Dispense: 2 mL; Refill: 2  History of colon polyps  Leukopenia, unspecified type  Orthostatic hypotension Assessment & Plan: Sometimes when standing   Prediabetes -     POCT glycosylated hemoglobin (Hb A1C)  Weight loss  History of cerebrovascular accident (CVA) with residual deficit -     Repatha SureClick; Inject 140 mg into the skin every 14 (fourteen) days.  Dispense: 2 mL; Refill: 2  Essential hypertension, benign -     amLODIPine Besylate; TAKE ONE TABLET BY MOUTH ONE TIME DAILY  Dispense: 90 tablet; Refill: 3  Kyphosis, unspecified kyphosis type, unspecified spinal region -     Bisoprolol Fumarate; Take 1 tablet (5 mg total) by mouth daily. Replaces labetalol  Dispense: 90 tablet; Refill: 3  Stage 4 chronic kidney disease (HCC) -     Calcitriol; Take 1 capsule (0.5 mcg total) by mouth daily.  Dispense: 90 capsule; Refill: 1  Vitamin D deficiency -     Cholecalciferol; Take 1 capsule (1,000 Units total) by mouth daily.  Dispense: 100 capsule; Refill: 11  Secondary hyperparathyroidism (HCC) -     Cinacalcet HCl; Take 1 tablet (30 mg total) by mouth daily with breakfast. Takes on Mondays and Fridays.  Dispense: 60 tablet; Refill: 0  Hypercholesteremia -     Pravastatin Sodium; TAKE ONE TABLET BY MOUTH ONE TIME DAILY  Dispense: 90 tablet; Refill: 0  Benign prostatic hyperplasia with lower urinary tract symptoms, symptom details unspecified

## 2022-12-11 NOTE — Assessment & Plan Note (Signed)
Sometimes when standing

## 2022-12-11 NOTE — Progress Notes (Signed)
[facility]: [phone]   Routine Medical Office Visit  Patient:  Brent Jimenez      Age: 67 y.o.       Sex:  male  Date:   12/11/2022 Patient Care Team: Lula Olszewski, MD as PCP - General (Internal Medicine) Mosetta Pigeon, MD (Nephrology) Jearl Klinefelter (Gastroenterology) Today's Healthcare Provider: Lula Olszewski, MD   Assessment and Plan:   Brent Jimenez was seen today for discuss referral and imaging.  Anemia of chronic renal failure, stage 5 (HCC) Overview: Lab Results  Component Value Date/Time   HGB 11.9 (L) 11/07/2022 08:44 AM   HGB 11.9 (L) 09/26/2022 11:28 AM   HGB 11.4 (L) 01/06/2020 07:22 PM   HGB 11.4 (L) 06/09/2018 11:32 AM   HGB 12.9 (L) 09/14/2014 12:01 PM   HGB 13.1 06/15/2012 01:02 PM   Lab Results  Component Value Date/Time   MCV 81.7 11/07/2022 08:44 AM   MCV 80.8 09/26/2022 11:28 AM   MCV 82.1 01/06/2020 07:22 PM   MCV 81 06/09/2018 11:32 AM   MCV 80.1 09/14/2014 12:01 PM   MCV 81.5 06/15/2012 01:02 PM   No results found for: "IRON", "TIBC", "FERRITIN", "IRONPCTSAT"  No results found for: "RETICCTPCT", "RETICCTABS", "PATHREVIEW"   Assessment & Plan: Updated problem overview for this problem to improve longitudinal management Anemia too mild to be concerned with at this time.   Erectile dysfunction, unspecified erectile dysfunction type Overview: Pde5 made his head hurt  I've sent in despite above at request Saw urologist prior   ESRD due to hypertension [facility]) Overview: December 11, 2022 interim history:   patient reports urine output good.  No  current need for dialysis. He is also on Cinacalcet and Sensipar for high PTH due to kidney disease, takes vitamin D supplement . We will continue the current medications and management plan, He is not on ARB/SGLT2  because nephrologist ("would not start it at this point"). Defer to Dr. Thedore Mins.  Transplant planning is now complete but he is not yet on the list.    Nephrologist: Mosetta Pigeon, MD  per our discussion 10/2022 suspects this is most likely due to hypertensive nephrosclerosis as it has been present since 2009.  Have referred to urology for lower urinary tract symptoms.  Medicines:  ARB/ACEI:  not at this point per nephrology SGLT2 inhibitor:   not on - presumably due to kidney disease too advanced  Symptoms to Watch For: Changes in urination, Swelling, Fatigue, Chest pain or shortness of breath - patient understands to seek immediate medical attention for these Medicines to Avoid:  diuretics (fluid pills), NSAIDs, proton pump inhibitor (PPI) stomach acid reducer, contrasted imaging studies, aminoglycosides, acyclovir, phosphate-based bowel prep, baclofen  Lifestyle and Nutrition:  Limit sodium, potassium, phosphorus and protein intake, consider nutrition consult, smoking avoidance, exercise regularly, manage weight, and reduce stress, control blood sugar, avoid dehydration (may need early emergency room visits for nausea vomiting diarrhea)  Vaccinations: flu/COVID/pneumonia/hepatitis B vaccinations are more important in kidney disease Imaging: medical renal disease on ultrasound 11/2022.  Bladder emptied.  Labwork Lab Results  Component Value Date   EGFR 13 (L) 09/26/2022   CREATININE 4.67 (H) 09/26/2022   CREATININE 4.67 (H) 01/06/2020   CREATININE 3.54 (H) 06/09/2018   CREATININE 3.49 (H) 05/13/2017   CREATININE 3.44 (H) 11/07/2015   VD25OH 45.15 09/26/2022   NA 139 09/26/2022   K 4.1 09/26/2022   CL 102 09/26/2022   CO2 21 09/26/2022   CALCIUM 9.1 09/26/2022  PROT 6.4 09/26/2022   ALBUMIN 4.5 09/26/2022   HGB 11.9 (L) 09/26/2022   HCT 36.0 (L) 09/26/2022   PTH 295 (H) 09/26/2022   PSA 0.65 09/14/2014   CRP <1.0 09/26/2022   ESRSEDRATE 9 09/26/2022   HGBA1C 5.7 03/20/2021   Lab Results  Component Value Date   COLORURINE YELLOW 09/26/2022   APPEARANCEUR CLEAR 09/26/2022   LABSPEC 1.015 09/26/2022   PHURINE 5.5  09/26/2022   GLUCOSEU NEGATIVE 09/26/2022   HGBUR NEGATIVE 09/26/2022   KETONESUR TRACE (A) 09/26/2022   PROTEINUR 1+ (A) 09/26/2022   NITRITE NEGATIVE 09/26/2022   LEUKOCYTESUR NEGATIVE 09/26/2022   MICROALBUR 12.5 (H) 09/26/2022   No results found for: "GFR" Estimated Creatinine Clearance: 18.6 mL/min (A) (by C-G formula based on SCr of 4.67 mg/dL (H)). Lab Results  Component Value Date/Time   CREATININE 4.67 (H) 09/26/2022 11:28 AM   CREATININE 4.67 (H) 01/06/2020 07:22 PM   CREATININE 3.54 (H) 06/09/2018 11:32 AM   CREATININE 3.49 (H) 05/13/2017 02:40 PM   CREATININE 3.44 (H) 11/07/2015 11:19 AM   CREATININE 3.24 (H) 09/14/2014 12:01 PM   CREATININE 3.27 (H) 12/22/2013 10:07 AM   CREATININE 3.30 (H) 06/15/2012 01:02 PM     Assessment & Plan: Discussed at length.  Gfr hovering just above dialysis range.GFR fluctuates around 17-19. He is under the care of nephrologist Dr. Thedore Mins, with no dialysis initiated yet. He will continue follow-up with the nephrologist and maintain current medications, including Sensipar and vitamin D.  Encouraged patient to let me know if Changes in urination, Swelling, Fatigue, Chest pain or shortness of breath - patient understands to seek immediate medical attention for these updated problem overview for this problem to improve longitudinal management He is a good candidate for a kidney transplant, with no significant issues identified in the workup. He will continue monitoring for changes in urination, swelling, fatigue, chest pain, or shortness of breath and seek immediate medical attention if any of these symptoms occur.   Other hyperlipidemia Overview: Takes pravastatin consistently     Component Value Date/Time   CHOL 138 06/09/2018 1132   TRIG 71 06/09/2018 1132   HDL 40 06/09/2018 1132   CHOLHDL 3.5 06/09/2018 1132   CHOLHDL 3.7 11/07/2015 1119   VLDL 20 11/07/2015 1119   LDLCALC 84 06/09/2018 1132   LDLDIRECT 97 10/23/2009 2324   LABVLDL  14 06/09/2018 1132   The ASCVD Risk score (Arnett DK, et al., 2019) failed to calculate for the following reasons:   The patient has a prior MI or stroke diagnosis  Takes pravastatin long term   Assessment & Plan: LDL cholesterol is 84, slightly above the goal of 70 due to a history of stroke, while on Repatha. He will continue Repatha to maintain low LDL levels.  Orders: -     Repatha SureClick; Inject 140 mg into the skin every 14 (fourteen) days.  Dispense: 2 mL; Refill: 2  History of colon polyps Overview: Adenomatous sessile polyp removed 2020 by Dr. Adela Lank - plan repeat 2027   Leukopenia, unspecified type Overview: Potential exposure, medication use, or symptoms suggestive of underlying diseases:  chronic kidney disease Medicines evaluated for potential bone marrow suppression:  none seem likely Known liver disease/splenomegaly: None. Available labs/imaging show no problem with liver. Ultrasound abdomen was negative 2024 Peripheral smear: N/A      Latest Ref Rng & Units 11/07/2022    8:44 AM 09/26/2022   11:28 AM 01/06/2020    7:22 PM  CBC EXTENDED  WBC 4.0 - 10.5 K/uL 5.5  3.8  3.9   RBC 4.22 - 5.81 Mil/uL 4.61  4.46  4.41   Hemoglobin 13.0 - 17.0 g/dL 16.1  09.6  04.5   HCT 39.0 - 52.0 % 37.7  36.0  36.2   Platelets 150.0 - 400.0 K/uL 224.0  193.0  157   NEUT# 1.4 - 7.7 K/uL 2.9   2.9   Lymph# 0.7 - 4.0 K/uL 1.7   0.7    Lab Results  Component Value Date   ESRSEDRATE 24 (H) 11/07/2022   No results found for: "ANA" Lab Results  Component Value Date   CRP <1.0 09/26/2022   No results found for: "VITAMINB12" No results found for: "FOLATE" No results found for: "HEPBSAB" No results found for: "HEPBSAG" No results found for: "HCVAB" No results found for: "HIV1X2" Lab Results  Component Value Date   ALT 13 11/07/2022   AST 17 11/07/2022   ALKPHOS 64 11/07/2022   BILITOT 0.7 11/07/2022     Orthostatic hypotension Assessment & Plan: Sometimes when  standing   Prediabetes Overview: Lab Results  Component Value Date   HGBA1C 5.7 03/20/2021   HGBA1C 5.8 06/09/2018   HGBA1C 6.0 05/13/2017   Managed with diet and exercise only   Assessment & Plan: Last checked a few years ago, he has lost weight since then. We will check A1C today. If A1C is over 6.5, we will schedule an earlier follow-up appointment for diabetes management. Point of Care (POC) Hemoglobin A1c 5.5 today so not concerning    Orders: -     POCT glycosylated hemoglobin (Hb A1C)  Weight loss Overview:  Weight Loss: His weight has stabilized, and a recent abdominal ultrasound showed no abnormalities. No further workup is needed unless weight loss resumes. We will consider canceling the GI referral for a colonoscopy unless weight loss resumes or if there is blood in stool or stomach discomfort.  Lab Results  Component Value Date   TSH 1.12 09/26/2022   Lab Results  Component Value Date   HGBA1C 5.7 03/20/2021    Wt Readings from Last 10 Encounters:  12/11/22 209 lb 12.8 oz (95.2 kg)  11/07/22 203 lb 9.6 oz (92.4 kg)  10/30/22 204 lb 12.8 oz (92.9 kg)  09/26/22 202 lb 12.8 oz (92 kg)  09/02/22 202 lb 9.6 oz (91.9 kg)  07/28/22 206 lb 9.6 oz (93.7 kg)  03/20/21 229 lb 12.8 oz (104.2 kg)  01/06/20 230 lb (104.3 kg)  02/14/19 237 lb 12.8 oz (107.9 kg)  01/31/19 237 lb 12.8 oz (107.9 kg)     History of cerebrovascular accident (CVA) with residual deficit Overview: 2007 aneurysm rupture, was depressurized scar on left head Patient reports mild aphasia and dysphasia, comprehension difficulty since then, can't read since then, can't follow conversation beyond a few words.  Orders: -     Repatha SureClick; Inject 140 mg into the skin every 14 (fourteen) days.  Dispense: 2 mL; Refill: 2  Essential hypertension, benign -     amLODIPine Besylate; TAKE ONE TABLET BY MOUTH ONE TIME DAILY  Dispense: 90 tablet; Refill: 3  Kyphosis, unspecified kyphosis type,  unspecified spinal region -     Bisoprolol Fumarate; Take 1 tablet (5 mg total) by mouth daily. Replaces labetalol  Dispense: 90 tablet; Refill: 3  Stage 4 chronic kidney disease (HCC) -     Calcitriol; Take 1 capsule (0.5 mcg total) by mouth daily.  Dispense: 90 capsule; Refill: 1  Vitamin D  deficiency Overview: Managed by nephrology Takes 2000 daily as per nephrology Clinical symptoms that can be associated with vitamin D deficiency include bone pain, muscle weakness, and general fatigue Vitamin D levels are affected by dietary choices and sunlight exposure  Lab Results  Component Value Date   VD25OH 45.15 09/26/2022   VD25OH 40 11/07/2015   VD25OH 15 (L) 06/15/2012   Lab Results  Component Value Date   TSH 1.12 09/26/2022   ALKPHOS 59 09/26/2022   PTH 295 (H) 09/26/2022   CALCIUM 9.1 09/26/2022   PHOS 3.8 12/18/2008   Lab Results  Component Value Date   CALCIUM 9.1 09/26/2022   CALCIUM 9.1 01/06/2020   CALCIUM 9.9 06/09/2018   CALCIUM 9.1 05/13/2017   CALCIUM 9.3 11/07/2015     Orders: -     Cholecalciferol; Take 1 capsule (1,000 Units total) by mouth daily.  Dispense: 100 capsule; Refill: 11  Secondary hyperparathyroidism (HCC) Overview: GFR Suggested Monitoring  30-59 calcium and phosphate every 6-12 months. PTH every 12 months. If hyperphosphatemia or under treatment, may be assessed every three months.    15-29 calcium and phosphate every 3-6 months. PTH every 6-12 months. More frequently if abnormals.PTH a minimum of every 3-6. The frequency of testing depends on whether the patient is being treated or not.  <15 Calcium and phosphate every 1-3 months.   Vitamin D: annually, unless PTH high, vitamin D low, or on treatment then its every 6 months  Alkaline Phosphatase: Annually, or more often if there are signs of increased bone turnover or changes in PTH treatment.  Lab Results  Component Value Date   PTH 295 (H) 09/26/2022   VD25OH 45.15 09/26/2022    CALCIUM 9.1 09/26/2022   PHOS 3.8 12/18/2008   ALKPHOS 59 09/26/2022    Nephrologist: Mosetta Pigeon, MD defer to him for treatment(s).  Orders: -     Cinacalcet HCl; Take 1 tablet (30 mg total) by mouth daily with breakfast. Takes on Mondays and Fridays.  Dispense: 60 tablet; Refill: 0  Hypercholesteremia -     Pravastatin Sodium; TAKE ONE TABLET BY MOUTH ONE TIME DAILY  Dispense: 90 tablet; Refill: 0  Benign prostatic hyperplasia with lower urinary tract symptoms, symptom details unspecified Overview: Nephrology discontinued doxazosin due to lack of symptom(s), patient reports he still has orthostatic hypotension/dizziness Lab Results  Component Value Date   PSA 0.65 09/14/2014   PSA 0.65 06/15/2012   PSA 0.36 06/17/2007         He has gained weight, resolving the previous issue of weight loss. Groin pain from prior has also resolved today. Given his current stable condition, he can follow up in six months unless any new issues arise. He will continue current medications and follow-up with specialists as needed, checking blood work with the kidney doctor before the next visit.        Encounter Orders  (refills all medications, try to add Repatha due to CVA history and last Low Density Lipoprotein (LDL cholesterol) not at goal less than 70 despite pravastatin         Ordered    Evolocumab (REPATHA SURECLICK) 140 MG/ML SOAJ  Every 14 days,   Status:  Discontinued        12/11/22 1231    Evolocumab (REPATHA SURECLICK) 140 MG/ML SOAJ  Every 14 days       Note to Pharmacy: Not at Low Density Lipoprotein (LDL cholesterol) goal of 70 after CVA despite statins. Intolerant of statins other than pravastatin  12/11/22 1232    POCT HgB A1C        12/11/22 1242    amLODipine (NORVASC) 10 MG tablet  Status:  Discontinued        12/11/22 1242    amLODipine (NORVASC) 10 MG tablet  Status:  Discontinued        12/11/22 1457    bisoprolol (ZEBETA) 5 MG tablet  Daily        12/11/22  1457    calcitRIOL (ROCALTROL) 0.5 MCG capsule  Daily        12/11/22 1457    Cholecalciferol 25 MCG (1000 UT) capsule  Daily        12/11/22 1457    cinacalcet (SENSIPAR) 30 MG tablet  Daily with breakfast        12/11/22 1457    pravastatin (PRAVACHOL) 40 MG tablet        12/11/22 1457    amLODipine (NORVASC) 10 MG tablet        12/11/22 1457            Recommended follow up: Return in about 6 months (around 06/13/2023) for chronic disease monitoring and management.  Future Appointments  Date Time Provider Department Center  01/02/2023  8:30 AM Jearl Klinefelter [facility]  05/19/2023  9:00 AM Lula Olszewski, MD [facility]  09/08/2023 11:00 AM [facility] ANNUAL WELLNESS VISIT 1 [facility]           Clinical Presentation:    67 y.o. male who has Hypertension; BPH (benign prostatic hyperplasia); Prediabetes; Vitamin D deficiency; Erectile dysfunction; Hyperlipidemia; Sensorineural hearing loss (SNHL) of both ears; ESRD due to hypertension (HCC); History of cerebrovascular accident (CVA) with residual deficit; Hearing loss; Secondary hyperparathyroidism (HCC); Proteinuria; Anemia of chronic renal failure; Orthostatic hypotension; Kyphosis; History of colon polyps; Leucopenia; and Lower urinary tract symptoms (LUTS) on their problem list. His reasons/main concerns/chief complaints for today's office visit are Discuss referral and imaging (Evaluation of necessity of CT scan of groin. Review abnormal labs done at [facility] on 7/11.)   AI-Extracted: Discussed the use of AI scribe software for clinical note transcription with the patient, who gave verbal consent to proceed.  History of Present Illness   The patient, with a history of kidney disease and stroke, presented for a discussion regarding recent abnormal labs and a potential need for a CT scan of the groin. The patient had been experiencing groin pain, which was suspected to be a hernia. However, the pain has since  resolved, leading to a reconsideration of the need for the CT scan. The patient is under the care of a nephrologist for kidney disease, with recent labs showing a fluctuating GFR, currently at 17. The patient is not on dialysis and there is a potential plan for a kidney transplant in the future.  The patient also has a history of prediabetes and has been experiencing weight loss, although recent weight gain was noted. The patient's white blood cell count, previously low, has improved. The patient also reported a history of a stroke in 2007, which is believed to be related to a clog in an artery.  The patient's medications include lorapine, bisoprolol, pravastatin, dalsosin, vitamin D, and Sensipar. The patient reported a change in medication from labetalol to bisoprolol, which was associated with an improvement in kidney function and overall well-being. The patient also reported taking Repatha, a medication for cholesterol management.  The patient also had a bone density test done recently, which showed normal bone  density. The patient is taking vitamin D for this. The patient also reported a history of bodybuilding, but denied the use of steroids.        He  has a past medical history of Acute kidney failure with lesion of tubular necrosis (HCC) (08/12/2022), Anemia of chronic renal failure (08/12/2022), Bilateral impacted cerumen (03/25/2021), BPH (benign prostatic hyperplasia) (11/30/2013), Brain bleed (HCC) (05/12/2005), Chronic kidney disease, Chronic kidney disease, stage 4 (severe) (HCC) (02/03/2019), Erectile dysfunction (11/30/2013), Hearing loss (07/28/2022), History of cerebral infarction (09/09/2022), History of cerebrovascular accident (CVA) with residual deficit (02/03/2019), History of CVA (cerebrovascular accident) (02/03/2019), Hypercalcemia (02/03/2019), Hypercholesteremia, Hyperlipidemia (11/07/2015), Hypertension, Orthostatic hypotension (08/12/2022), Prediabetes (11/30/2013),  Proteinuria (08/12/2022), Secondary hyperparathyroidism, not elsewhere classified (HCC) (08/12/2022), Sensorineural hearing loss (SNHL) of both ears (11/05/2017), Stage 5 chronic kidney disease due to benign hypertension (HCC) (08/12/2022), Stroke (HCC), Vitamin D deficiency (11/30/2013), and Weight loss (09/26/2022). No current outpatient medications on file prior to visit.   No current facility-administered medications on file prior to visit.   Medications Discontinued During This Encounter  Medication Reason   Evolocumab (REPATHA SURECLICK) 140 MG/ML SOAJ    amLODipine (NORVASC) 10 MG tablet Reorder   0.9 %  sodium chloride infusion    calcitRIOL (ROCALTROL) 0.5 MCG capsule    Cholecalciferol 25 MCG (1000 UT) capsule Reorder   bisoprolol (ZEBETA) 5 MG tablet Reorder   cinacalcet (SENSIPAR) 30 MG tablet Reorder   pravastatin (PRAVACHOL) 40 MG tablet Reorder   amLODipine (NORVASC) 10 MG tablet Reorder   amLODipine (NORVASC) 10 MG tablet          Clinical Data Analysis:   Physical Exam  BP 120/76 (BP Location: Left Arm, Patient Position: Sitting)   Pulse (!) 56   Temp 98 F (36.7 C) (Temporal)   Ht 6\' 4"  (1.93 m)   Wt 209 lb 12.8 oz (95.2 kg)   SpO2 97%   BMI 25.54 kg/m  Wt Readings from Last 10 Encounters:  12/11/22 209 lb 12.8 oz (95.2 kg)  11/07/22 203 lb 9.6 oz (92.4 kg)  10/30/22 204 lb 12.8 oz (92.9 kg)  09/26/22 202 lb 12.8 oz (92 kg)  09/02/22 202 lb 9.6 oz (91.9 kg)  07/28/22 206 lb 9.6 oz (93.7 kg)  03/20/21 229 lb 12.8 oz (104.2 kg)  01/06/20 230 lb (104.3 kg)  02/14/19 237 lb 12.8 oz (107.9 kg)  01/31/19 237 lb 12.8 oz (107.9 kg)   Vital signs reviewed.  Nursing notes reviewed. Weight trend reviewed. Abnormalities and Problem-Specific physical exam findings:  mental status consistent with prior visits, very pleasant  General Appearance:  No acute distress appreciable.   Well-groomed, healthy-appearing male.  Well proportioned with no abnormal fat  distribution.  Good muscle tone. Skin: Clear and well-hydrated. Pulmonary:  Normal work of breathing at rest, no respiratory distress apparent. SpO2: 97 %  Musculoskeletal: All extremities are intact.  Neurological:  Awake, alert, oriented, and engaged.  No obvious focal neurological deficits or cognitive impairments.  Sensorium seems unclouded.   Speech is clear and coherent with logical content. Psychiatric:  Appropriate mood, pleasant and cooperative demeanor, thoughtful and engaged during the exam  Results Reviewed:      No results found for any visits on 12/11/22.  Office Visit on 11/07/2022  Component Date Value   Color, UA 11/07/2022 yellow    Clarity, UA 11/07/2022 clear    Glucose, UA 11/07/2022 Negative    Bilirubin, UA 11/07/2022 negative    Ketones, UA 11/07/2022 negative  Spec Grav, UA 11/07/2022 1.015    Blood, UA 11/07/2022 negative    pH, UA 11/07/2022 6.0    Protein, UA 11/07/2022 Positive (A)    Urobilinogen, UA 11/07/2022 0.2    Nitrite, UA 11/07/2022 negative    Leukocytes, UA 11/07/2022 Negative    WBC 11/07/2022 5.5    RBC 11/07/2022 4.61    Hemoglobin 11/07/2022 11.9 (L)    HCT 11/07/2022 37.7 (L)    MCV 11/07/2022 81.7    MCHC 11/07/2022 31.7    RDW 11/07/2022 13.6    Platelets 11/07/2022 224.0    Neutrophils Relative % 11/07/2022 53.5    Lymphocytes Relative 11/07/2022 31.6    Monocytes Relative 11/07/2022 7.5    Eosinophils Relative 11/07/2022 6.8 (H)    Basophils Relative 11/07/2022 0.6    Neutro Abs 11/07/2022 2.9    Lymphs Abs 11/07/2022 1.7    Monocytes Absolute 11/07/2022 0.4    Eosinophils Absolute 11/07/2022 0.4    Basophils Absolute 11/07/2022 0.0    Total CK 11/07/2022 214    Sodium 11/07/2022 142    Potassium 11/07/2022 4.3    Chloride 11/07/2022 105    CO2 11/07/2022 31    Glucose, Bld 11/07/2022 93    BUN 11/07/2022 35 (H)    Creatinine, Ser 11/07/2022 3.53 (H)    Total Bilirubin 11/07/2022 0.7    Alkaline Phosphatase  11/07/2022 64    AST 11/07/2022 17    ALT 11/07/2022 13    Total Protein 11/07/2022 7.1    Albumin 11/07/2022 4.5    GFR 11/07/2022 17.01 (L)    Calcium 11/07/2022 10.3    Sed Rate 11/07/2022 24 (H)   Office Visit on 09/26/2022  Component Date Value   WBC 09/26/2022 3.8 (L)    RBC 09/26/2022 4.46    Platelets 09/26/2022 193.0    Hemoglobin 09/26/2022 11.9 (L)    HCT 09/26/2022 36.0 (L)    MCV 09/26/2022 80.8    MCHC 09/26/2022 32.9    RDW 09/26/2022 13.8    Glucose 09/26/2022 90    BUN 09/26/2022 48 (H)    Creatinine, Ser 09/26/2022 4.67 (H)    eGFR 09/26/2022 13 (L)    BUN/Creatinine Ratio 09/26/2022 10    Sodium 09/26/2022 139    Potassium 09/26/2022 4.1    Chloride 09/26/2022 102    CO2 09/26/2022 21    Calcium 09/26/2022 9.1    Total Protein 09/26/2022 6.4    Albumin 09/26/2022 4.5    Globulin, Total 09/26/2022 1.9    Albumin/Globulin Ratio 09/26/2022 2.4 (H)    Bilirubin Total 09/26/2022 0.5    Alkaline Phosphatase 09/26/2022 59    AST 09/26/2022 19    ALT 09/26/2022 13    Microalb, Ur 09/26/2022 12.5 (H)    Creatinine,U 09/26/2022 198.8    Microalb Creat Ratio 09/26/2022 6.3    VITD 09/26/2022 45.15    Magnesium 09/26/2022 1.9    TSH 09/26/2022 1.12    PTH 09/26/2022 295 (H)    Color, Urine 09/26/2022 YELLOW    APPearance 09/26/2022 CLEAR    Specific Gravity, Urine 09/26/2022 1.015    pH 09/26/2022 5.5    Glucose, UA 09/26/2022 NEGATIVE    Bilirubin Urine 09/26/2022 NEGATIVE    Ketones, ur 09/26/2022 TRACE (A)    Hgb urine dipstick 09/26/2022 NEGATIVE    Protein, ur 09/26/2022 1+ (A)    Nitrite 09/26/2022 NEGATIVE    Leukocytes,Ua 09/26/2022 NEGATIVE    WBC, UA 09/26/2022 NONE SEEN    RBC /  HPF 09/26/2022 NONE SEEN    Squamous Epithelial / HPF 09/26/2022 NONE SEEN    Bacteria, UA 09/26/2022 NONE SEEN    Hyaline Cast 09/26/2022 NONE SEEN    Sed Rate 09/26/2022 9    CRP 09/26/2022 <1.0    No image results found.   DG Chest 2 View  Result Date:  11/27/2022 CLINICAL DATA:  67 year old male with history of unintentional weight loss. EXAM: CHEST - 2 VIEW COMPARISON:  No priors. FINDINGS: Lung volumes are normal. No consolidative airspace disease. No pleural effusions. No pneumothorax. No pulmonary nodule or mass noted. Pulmonary vasculature and the cardiomediastinal silhouette are within normal limits. IMPRESSION: No radiographic evidence of acute cardiopulmonary disease. Electronically Signed   By: Trudie Reed M.D.   On: 11/27/2022 12:35   DG Bone Density  Result Date: 11/21/2022 Table formatting from the original result was not included. Date of study: 11/21/2022 Exam: DUAL X-RAY ABSORPTIOMETRY (DXA) FOR BONE MINERAL DENSITY (BMD) Instrument: [employer] Requesting Provider: PCP Indication: screening for osteoporosis Comparison: none (please note that it is not possible to compare data from different instruments) Clinical data: Pt is a 67 y.o. male without previous fractures. Results:  Lumbar spine L1-L4 Femoral neck (FN) 33% distal radius T-score +1.0 RFN: - 0.9 LFN: - 0.5 +0.8 Assessment: the BMD is normal according to the [facility] A [facility] classification for osteoporosis (see below). Fracture risk: low FRAX score: not calculated due to normal BMD Comments: the technical quality of the study is good Recommend optimizing calcium (1200 mg/day) and vitamin D (800 IU/day) intake. No pharmacological treatment is indicated. Followup: Repeat BMD is appropriate after 2 years. WHO criteria for diagnosis of osteoporosis in postmenopausal women and in men 65 y/o or older: - normal: T-score -1.0 to + 1.0 - osteopenia/low bone density: T-score between -2.5 and -1.0 - osteoporosis: T-score below -2.5 - severe osteoporosis: T-score below -2.5 with history of fragility fracture Note: although not part of the WHO classification, the presence of a fragility fracture, regardless of the T-score, should be considered diagnostic of osteoporosis, provided other causes for the  fracture have been excluded. Interpreted by : Lyndle Herrlich, MD [facility]   US Abdomen Complete  Result Date: 10/28/2022 CLINICAL DATA:  Unintentional weight loss. EXAM: ABDOMEN ULTRASOUND COMPLETE COMPARISON:  Ultrasound renal 07/29/2022 FINDINGS: Gallbladder: No gallstones or wall thickening visualized. No sonographic Murphy sign noted by sonographer. Common bile duct: Diameter: 1.9 mL Liver: No focal lesion identified. Within normal limits in parenchymal echogenicity. Portal vein is patent on color Doppler imaging with normal direction of blood flow towards the liver. IVC: No abnormality visualized. Pancreas: Visualized portion unremarkable. Spleen: Size and appearance within normal limits. Right Kidney: Length: 10.1 cm. Increased cortical echogenicity. Normal cortical thickness. No hydronephrosis or mass. Left Kidney: Length: 10.8 cm. Increased cortical echogenicity. Normal cortical thickness. No hydronephrosis or mass. Abdominal aorta: No aneurysm visualized. Other findings: None. IMPRESSION: 1. No cholelithiasis or sonographic evidence for acute cholecystitis. 2. Increased cortical echogenicity within the bilateral kidneys suggestive of medical renal disease. Electronically Signed   By: Annia Belt M.D.   On: 10/28/2022 09:46    DG Chest 2 View  Result Date: 11/27/2022 CLINICAL DATA:  67 year old male with history of unintentional weight loss. EXAM: CHEST - 2 VIEW COMPARISON:  No priors. FINDINGS: Lung volumes are normal. No consolidative airspace disease. No pleural effusions. No pneumothorax. No pulmonary nodule or mass noted. Pulmonary vasculature and the cardiomediastinal silhouette are within normal limits. IMPRESSION: No radiographic evidence of  acute cardiopulmonary disease. Electronically Signed   By: Trudie Reed M.D.   On: 11/27/2022 12:35   DG Bone Density  Result Date: 11/21/2022 Table formatting from the original result was not included. Date of study:  11/21/2022 Exam: DUAL X-RAY ABSORPTIOMETRY (DXA) FOR BONE MINERAL DENSITY (BMD) Instrument: [employer] Requesting Provider: PCP Indication: screening for osteoporosis Comparison: none (please note that it is not possible to compare data from different instruments) Clinical data: Pt is a 67 y.o. male without previous fractures. Results:  Lumbar spine L1-L4 Femoral neck (FN) 33% distal radius T-score +1.0 RFN: - 0.9 LFN: - 0.5 +0.8 Assessment: the BMD is normal according to the [facility] classification for osteoporosis (see below). Fracture risk: low FRAX score: not calculated due to normal BMD Comments: the technical quality of the study is good Recommend optimizing calcium (1200 mg/day) and vitamin D (800 IU/day) intake. No pharmacological treatment is indicated. Followup: Repeat BMD is appropriate after 2 years. WHO criteria for diagnosis of osteoporosis in postmenopausal women and in men 62 y/o or older: - normal: T-score -1.0 to + 1.0 - osteopenia/low bone density: T-score between -2.5 and -1.0 - osteoporosis: T-score below -2.5 - severe osteoporosis: T-score below -2.5 with history of fragility fracture Note: although not part of the WHO classification, the presence of a fragility fracture, regardless of the T-score, should be considered diagnostic of osteoporosis, provided other causes for the fracture have been excluded. Interpreted by : Lyndle Herrlich, MD [facility]      This encounter employed real-time, collaborative documentation. The patient actively reviewed and updated their medical record on a shared screen, ensuring transparency and facilitating joint problem-solving for the problem list, overview, and plan. This approach promotes accurate, informed care. The treatment plan was discussed and reviewed in detail, including medication safety, potential side effects, and all patient questions. We confirmed understanding and comfort with the plan. Follow-up instructions were  established, including contacting the office for any concerns, returning if symptoms worsen, persist, or new symptoms develop, and precautions for potential emergency department visits. ----------------------------------------------------- Lula Olszewski, MD  12/11/2022 8:00 PM   Health Care at [facility]:  [phone]

## 2022-12-11 NOTE — Assessment & Plan Note (Signed)
LDL cholesterol is 84, slightly above the goal of 70 due to a history of stroke, while on Repatha. He will continue Repatha to maintain low LDL levels.

## 2022-12-11 NOTE — Telephone Encounter (Signed)
Pts wife states pharmacy needs a PA for  Evolocumab (REPATHA SURECLICK) 140 MG/ML SOAJ  Please advise.

## 2022-12-11 NOTE — Assessment & Plan Note (Signed)
Discussed at length.  Gfr hovering just above dialysis range.GFR fluctuates around 17-19. He is under the care of nephrologist Dr. Thedore Mins, with no dialysis initiated yet. He will continue follow-up with the nephrologist and maintain current medications, including Sensipar and vitamin D.  Encouraged patient to let me know if Changes in urination, Swelling, Fatigue, Chest pain or shortness of breath - patient understands to seek immediate medical attention for these updated problem overview for this problem to improve longitudinal management He is a good candidate for a kidney transplant, with no significant issues identified in the workup. He will continue monitoring for changes in urination, swelling, fatigue, chest pain, or shortness of breath and seek immediate medical attention if any of these symptoms occur.

## 2022-12-11 NOTE — Assessment & Plan Note (Addendum)
Last checked a few years ago, he has lost weight since then. We will check A1C today. If A1C is over 6.5, we will schedule an earlier follow-up appointment for diabetes management. Point of Care (POC) Hemoglobin A1c 5.5 today so not concerning

## 2022-12-12 ENCOUNTER — Other Ambulatory Visit (HOSPITAL_COMMUNITY): Payer: Self-pay

## 2022-12-12 ENCOUNTER — Telehealth: Payer: Self-pay

## 2022-12-12 NOTE — Telephone Encounter (Signed)
PA request has been Submitted. New Encounter created for follow up. For additional info see Pharmacy Prior Auth telephone encounter from 12/12/22.

## 2022-12-12 NOTE — Telephone Encounter (Signed)
Pharmacy Patient Advocate Encounter   Received notification from Pt Calls Messages that prior authorization for Repatha SureClick 140MG /ML auto-injectors is required/requested.   Insurance verification completed.   The patient is insured through [place] .   Per test claim: PA required; PA submitted to [facility] via CoverMyMeds Key/confirmation #/E[facility] Status is pending

## 2022-12-12 NOTE — Telephone Encounter (Signed)
PA approved. Patient and wife notified.

## 2022-12-12 NOTE — Telephone Encounter (Signed)
Pharmacy Patient Advocate Encounter  Received notification from [facility] that Prior Authorization for Repatha SureClick 140MG /ML auto-injectors has been APPROVED from 05/12/22 to 05/12/23. Ran test claim, Copay is $11.20  PA #/Case ID/Reference #: [id number]

## 2022-12-12 NOTE — Telephone Encounter (Signed)
Patient and wife notified and verbalized understanding.  

## 2022-12-16 NOTE — Telephone Encounter (Signed)
Refill was sent in on 8/1.

## 2022-12-28 DIAGNOSIS — N189 Chronic kidney disease, unspecified: Secondary | ICD-10-CM | POA: Insufficient documentation

## 2023-01-02 ENCOUNTER — Encounter: Payer: Self-pay | Admitting: Gastroenterology

## 2023-01-02 ENCOUNTER — Ambulatory Visit: Payer: Medicare HMO | Admitting: Gastroenterology

## 2023-01-02 VITALS — BP 110/80 | HR 56 | Ht 74.5 in | Wt 208.2 lb

## 2023-01-02 DIAGNOSIS — Z86010 Personal history of colonic polyps: Secondary | ICD-10-CM | POA: Diagnosis not present

## 2023-01-02 DIAGNOSIS — I120 Hypertensive chronic kidney disease with stage 5 chronic kidney disease or end stage renal disease: Secondary | ICD-10-CM

## 2023-01-02 DIAGNOSIS — N186 End stage renal disease: Secondary | ICD-10-CM

## 2023-01-02 NOTE — Progress Notes (Signed)
Chief Complaint: Kidney Transplant pre evaluation Primary GI MD: Dr. Adela Lank  HPI: 67 year old male history of ESRD (not on HD), anemia, hearing loss, CVA with residual aphasia (not on anticoagulation), anemia, presents to discuss an evaluation prior to his kidney transplant.  Spouse provides history  Lab work 12/22/2022 - Hgb 11.6, MCV 82 - Creatinine 3.45, BUN 32, GFR 19  US abdomen complete done for weight loss 10/28/2022 showed no cholelithiasis.  Normal liver.  Patient's wife states he had an unintentional weight loss of 15 pounds a few months ago.  He then switched from labetalol to bisoprolol which improved his kidney function and his appetite and he has gained the weight back.  Patient denies melena, hematochezia.  Denies abdominal pain, nausea, vomiting.  No GI symptoms.  They are here today for preop evaluation prior to kidney transplantation.  Patient is up-to-date on his colonoscopy and is not due until 2027 and is having no symptoms at this time.   PREVIOUS GI WORKUP   Colonoscopy 02/2019 for screening - One 3 mm tubular adenoma in the ascending colon, removed with a cold snare. Resected and retrieved.  - Diverticulosis in the sigmoid colon.  - The examination was otherwise normal.  Past Medical History:  Diagnosis Date   Acute kidney failure with lesion of tubular necrosis (HCC) 08/12/2022   Anemia of chronic renal failure 08/12/2022   Bilateral impacted cerumen 03/25/2021   BPH (benign prostatic hyperplasia) 11/30/2013   Nephrology discontinued doxazosin due to lack of symptom(s)         Lab Results  Component  Value  Date     PSA  0.65  09/14/2014     PSA  0.65  06/15/2012     PSA  0.36  06/17/2007                  Brain bleed (HCC) 05/12/2005   expressive asphasia   Chronic kidney disease    Chronic kidney disease, stage 4 (severe) (HCC) 02/03/2019   No results found for: "GFR"  CrCl cannot be calculated (Patient's most recent lab result is older than the  maximum 21 days allowed.).      Lab Results  Component  Value  Date/Time     CREATININE  4.67 (H)  01/06/2020 07:22 PM     CREATININE  3.54 (H)  06/09/2018 11:32 AM     CREATININE  3.49 (H)  05/13/2017 02:40 PM     CREATININE  3.44 (H)  11/07/2015 11:19 AM     CREATININE  3.24 (H)  09/14/2014   Erectile dysfunction 11/30/2013   Pde5 made his head hurt  Saw urologist- who tried Cialis but he never tried  I discussed that I think it might work for him, wife asked I call in, I sent in low dose to minimize headache risk... But advised patient close blood pressure monitoring due to kidneys      Hearing loss 07/28/2022   History of cerebral infarction 09/09/2022   History of cerebrovascular accident (CVA) with residual deficit 02/03/2019   2007 aneurysm rupture, was depressurized scar on left head  Patient reports mild aphasia and dysphasia, comprehension difficulty since then, can't read since then, can't follow conversation beyond a few words.   History of CVA (cerebrovascular accident) 02/03/2019   Hypercalcemia 02/03/2019   Lab Results  Component  Value  Date     CALCIUM  9.1  01/06/2020     PHOS  3.8  12/18/2008  Newer labs from nephrology who is managing   Hypercholesteremia    Hyperlipidemia 11/07/2015   Lipid Panel          Component  Value  Date/Time     CHOL  138  06/09/2018 1132     TRIG  71  06/09/2018 1132     HDL  40  06/09/2018 1132     CHOLHDL  3.5  06/09/2018 1132     CHOLHDL  3.7  11/07/2015 1119     VLDL  20  11/07/2015 1119     LDLCALC  84  06/09/2018 1132     LDLDIRECT  97  10/23/2009 2324     LABVLDL  14  06/09/2018 1132     The ASCVD Risk score (Arnett DK, et al., 2019) failed to c   Hypertension    Orthostatic hypotension 08/12/2022   Prediabetes 11/30/2013   Lab Results  Component  Value  Date     HGBA1C  5.7  03/20/2021     HGBA1C  5.8  06/09/2018     HGBA1C  6.0  05/13/2017   Managed with diet and exercise only      Proteinuria 08/12/2022   Secondary hyperparathyroidism, not  elsewhere classified (HCC) 08/12/2022   Sensorineural hearing loss (SNHL) of both ears 11/05/2017   Unconfirmed reported by wife   Stage 5 chronic kidney disease due to benign hypertension (HCC) 08/12/2022   Stroke (HCC)    Vitamin D deficiency 11/30/2013   Managed by nephrology  Takes 1000 vitamin D as directed   Weight loss 09/26/2022      Weight Loss: His weight has stabilized, and a recent abdominal ultrasound showed no abnormalities. No further workup is needed unless weight loss resumes. We will consider canceling the GI referral for a colonoscopy unless weight loss resumes or if there is blood in stool or stomach discomfort.             Lab Results      Component    Value    Date           TSH    1.12    09/26/2022              Past Surgical History:  Procedure Laterality Date   BRAIN SURGERY  2007    Current Outpatient Medications  Medication Sig Dispense Refill   amLODipine (NORVASC) 10 MG tablet TAKE ONE TABLET BY MOUTH ONE TIME DAILY 90 tablet 3   bisoprolol (ZEBETA) 5 MG tablet Take 1 tablet (5 mg total) by mouth daily. Replaces labetalol 90 tablet 3   calcitRIOL (ROCALTROL) 0.5 MCG capsule Take 1 capsule (0.5 mcg total) by mouth daily. 90 capsule 1   Cholecalciferol 25 MCG (1000 UT) capsule Take 1 capsule (1,000 Units total) by mouth daily. 100 capsule 11   cinacalcet (SENSIPAR) 30 MG tablet Take 1 tablet (30 mg total) by mouth daily with breakfast. Takes on Mondays and Fridays. 60 tablet 0   Evolocumab (REPATHA SURECLICK) 140 MG/ML SOAJ Inject 140 mg into the skin every 14 (fourteen) days. 2 mL 2   pravastatin (PRAVACHOL) 40 MG tablet TAKE ONE TABLET BY MOUTH ONE TIME DAILY 90 tablet 0   No current facility-administered medications for this visit.    Allergies as of 01/02/2023   (No Known Allergies)    Family History  Problem Relation Age of Onset   Heart disease Mother    Stroke Father 75       died  of stroke   Hypertension Father    Colon cancer Neg Hx    Colon  polyps Neg Hx    Esophageal cancer Neg Hx    Stomach cancer Neg Hx    Rectal cancer Neg Hx     Social History   Socioeconomic History   Marital status: Married    Spouse name: Not on file   Number of children: Not on file   Years of education: Not on file   Highest education level: 12th grade  Occupational History   Not on file  Tobacco Use   Smoking status: Never   Smokeless tobacco: Never  Vaping Use   Vaping status: Never Used  Substance and Sexual Activity   Alcohol use: No   Drug use: No   Sexual activity: Not on file  Other Topics Concern   Not on file  Social History Narrative   Not on file   Social Determinants of Health   Financial Resource Strain: Low Risk  (09/26/2022)   Overall Financial Resource Strain (CARDIA)    Difficulty of Paying Living Expenses: Not hard at all  Food Insecurity: No Food Insecurity (09/26/2022)   Hunger Vital Sign    Worried About Running Out of Food in the Last Year: Never true    Ran Out of Food in the Last Year: Never true  Transportation Needs: No Transportation Needs (09/26/2022)   PRAPARE - Administrator, Civil Service (Medical): No    Lack of Transportation (Non-Medical): No  Physical Activity: Inactive (09/26/2022)   Exercise Vital Sign    Days of Exercise per Week: 0 days    Minutes of Exercise per Session: 40 min  Stress: No Stress Concern Present (09/26/2022)   [employer] of Occupational Health - Occupational Stress Questionnaire    Feeling of Stress : Not at all  Social Connections: Moderately Integrated (09/26/2022)   Social Connection and Isolation Panel [NHANES]    Frequency of Communication with Friends and Family: Three times a week    Frequency of Social Gatherings with Friends and Family: Once a week    Attends Religious Services: 1 to 4 times per year    Active Member of [employer] or Organizations: No    Attends Banker Meetings: Never    Marital Status: Married  Catering manager  Violence: Not At Risk (09/02/2022)   Humiliation, Afraid, Rape, and Kick questionnaire    Fear of Current or Ex-Partner: No    Emotionally Abused: No    Physically Abused: No    Sexually Abused: No    Review of Systems:    Constitutional: No weight loss, fever, chills, weakness or fatigue HEENT: Eyes: No change in vision               Ears, Nose, Throat:  No change in hearing or congestion Skin: No rash or itching Cardiovascular: No chest pain, chest pressure or palpitations   Respiratory: No SOB or cough Gastrointestinal: See HPI and otherwise negative Genitourinary: No dysuria or change in urinary frequency Neurological: No headache, dizziness or syncope Musculoskeletal: No new muscle or joint pain Hematologic: No bleeding or bruising Psychiatric: No history of depression or anxiety    Physical Exam:  Vital signs: There were no vitals taken for this visit.  Constitutional: NAD, Well developed, Well nourished, alert and cooperative Head:  Normocephalic and atraumatic. Eyes:   PEERL, EOMI. No icterus. Conjunctiva pink. Respiratory: Respirations even and unlabored. Lungs clear to auscultation bilaterally.  No wheezes, crackles, or rhonchi.  Cardiovascular:  Regular rate and rhythm. No peripheral edema, cyanosis or pallor.  Gastrointestinal:  Soft, nondistended, nontender. No rebound or guarding. Normal bowel sounds. No appreciable masses or hepatomegaly. Rectal:  Not performed.  Msk:  Symmetrical without gross deformities. Without edema, no deformity or joint abnormality.  Neurologic:  Alert and  oriented x4;  grossly normal neurologically.  Skin:   Dry and intact without significant lesions or rashes. Psychiatric: Oriented to person, place and time. Demonstrates good judgement and reason without abnormal affect or behaviors.   RELEVANT LABS AND IMAGING: CBC    Component Value Date/Time   WBC 5.5 11/07/2022 0844   RBC 4.61 11/07/2022 0844   HGB 11.9 (L) 11/07/2022 0844    HGB 11.4 (L) 06/09/2018 1132   HCT 37.7 (L) 11/07/2022 0844   HCT 35.0 (L) 06/09/2018 1132   PLT 224.0 11/07/2022 0844   PLT 255 06/09/2018 1132   MCV 81.7 11/07/2022 0844   MCV 81 06/09/2018 1132   MCH 25.9 (L) 01/06/2020 1922   MCHC 31.7 11/07/2022 0844   RDW 13.6 11/07/2022 0844   RDW 13.5 06/09/2018 1132   LYMPHSABS 1.7 11/07/2022 0844   LYMPHSABS 1.3 06/09/2018 1132   MONOABS 0.4 11/07/2022 0844   EOSABS 0.4 11/07/2022 0844   EOSABS 0.4 06/09/2018 1132   BASOSABS 0.0 11/07/2022 0844   BASOSABS 0.0 06/09/2018 1132    CMP     Component Value Date/Time   NA 142 11/07/2022 0844   NA 139 09/26/2022 1128   K 4.3 11/07/2022 0844   CL 105 11/07/2022 0844   CO2 31 11/07/2022 0844   GLUCOSE 93 11/07/2022 0844   BUN 35 (H) 11/07/2022 0844   BUN 48 (H) 09/26/2022 1128   CREATININE 3.53 (H) 11/07/2022 0844   CREATININE 3.44 (H) 11/07/2015 1119   CALCIUM 10.3 11/07/2022 0844   CALCIUM 9.7 06/20/2008 2050   PROT 7.1 11/07/2022 0844   PROT 6.4 09/26/2022 1128   ALBUMIN 4.5 11/07/2022 0844   ALBUMIN 4.5 09/26/2022 1128   AST 17 11/07/2022 0844   ALT 13 11/07/2022 0844   ALKPHOS 64 11/07/2022 0844   BILITOT 0.7 11/07/2022 0844   BILITOT 0.5 09/26/2022 1128   GFRNONAA 12 (L) 01/06/2020 1922   GFRNONAA 18 (L) 11/07/2015 1119   GFRAA 14 (L) 01/06/2020 1922   GFRAA 21 (L) 11/07/2015 1119     Assessment/Plan:   ESRD due to hypertension (HCC) History of colon polyps Patient has no GI issues at this time and is not due for colonoscopy until 02/2026.  Discussed that since he is up-to-date on his colonoscopy he likely does not need to do it early in absence of symptoms.  However, if kidney transplant team requests early colonoscopy prior to procedure we are happy to do it. Reviewed notes in care everywhere. call us back if it is required.  Boone Master, PA-C [facility] 01/02/2023, 8:19 AM  Cc: Lula Olszewski, MD

## 2023-01-05 NOTE — Progress Notes (Signed)
Agree with assessment and plan as outlined.  

## 2023-01-28 ENCOUNTER — Ambulatory Visit: Payer: Medicare HMO | Admitting: Internal Medicine

## 2023-02-03 DIAGNOSIS — H40013 Open angle with borderline findings, low risk, bilateral: Secondary | ICD-10-CM | POA: Diagnosis not present

## 2023-02-03 DIAGNOSIS — H53461 Homonymous bilateral field defects, right side: Secondary | ICD-10-CM | POA: Diagnosis not present

## 2023-02-03 DIAGNOSIS — H2513 Age-related nuclear cataract, bilateral: Secondary | ICD-10-CM | POA: Diagnosis not present

## 2023-02-03 DIAGNOSIS — I639 Cerebral infarction, unspecified: Secondary | ICD-10-CM | POA: Diagnosis not present

## 2023-03-27 ENCOUNTER — Telehealth: Payer: Self-pay | Admitting: Internal Medicine

## 2023-03-27 ENCOUNTER — Other Ambulatory Visit: Payer: Self-pay

## 2023-03-27 DIAGNOSIS — E7849 Other hyperlipidemia: Secondary | ICD-10-CM

## 2023-03-27 DIAGNOSIS — I6930 Unspecified sequelae of cerebral infarction: Secondary | ICD-10-CM

## 2023-03-27 MED ORDER — REPATHA SURECLICK 140 MG/ML ~~LOC~~ SOAJ
140.0000 mg | SUBCUTANEOUS | 2 refills | Status: DC
Start: 2023-03-27 — End: 2023-06-29

## 2023-03-27 NOTE — Telephone Encounter (Signed)
Sent refill to requested pharmacy

## 2023-03-27 NOTE — Telephone Encounter (Signed)
Prescription Request  03/27/2023  LOV: 12/11/2022  What is the name of the medication or equipment?  Evolocumab (REPATHA SURECLICK) 140 MG/ML SOAJ    Have you contacted your pharmacy to request a refill? Yes   Which pharmacy would you like this sent to?  [id number] - [place], [place] - [address] [place] [address] Phone: [phone] Fax: [phone]    Patient notified that their request is being sent to the clinical staff for review and that they should receive a response within 2 business days.   Please advise at Mobile [phone] (mobile)

## 2023-04-01 DIAGNOSIS — E210 Primary hyperparathyroidism: Secondary | ICD-10-CM | POA: Diagnosis not present

## 2023-04-01 DIAGNOSIS — R809 Proteinuria, unspecified: Secondary | ICD-10-CM | POA: Diagnosis not present

## 2023-04-01 DIAGNOSIS — N184 Chronic kidney disease, stage 4 (severe): Secondary | ICD-10-CM | POA: Diagnosis not present

## 2023-04-01 DIAGNOSIS — I129 Hypertensive chronic kidney disease with stage 1 through stage 4 chronic kidney disease, or unspecified chronic kidney disease: Secondary | ICD-10-CM | POA: Diagnosis not present

## 2023-04-01 DIAGNOSIS — D631 Anemia in chronic kidney disease: Secondary | ICD-10-CM | POA: Diagnosis not present

## 2023-04-06 DIAGNOSIS — I10 Essential (primary) hypertension: Secondary | ICD-10-CM | POA: Diagnosis not present

## 2023-04-06 DIAGNOSIS — D631 Anemia in chronic kidney disease: Secondary | ICD-10-CM | POA: Diagnosis not present

## 2023-04-06 DIAGNOSIS — E210 Primary hyperparathyroidism: Secondary | ICD-10-CM | POA: Diagnosis not present

## 2023-04-06 DIAGNOSIS — I129 Hypertensive chronic kidney disease with stage 1 through stage 4 chronic kidney disease, or unspecified chronic kidney disease: Secondary | ICD-10-CM | POA: Diagnosis not present

## 2023-04-06 DIAGNOSIS — N184 Chronic kidney disease, stage 4 (severe): Secondary | ICD-10-CM | POA: Diagnosis not present

## 2023-04-06 DIAGNOSIS — R809 Proteinuria, unspecified: Secondary | ICD-10-CM | POA: Diagnosis not present

## 2023-05-01 ENCOUNTER — Encounter: Payer: Medicare HMO | Admitting: Internal Medicine

## 2023-05-19 ENCOUNTER — Encounter: Payer: Self-pay | Admitting: Internal Medicine

## 2023-05-19 ENCOUNTER — Ambulatory Visit (INDEPENDENT_AMBULATORY_CARE_PROVIDER_SITE_OTHER): Payer: Medicare HMO | Admitting: Internal Medicine

## 2023-05-19 VITALS — BP 126/88 | HR 64 | Temp 97.0°F | Ht 74.5 in | Wt 208.6 lb

## 2023-05-19 DIAGNOSIS — J328 Other chronic sinusitis: Secondary | ICD-10-CM

## 2023-05-19 DIAGNOSIS — K029 Dental caries, unspecified: Secondary | ICD-10-CM | POA: Diagnosis not present

## 2023-05-19 MED ORDER — PSEUDOEPHEDRINE HCL ER 120 MG PO TB12: 120.0000 mg | ORAL_TABLET | Freq: Two times a day (BID) | ORAL | 0 refills | Status: DC

## 2023-05-19 MED ORDER — LORATADINE 10 MG PO TABS: 10.0000 mg | ORAL_TABLET | Freq: Every day | ORAL | 11 refills | Status: DC

## 2023-05-19 MED ORDER — FLUTICASONE PROPIONATE 50 MCG/ACT NA SUSP: 2.0000 | Freq: Every day | NASAL | 6 refills | Status: DC

## 2023-05-19 MED ORDER — SIMPLY SALINE 0.9 % NA AERS: 2.0000 | INHALATION_SPRAY | NASAL | 11 refills | Status: DC

## 2023-05-19 MED ORDER — AMOXICILLIN-POT CLAVULANATE 875-125 MG PO TABS: 1.0000 | ORAL_TABLET | Freq: Two times a day (BID) | ORAL | 0 refills | Status: DC

## 2023-05-19 NOTE — Patient Instructions (Signed)
 VISIT SUMMARY:  During today's visit, we discussed your worsening cough, sinus congestion, and other related symptoms. We also reviewed your dental and dermatological concerns. A treatment plan was established to address each of these issues.  YOUR PLAN:  -CHRONIC COUGH: Your cough, which has been deep and intermittent for two weeks, is likely due to postnasal drip from a sinus infection, possibly worsened by smoke exposure and weather changes. We will prescribe Augmentin  and recommend over-the-counter medications like Robitussin Complete or DayQuil/NyQuil for relief. Additionally, use Simply Saline nasal spray followed by Flonase , and consider antihistamines like Claritin , Benadryl , or Xyzal. Sudafed can also help dry up secretions.  -SINUSITIS: Sinusitis is an inflammation of the sinuses causing congestion and postnasal drip. We will prescribe Augmentin  and recommend using Simply Saline nasal spray followed by Flonase . Antihistamines like Claritin , Benadryl , or Xyzal, and Sudafed can help manage your symptoms.  -DENTAL CARIES: A dental caries is a cavity in your tooth. You have a severe cavity on your top left tooth that needs a crown and possibly dentures or a partial in the future. Please follow up with your dentist for crown placement.  -GENERAL HEALTH MAINTENANCE: We discussed the importance of having medications like Claritin  on hand for unexpected flare-ups. Keeping Claritin  in your medicine cabinet is advisable.  INSTRUCTIONS:  Please follow up with your dermatologist as scheduled.

## 2023-05-19 NOTE — Progress Notes (Signed)
 ==============================  [facility]: [phone]   -- Medical Office Visit --  Patient: Brent Jimenez      Age: 68 y.o.       Sex:  male  Date:   05/19/2023 Today's Healthcare Provider: Bernardino KANDICE Cone, MD  ==============================   CHIEF COMPLAINT: Annual Exam and Cough  We chose to focus on cough and put off annual exam to next visit.   SUBJECTIVE: 68 y.o. male who has Hypertension; BPH (benign prostatic hyperplasia); Prediabetes; Vitamin D  deficiency; Erectile dysfunction; Hyperlipidemia; Sensorineural hearing loss (SNHL) of both ears; ESRD due to hypertension (HCC); History of cerebrovascular accident (CVA) with residual deficit; Hearing loss; Secondary hyperparathyroidism (HCC); Proteinuria; Anemia of chronic renal failure; Orthostatic hypotension; Kyphosis; History of colon polyps; Leucopenia; and Lower urinary tract symptoms (LUTS) on their problem list.  History of Present Illness The patient, with a history of chronic sinusitis, presents with a worsening cough of two weeks duration. The cough is described as deep and intermittent, sometimes leading to sneezing. The patient denies any production of mucus or phlegm with the cough. There is no associated fever, chest pain, or shortness of breath. The patient reports increased nasal congestion and frequent nose blowing, suggesting a possible postnasal drip. The patient also notes a sensation of food getting stuck in the throat, particularly with certain foods like sausage, which triggers a coughing fit that resolves with water intake.  The patient works part-time in a smoky environment, but reports the cough is worse at home. The patient's grandkids, who had been staying with him for twelve days, had a cough and sneeze, and the patient's symptoms started after this exposure. The patient also mentions a change in weather as a possible contributing factor to the cough.  The patient has  been using a nasal spray, not specified, for sinus congestion. The patient also takes Seropatha, and wonders if this could be contributing to the symptoms. The patient has not started any new medications recently. The patient also reports a long-standing issue with a sensation in the throat, which he believes is related to food intake.  The patient has a history of dental issues, with a recent visit to the dentist and a noted cavity in the top left tooth. The patient also mentions a dermatological issue with recurring spots on the knuckles of both hands. The patient is due to see a dermatologist for this issue.  Note that patient  has a past medical history of Acute kidney failure with lesion of tubular necrosis (HCC) (08/12/2022), Anemia of chronic renal failure (08/12/2022), Bilateral impacted cerumen (03/25/2021), BPH (benign prostatic hyperplasia) (11/30/2013), Brain bleed (HCC) (05/12/2005), Chronic kidney disease, Chronic kidney disease, stage 4 (severe) (HCC) (02/03/2019), Erectile dysfunction (11/30/2013), Hearing loss (07/28/2022), History of cerebral infarction (09/09/2022), History of cerebrovascular accident (CVA) with residual deficit (02/03/2019), History of CVA (cerebrovascular accident) (02/03/2019), Hypercalcemia (02/03/2019), Hypercholesteremia, Hyperlipidemia (11/07/2015), Hypertension, Orthostatic hypotension (08/12/2022), Prediabetes (11/30/2013), Proteinuria (08/12/2022), Secondary hyperparathyroidism, not elsewhere classified (HCC) (08/12/2022), Sensorineural hearing loss (SNHL) of both ears (11/05/2017), Stage 5 chronic kidney disease due to benign hypertension (HCC) (08/12/2022), Stroke (HCC), Vitamin D  deficiency (11/30/2013), and Weight loss (09/26/2022).  Problem list overviews that were updated at today's visit:No problems updated.  Med reconciliation: Current Outpatient Medications on File Prior to Visit  Medication Sig   amLODipine  (NORVASC ) 10 MG tablet TAKE ONE TABLET BY  MOUTH ONE TIME DAILY   bisoprolol  (ZEBETA ) 5 MG tablet Take 1 tablet (5 mg total) by mouth daily.  Replaces labetalol    Cholecalciferol  50 MCG (2000 UT) TABS Take by mouth.   cinacalcet  (SENSIPAR ) 30 MG tablet Take 1 tablet (30 mg total) by mouth daily with breakfast. Takes on Mondays and Fridays.   Evolocumab  (REPATHA  SURECLICK) 140 MG/ML SOAJ Inject 140 mg into the skin every 14 (fourteen) days.   No current facility-administered medications on file prior to visit.   Medications Discontinued During This Encounter  Medication Reason   Cholecalciferol  25 MCG (1000 UT) capsule       Objective   Physical Exam     05/19/2023    9:05 AM 05/19/2023    8:53 AM 01/02/2023    8:28 AM  Vitals with BMI  Height  6' 2.5 6' 2.5  Weight  208 lbs 10 oz 208 lbs 4 oz  BMI  26.43 26.39  Systolic 126 146 889  Diastolic 88 98 80  Pulse  64 56   Wt Readings from Last 10 Encounters:  05/19/23 208 lb 9.6 oz (94.6 kg)  01/02/23 208 lb 4 oz (94.5 kg)  12/11/22 209 lb 12.8 oz (95.2 kg)  11/07/22 203 lb 9.6 oz (92.4 kg)  10/30/22 204 lb 12.8 oz (92.9 kg)  09/26/22 202 lb 12.8 oz (92 kg)  09/02/22 202 lb 9.6 oz (91.9 kg)  07/28/22 206 lb 9.6 oz (93.7 kg)  03/20/21 229 lb 12.8 oz (104.2 kg)  01/06/20 230 lb (104.3 kg)   Vital signs reviewed.  Nursing notes reviewed. Weight trend reviewed. Abnormalities and Problem-Specific physical exam findings:  Physical Exam HEENT: External auditory canals clear, no pus or other abnormalities observed in the sinuses, bad cavity on upper left tooth. Thick sniffles and frequent severe coughing spell CHEST: Lungs clear to auscultation.  General Appearance:  No acute distress appreciable.   Well-groomed, healthy-appearing male.  Well proportioned with no abnormal fat distribution.  Good muscle tone. Pulmonary:  Normal work of breathing at rest, no respiratory distress apparent. SpO2: 99 %  Musculoskeletal: All extremities are intact.  Neurological:  Awake, alert,  oriented, and engaged.  No obvious focal neurological deficits or cognitive impairments.  Sensorium seems unclouded.   Speech is clear and coherent with logical content. Psychiatric:  Appropriate mood, pleasant and cooperative demeanor, thoughtful and engaged during the exam    No results found for any visits on 05/19/23. Office Visit on 12/11/2022  Component Date Value   Hemoglobin A1C 12/11/2022 5.5   Office Visit on 11/07/2022  Component Date Value   Color, UA 11/07/2022 yellow    Clarity, UA 11/07/2022 clear    Glucose, UA 11/07/2022 Negative    Bilirubin, UA 11/07/2022 negative    Ketones, UA 11/07/2022 negative    Spec Grav, UA 11/07/2022 1.015    Blood, UA 11/07/2022 negative    pH, UA 11/07/2022 6.0    Protein, UA 11/07/2022 Positive (A)    Urobilinogen, UA 11/07/2022 0.2    Nitrite, UA 11/07/2022 negative    Leukocytes, UA 11/07/2022 Negative    WBC 11/07/2022 5.5    RBC 11/07/2022 4.61    Hemoglobin 11/07/2022 11.9 (L)    HCT 11/07/2022 37.7 (L)    MCV 11/07/2022 81.7    MCHC 11/07/2022 31.7    RDW 11/07/2022 13.6    Platelets 11/07/2022 224.0    Neutrophils Relative % 11/07/2022 53.5    Lymphocytes Relative 11/07/2022 31.6    Monocytes Relative 11/07/2022 7.5    Eosinophils Relative 11/07/2022 6.8 (H)    Basophils Relative 11/07/2022 0.6    Neutro  Abs 11/07/2022 2.9    Lymphs Abs 11/07/2022 1.7    Monocytes Absolute 11/07/2022 0.4    Eosinophils Absolute 11/07/2022 0.4    Basophils Absolute 11/07/2022 0.0    Total CK 11/07/2022 214    Sodium 11/07/2022 142    Potassium 11/07/2022 4.3    Chloride 11/07/2022 105    CO2 11/07/2022 31    Glucose, Bld 11/07/2022 93    BUN 11/07/2022 35 (H)    Creatinine, Ser 11/07/2022 3.53 (H)    Total Bilirubin 11/07/2022 0.7    Alkaline Phosphatase 11/07/2022 64    AST 11/07/2022 17    ALT 11/07/2022 13    Total Protein 11/07/2022 7.1    Albumin 11/07/2022 4.5    GFR 11/07/2022 17.01 (L)    Calcium 11/07/2022 10.3     Sed Rate 11/07/2022 24 (H)   Office Visit on 09/26/2022  Component Date Value   WBC 09/26/2022 3.8 (L)    RBC 09/26/2022 4.46    Platelets 09/26/2022 193.0    Hemoglobin 09/26/2022 11.9 (L)    HCT 09/26/2022 36.0 (L)    MCV 09/26/2022 80.8    MCHC 09/26/2022 32.9    RDW 09/26/2022 13.8    Glucose 09/26/2022 90    BUN 09/26/2022 48 (H)    Creatinine, Ser 09/26/2022 4.67 (H)    eGFR 09/26/2022 13 (L)    BUN/Creatinine Ratio 09/26/2022 10    Sodium 09/26/2022 139    Potassium 09/26/2022 4.1    Chloride 09/26/2022 102    CO2 09/26/2022 21    Calcium 09/26/2022 9.1    Total Protein 09/26/2022 6.4    Albumin 09/26/2022 4.5    Globulin, Total 09/26/2022 1.9    Albumin/Globulin Ratio 09/26/2022 2.4 (H)    Bilirubin Total 09/26/2022 0.5    Alkaline Phosphatase 09/26/2022 59    AST 09/26/2022 19    ALT 09/26/2022 13    Microalb, Ur 09/26/2022 12.5 (H)    Creatinine,U 09/26/2022 198.8    Microalb Creat Ratio 09/26/2022 6.3    VITD 09/26/2022 45.15    Magnesium 09/26/2022 1.9    TSH 09/26/2022 1.12    PTH 09/26/2022 295 (H)    Color, Urine 09/26/2022 YELLOW    APPearance 09/26/2022 CLEAR    Specific Gravity, Urine 09/26/2022 1.015    pH 09/26/2022 5.5    Glucose, UA 09/26/2022 NEGATIVE    Bilirubin Urine 09/26/2022 NEGATIVE    Ketones, ur 09/26/2022 TRACE (A)    Hgb urine dipstick 09/26/2022 NEGATIVE    Protein, ur 09/26/2022 1+ (A)    Nitrite 09/26/2022 NEGATIVE    Leukocytes,Ua 09/26/2022 NEGATIVE    WBC, UA 09/26/2022 NONE SEEN    RBC / HPF 09/26/2022 NONE SEEN    Squamous Epithelial / HPF 09/26/2022 NONE SEEN    Bacteria, UA 09/26/2022 NONE SEEN    Hyaline Cast 09/26/2022 NONE SEEN    Sed Rate 09/26/2022 9    CRP 09/26/2022 <1.0   No image results found. No results found.DG Chest 2 View Result Date: 11/27/2022 CLINICAL DATA:  68 year old male with history of unintentional weight loss. EXAM: CHEST - 2 VIEW COMPARISON:  No priors. FINDINGS: Lung volumes are normal. No  consolidative airspace disease. No pleural effusions. No pneumothorax. No pulmonary nodule or mass noted. Pulmonary vasculature and the cardiomediastinal silhouette are within normal limits. IMPRESSION: No radiographic evidence of acute cardiopulmonary disease. Electronically Signed   By: Toribio Aye M.D.   On: 11/27/2022 12:35   DG Bone Density Result Date: 11/21/2022  Table formatting from the original result was not included. Date of study: 11/21/2022 Exam: DUAL X-RAY ABSORPTIOMETRY (DXA) FOR BONE MINERAL DENSITY (BMD) Instrument: [employer] Requesting Provider: PCP Indication: screening for osteoporosis Comparison: none (please note that it is not possible to compare data from different instruments) Clinical data: Pt is a 68 y.o. male without previous fractures. Results:  Lumbar spine L1-L4 Femoral neck (FN) 33% distal radius T-score +1.0 RFN: - 0.9 LFN: - 0.5 +0.8 Assessment: the BMD is normal according to the [facility] classification for osteoporosis (see below). Fracture risk: low FRAX score: not calculated due to normal BMD Comments: the technical quality of the study is good Recommend optimizing calcium (1200 mg/day) and vitamin D  (800 IU/day) intake. No pharmacological treatment is indicated. Followup: Repeat BMD is appropriate after 2 years. WHO criteria for diagnosis of osteoporosis in postmenopausal women and in men 57 y/o or older: - normal: T-score -1.0 to + 1.0 - osteopenia/low bone density: T-score between -2.5 and -1.0 - osteoporosis: T-score below -2.5 - severe osteoporosis: T-score below -2.5 with history of fragility fracture Note: although not part of the WHO classification, the presence of a fragility fracture, regardless of the T-score, should be considered diagnostic of osteoporosis, provided other causes for the fracture have been excluded. Interpreted by : Stefano Redgie Butts, MD [facility]       Assessment & Plan Other chronic sinusitis Chronic Cough   He  has experienced an intermittent deep cough for two weeks, accompanied by sneezing and chest congestion, without fever, dyspnea, or chest pain. The cough likely stems from postnasal drip secondary to a sinus infection, with potential exacerbation from environmental factors such as smoke exposure at work and recent weather changes. Considering the differential diagnoses of viral upper respiratory infection and bacterial sinusitis, we discussed the 50% efficacy of antibiotics in resolving symptoms and the possibility of a persistent viral infection. We will prescribe Augmentin  and recommend over-the-counter Robitussin Complete or DayQuil/NyQuil for symptomatic relief. We advise using Simply Saline nasal spray followed by Flonase  and suggest antihistamines like Claritin , Benadryl , or Xyzal. Additionally, we recommend Sudafed to dry up secretions for optimal results. Dental caries Dental Caries   A severe cavity was noted on the top left tooth, necessitating a crown and potentially dentures or a partial in the future. We advise a follow-up with a dentist for crown placement.     Orders Placed During this Encounter:  No orders of the defined types were placed in this encounter.  No orders of the defined types were placed in this encounter.   General Health Maintenance   We discussed the importance of having medications like Claritin  on hand for unexpected flare-ups and advise keeping Claritin  in the medicine cabinet.  Follow-up   We advise a follow-up with a dermatologist as scheduled.    This document was synthesized by artificial intelligence ([employer]) using HIPAA-compliant recording of the clinical interaction;   We discussed the use of AI scribe software for clinical note transcription with the patient, who gave verbal consent to proceed.    Additional Info: This encounter employed state-of-the-art, real-time, collaborative documentation. The patient actively reviewed and assisted in updating their  electronic medical record on a shared screen, ensuring transparency and facilitating joint problem-solving for the problem list, overview, and plan. This approach promotes accurate, informed care. The treatment plan was discussed and reviewed in detail, including medication safety, potential side effects, and all patient questions. We confirmed understanding and comfort with the plan. Follow-up instructions were  established, including contacting the office for any concerns, returning if symptoms worsen, persist, or new symptoms develop, and precautions for potential emergency department visits.

## 2023-05-20 DIAGNOSIS — I639 Cerebral infarction, unspecified: Secondary | ICD-10-CM | POA: Diagnosis not present

## 2023-05-20 DIAGNOSIS — H53461 Homonymous bilateral field defects, right side: Secondary | ICD-10-CM | POA: Diagnosis not present

## 2023-05-20 DIAGNOSIS — H2513 Age-related nuclear cataract, bilateral: Secondary | ICD-10-CM | POA: Diagnosis not present

## 2023-05-20 DIAGNOSIS — H40013 Open angle with borderline findings, low risk, bilateral: Secondary | ICD-10-CM | POA: Diagnosis not present

## 2023-06-03 DIAGNOSIS — N184 Chronic kidney disease, stage 4 (severe): Secondary | ICD-10-CM | POA: Diagnosis not present

## 2023-06-03 DIAGNOSIS — I10 Essential (primary) hypertension: Secondary | ICD-10-CM | POA: Diagnosis not present

## 2023-06-03 DIAGNOSIS — I129 Hypertensive chronic kidney disease with stage 1 through stage 4 chronic kidney disease, or unspecified chronic kidney disease: Secondary | ICD-10-CM | POA: Diagnosis not present

## 2023-06-03 DIAGNOSIS — D631 Anemia in chronic kidney disease: Secondary | ICD-10-CM | POA: Diagnosis not present

## 2023-06-03 DIAGNOSIS — R809 Proteinuria, unspecified: Secondary | ICD-10-CM | POA: Diagnosis not present

## 2023-06-03 DIAGNOSIS — E210 Primary hyperparathyroidism: Secondary | ICD-10-CM | POA: Diagnosis not present

## 2023-06-09 DIAGNOSIS — I129 Hypertensive chronic kidney disease with stage 1 through stage 4 chronic kidney disease, or unspecified chronic kidney disease: Secondary | ICD-10-CM | POA: Diagnosis not present

## 2023-06-09 DIAGNOSIS — E210 Primary hyperparathyroidism: Secondary | ICD-10-CM | POA: Diagnosis not present

## 2023-06-09 DIAGNOSIS — N184 Chronic kidney disease, stage 4 (severe): Secondary | ICD-10-CM | POA: Diagnosis not present

## 2023-06-09 DIAGNOSIS — D631 Anemia in chronic kidney disease: Secondary | ICD-10-CM | POA: Diagnosis not present

## 2023-06-09 DIAGNOSIS — R809 Proteinuria, unspecified: Secondary | ICD-10-CM | POA: Diagnosis not present

## 2023-06-15 ENCOUNTER — Ambulatory Visit: Payer: Medicare HMO | Admitting: Internal Medicine

## 2023-06-15 ENCOUNTER — Encounter: Payer: Self-pay | Admitting: Internal Medicine

## 2023-06-15 VITALS — BP 138/91 | Temp 97.4°F | Ht 74.5 in | Wt 200.6 lb

## 2023-06-15 DIAGNOSIS — R067 Sneezing: Secondary | ICD-10-CM

## 2023-06-15 DIAGNOSIS — R0989 Other specified symptoms and signs involving the circulatory and respiratory systems: Secondary | ICD-10-CM | POA: Diagnosis not present

## 2023-06-15 DIAGNOSIS — J329 Chronic sinusitis, unspecified: Secondary | ICD-10-CM | POA: Diagnosis not present

## 2023-06-15 DIAGNOSIS — I120 Hypertensive chronic kidney disease with stage 5 chronic kidney disease or end stage renal disease: Secondary | ICD-10-CM | POA: Diagnosis not present

## 2023-06-15 DIAGNOSIS — J154 Pneumonia due to other streptococci: Secondary | ICD-10-CM | POA: Diagnosis not present

## 2023-06-15 DIAGNOSIS — R6889 Other general symptoms and signs: Secondary | ICD-10-CM

## 2023-06-15 DIAGNOSIS — J029 Acute pharyngitis, unspecified: Secondary | ICD-10-CM | POA: Diagnosis not present

## 2023-06-15 DIAGNOSIS — J020 Streptococcal pharyngitis: Secondary | ICD-10-CM | POA: Diagnosis not present

## 2023-06-15 DIAGNOSIS — R051 Acute cough: Secondary | ICD-10-CM

## 2023-06-15 DIAGNOSIS — B953 Streptococcus pneumoniae as the cause of diseases classified elsewhere: Secondary | ICD-10-CM

## 2023-06-15 DIAGNOSIS — N186 End stage renal disease: Secondary | ICD-10-CM

## 2023-06-15 LAB — POCT RAPID STREP A (OFFICE): Rapid Strep A Screen: POSITIVE — AB

## 2023-06-15 LAB — POCT INFLUENZA A/B
Influenza A, POC: NEGATIVE
Influenza B, POC: NEGATIVE

## 2023-06-15 LAB — POC COVID19 BINAXNOW: SARS Coronavirus 2 Ag: NEGATIVE

## 2023-06-15 MED ORDER — FLUTICASONE PROPIONATE 50 MCG/ACT NA SUSP: 2.0000 | Freq: Every day | NASAL | 6 refills | Status: DC

## 2023-06-15 MED ORDER — LORATADINE 10 MG PO TABS: 10.0000 mg | ORAL_TABLET | Freq: Every day | ORAL | 11 refills | Status: DC

## 2023-06-15 MED ORDER — AMOXICILLIN 500 MG PO CAPS: 500.0000 mg | ORAL_CAPSULE | Freq: Two times a day (BID) | ORAL | 0 refills | Status: AC

## 2023-06-15 MED ORDER — SIMPLY SALINE 0.9 % NA AERS: 2.0000 | INHALATION_SPRAY | NASAL | 11 refills | Status: DC

## 2023-06-15 NOTE — Patient Instructions (Signed)
VISIT SUMMARY:  You came in today with symptoms of an upper respiratory infection and possible strep throat, which started about four to five days ago. You have a runny nose, frequent coughing, and significant throat pain. You also have a history of sinus infections and chronic kidney disease, which we discussed during your visit.  YOUR PLAN:  -STREPTOCOCCAL PHARYNGITIS WITH COMPLICATIONS: Streptococcal pharyngitis, commonly known as strep throat, is a bacterial infection that causes severe throat pain and other symptoms. Your positive strep test confirms this diagnosis. We discussed the importance of completing your antibiotic course to prevent complications. You are prescribed amoxicillin 500 mg every 12 hours for 10 days. Use sinus rinses and saline spray to help with your symptoms. Rest and take at least three days off work. If your symptoms do not improve, we may consider a chest x-ray. Monitor for worsening symptoms like difficulty breathing or high fever, and visit the ER if these occur.  -CHRONIC KIDNEY DISEASE: Chronic kidney disease is a long-term condition where the kidneys do not work as well as they should. Your recent creatinine level indicates a decline in kidney function. We have adjusted your antibiotic dosage to consider your kidney function. You are prescribed amoxicillin 500 mg every 12 hours, with the option to reduce to 250 mg if your kidney function worsens. We will monitor your kidney function with blood work and discuss with your nephrologist about the frequency of monitoring. Stay hydrated and avoid substances that can harm your kidneys.  -GENERAL HEALTH MAINTENANCE: You are up to date on most vaccinations but have not received the flu shot this year. It is important to get vaccinated, especially with chronic conditions like kidney disease, to prevent infections. We recommend getting your flu shot if you haven't already and ensuring all other vaccinations are up to  date.  INSTRUCTIONS:  Please schedule a follow-up appointment within a week if your symptoms are not improving. We will also reschedule your annual physical to at least three months out and review your lab results at that time.  It was a pleasure seeing you today! Your health and satisfaction are our top priorities.   Glenetta Hew, MD  Next Steps:  [x]  Flexible Follow-Up: We recommend a follow up with your primary care, a specialist, or me within 1-2 weeks for ensuring your problem resolves. This allows for progress monitoring and treatment adjustments. [x]  Early Intervention: Schedule sooner appointment, call our on-call services, or go to emergency room if there is Increase in pain or discomfort New or worsening symptoms Sudden or severe changes in your health [x]  Lab & X-ray Appointments: complete or schedule to complete today, or call to schedule.  X-rays: [facility] (M-F, 8:30am-noon or 1pm-5pm).  Making the Most of Our Focused (20 minute) Follow Up Appointments:  [x]   Clearly state your top concerns at the beginning of the visit to focus our discussion [x]   If you anticipate you will need more time, please inform the front desk during scheduling - we can book multiple appointments in the same week. [x]   If you have transportation problems- use our convenient video appointments or ask about transportation support. [x]   We can get down to business faster if you use MyChart to update information before the visit and submit non-urgent questions before your visit. Thank you for taking the time to provide details through MyChart.  Let our nurse know and she can import this information into your encounter documents.  Arrival and Wait Times: [x]   Arriving on time ensures that everyone receives prompt attention. [x]   Early morning (8a) and afternoon (1p) appointments tend to have shortest wait times. [x]   Unfortunately, we cannot delay appointments for late arrivals or  hold slots during phone calls.  Getting Answers and Following Up  [x]   Simple Questions & Concerns: For quick questions or basic follow-up after your visit, reach [place] at [phone] or MyChart messaging. [x]   Complex Concerns: If your concern is more complex, scheduling an appointment might be best. Discuss this with the staff to find the most suitable option. [x]   Lab & Imaging Results: We'll contact you directly if results are abnormal or you don't use MyChart. Most normal results will be on MyChart within 2-3 business days, with a review message from Dr. Jon Billings. Haven't heard back in 2 weeks? Need results sooner? Contact us at [phone]. [x]   Referrals: Our referral coordinator will manage specialist referrals. The specialist's office should contact you within 2 weeks to schedule an appointment. Call us if you haven't heard from them after 2 weeks.  Staying Connected  [x]   MyChart: Activate your MyChart for the fastest way to access results and message [place]. See the last page of this paperwork for instructions on how to activate.  Bring to Your Next Appointment  [x]   Medications: Please bring all your medication bottles to your next appointment to ensure we have an accurate record of your prescriptions. [x]   Health Diaries: If you're monitoring any health conditions at home, keeping a diary of your readings can be very helpful for discussions at your next appointment.  Billing  [x]   X-ray & Lab Orders: These are billed by separate companies. Contact the invoicing company directly for questions or concerns. [x]   Visit Charges: Discuss any billing inquiries with our administrative services team.  Your Satisfaction Matters  [x]   Share Your Experience: We strive for your satisfaction! If you have any complaints, or preferably compliments, please let Dr. Jon Billings know directly or contact our Practice Administrators, Edwena Felty or [employer], by asking at the front desk.    Reviewing Your Records  [x]   Review this early draft of your clinical encounter notes below and the final encounter summary tomorrow on MyChart after its been completed.   Strep pharyngitis -     Amoxicillin; Take 1 capsule (500 mg total) by mouth 2 (two) times daily for 10 days.  Dispense: 20 capsule; Refill: 0  Flu-like symptoms -     POCT Influenza A/B  Acute cough -     POC COVID-19 BinaxNow  Runny nose -     POC COVID-19 BinaxNow  Sneezing -     POC COVID-19 BinaxNow -     Fluticasone Propionate; Place 2 sprays into both nostrils daily.  Dispense: 16 g; Refill: 6 -     Simply Saline; Place 2 each into the nose as directed. Use nightly for sinus hygiene long-term.  Can also be used as many times daily as desired to assist with clearing congested sinuses.  Dispense: 127 mL; Refill: 11 -     Loratadine; Take 1 tablet (10 mg total) by mouth daily.  Dispense: 90 tablet; Refill: 11  Chest congestion -     POC COVID-19 BinaxNow  Sore throat -     POCT rapid strep A  Streptococcal pneumonia (HCC) -     Amoxicillin; Take 1 capsule (500 mg total) by mouth 2 (two) times daily for 10 days.  Dispense: 20 capsule; Refill: 0  Sinusitis due to Streptococcus pneumoniae -     Amoxicillin; Take 1 capsule (500 mg total) by mouth 2 (two) times daily for 10 days.  Dispense: 20 capsule; Refill: 0  ESRD due to hypertension (HCC)
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
## 2023-06-22 ENCOUNTER — Ambulatory Visit: Payer: Medicare HMO | Admitting: Internal Medicine

## 2023-06-29 ENCOUNTER — Other Ambulatory Visit: Payer: Self-pay | Admitting: Internal Medicine

## 2023-06-29 DIAGNOSIS — E7849 Other hyperlipidemia: Secondary | ICD-10-CM

## 2023-06-29 DIAGNOSIS — I6930 Unspecified sequelae of cerebral infarction: Secondary | ICD-10-CM

## 2023-07-31 ENCOUNTER — Other Ambulatory Visit: Payer: Self-pay | Admitting: Family Medicine

## 2023-07-31 DIAGNOSIS — I6930 Unspecified sequelae of cerebral infarction: Secondary | ICD-10-CM

## 2023-07-31 DIAGNOSIS — E7849 Other hyperlipidemia: Secondary | ICD-10-CM

## 2023-08-01 ENCOUNTER — Other Ambulatory Visit: Payer: Self-pay | Admitting: Internal Medicine

## 2023-08-01 DIAGNOSIS — E7849 Other hyperlipidemia: Secondary | ICD-10-CM

## 2023-08-01 DIAGNOSIS — J328 Other chronic sinusitis: Secondary | ICD-10-CM

## 2023-08-01 DIAGNOSIS — I6930 Unspecified sequelae of cerebral infarction: Secondary | ICD-10-CM

## 2023-08-03 ENCOUNTER — Other Ambulatory Visit: Payer: Self-pay

## 2023-08-03 ENCOUNTER — Encounter: Payer: Self-pay | Admitting: Family

## 2023-08-03 ENCOUNTER — Ambulatory Visit: Admitting: Family

## 2023-08-03 VITALS — BP 144/81 | HR 68 | Temp 97.7°F | Ht 74.5 in | Wt 209.6 lb

## 2023-08-03 DIAGNOSIS — I6930 Unspecified sequelae of cerebral infarction: Secondary | ICD-10-CM

## 2023-08-03 DIAGNOSIS — H1032 Unspecified acute conjunctivitis, left eye: Secondary | ICD-10-CM

## 2023-08-03 DIAGNOSIS — J029 Acute pharyngitis, unspecified: Secondary | ICD-10-CM

## 2023-08-03 DIAGNOSIS — J301 Allergic rhinitis due to pollen: Secondary | ICD-10-CM

## 2023-08-03 DIAGNOSIS — E7849 Other hyperlipidemia: Secondary | ICD-10-CM

## 2023-08-03 LAB — POCT RAPID STREP A (OFFICE): Rapid Strep A Screen: NEGATIVE

## 2023-08-03 MED ORDER — FLUTICASONE PROPIONATE 50 MCG/ACT NA SUSP: NASAL | 1 refills | Status: DC

## 2023-08-03 MED ORDER — POLYMYXIN B-TRIMETHOPRIM 10000-0.1 UNIT/ML-% OP SOLN: 1.0000 [drp] | OPHTHALMIC | 0 refills | Status: DC

## 2023-08-03 MED ORDER — REPATHA SURECLICK 140 MG/ML ~~LOC~~ SOAJ: 140.0000 mg | SUBCUTANEOUS | 0 refills | Status: DC
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
## 2023-08-03 NOTE — Patient Instructions (Addendum)
 It was very nice to see you today!   Start using the Flonase nasal spray that was sent to your pharmacy by Dr. Jon Billings. Start with 1 squirt each nostril twice a day for 3 days, then reduce to daily and then every other day as long as symptoms are gone, but stay on this through the spring allergy season, usually until the middle of May.  I have sent over antibiotic eye drops to use in your left eye.  Stop taking the Dayquil, ok to take Nyquil if it helps you sleep. Ok to continue the Claritin daily. Ok to take Tylenol extra strength 3 times daily for headache. Drink at least 2 liters = 8 cups of 8oz water every day.        PLEASE NOTE:  If you had any lab tests please let us know if you have not heard back within a few days. You may see your results on MyChart before we have a chance to review them but we will give you a call once they are reviewed by [place]. If we ordered any referrals today, please let us know if you have not heard from their office within the next week.

## 2023-08-04 MED ORDER — REPATHA SURECLICK 140 MG/ML ~~LOC~~ SOAJ: 140.0000 mg | SUBCUTANEOUS | 0 refills | Status: DC

## 2023-08-04 MED ORDER — AMOXICILLIN-POT CLAVULANATE 875-125 MG PO TABS: 1.0000 | ORAL_TABLET | Freq: Two times a day (BID) | ORAL | 0 refills | Status: DC

## 2023-08-04 MED ORDER — PSEUDOEPHEDRINE HCL ER 120 MG PO TB12: 120.0000 mg | ORAL_TABLET | Freq: Two times a day (BID) | ORAL | 0 refills | Status: DC

## 2023-08-05 ENCOUNTER — Ambulatory Visit: Admitting: Internal Medicine

## 2023-08-06 MED ORDER — FLUTICASONE PROPIONATE 50 MCG/ACT NA SUSP: NASAL | 1 refills | Status: AC

## 2023-08-10 ENCOUNTER — Ambulatory Visit: Admitting: Internal Medicine

## 2023-09-07 ENCOUNTER — Other Ambulatory Visit: Payer: Self-pay | Admitting: Internal Medicine

## 2023-09-07 DIAGNOSIS — N2581 Secondary hyperparathyroidism of renal origin: Secondary | ICD-10-CM

## 2023-09-08 ENCOUNTER — Encounter: Payer: Self-pay | Admitting: Internal Medicine

## 2023-09-08 ENCOUNTER — Telehealth: Payer: Self-pay

## 2023-09-08 ENCOUNTER — Ambulatory Visit (INDEPENDENT_AMBULATORY_CARE_PROVIDER_SITE_OTHER): Payer: Medicare HMO

## 2023-09-08 ENCOUNTER — Ambulatory Visit (INDEPENDENT_AMBULATORY_CARE_PROVIDER_SITE_OTHER): Admitting: Internal Medicine

## 2023-09-08 VITALS — BP 136/70 | HR 64 | Temp 98.6°F | Ht 74.5 in | Wt 207.0 lb

## 2023-09-08 VITALS — BP 136/70 | HR 61 | Temp 97.8°F | Ht 74.5 in | Wt 207.0 lb

## 2023-09-08 DIAGNOSIS — I10 Essential (primary) hypertension: Secondary | ICD-10-CM | POA: Diagnosis not present

## 2023-09-08 DIAGNOSIS — N186 End stage renal disease: Secondary | ICD-10-CM

## 2023-09-08 DIAGNOSIS — E7849 Other hyperlipidemia: Secondary | ICD-10-CM | POA: Diagnosis not present

## 2023-09-08 DIAGNOSIS — H903 Sensorineural hearing loss, bilateral: Secondary | ICD-10-CM

## 2023-09-08 DIAGNOSIS — N2581 Secondary hyperparathyroidism of renal origin: Secondary | ICD-10-CM | POA: Diagnosis not present

## 2023-09-08 DIAGNOSIS — R809 Proteinuria, unspecified: Secondary | ICD-10-CM | POA: Diagnosis not present

## 2023-09-08 DIAGNOSIS — D72819 Decreased white blood cell count, unspecified: Secondary | ICD-10-CM

## 2023-09-08 DIAGNOSIS — E559 Vitamin D deficiency, unspecified: Secondary | ICD-10-CM | POA: Diagnosis not present

## 2023-09-08 DIAGNOSIS — N401 Enlarged prostate with lower urinary tract symptoms: Secondary | ICD-10-CM | POA: Diagnosis not present

## 2023-09-08 DIAGNOSIS — N529 Male erectile dysfunction, unspecified: Secondary | ICD-10-CM | POA: Diagnosis not present

## 2023-09-08 DIAGNOSIS — Z0000 Encounter for general adult medical examination without abnormal findings: Secondary | ICD-10-CM | POA: Diagnosis not present

## 2023-09-08 DIAGNOSIS — E790 Hyperuricemia without signs of inflammatory arthritis and tophaceous disease: Secondary | ICD-10-CM

## 2023-09-08 DIAGNOSIS — I951 Orthostatic hypotension: Secondary | ICD-10-CM

## 2023-09-08 DIAGNOSIS — I120 Hypertensive chronic kidney disease with stage 5 chronic kidney disease or end stage renal disease: Secondary | ICD-10-CM | POA: Diagnosis not present

## 2023-09-08 LAB — PROTIME-INR
INR: 1.1 ratio — ABNORMAL HIGH (ref 0.8–1.0)
Prothrombin Time: 11.2 s (ref 9.6–13.1)

## 2023-09-08 LAB — COMPREHENSIVE METABOLIC PANEL WITH GFR
ALT: 12 U/L (ref 0–53)
AST: 18 U/L (ref 0–37)
Albumin: 4.3 g/dL (ref 3.5–5.2)
Alkaline Phosphatase: 61 U/L (ref 39–117)
BUN: 42 mg/dL — ABNORMAL HIGH (ref 6–23)
CO2: 28 meq/L (ref 19–32)
Calcium: 9.3 mg/dL (ref 8.4–10.5)
Chloride: 104 meq/L (ref 96–112)
Creatinine, Ser: 4.04 mg/dL — ABNORMAL HIGH (ref 0.40–1.50)
GFR: 14.38 mL/min — CL (ref >60.00)
Glucose, Bld: 84 mg/dL (ref 70–99)
Potassium: 3.8 meq/L (ref 3.5–5.1)
Sodium: 140 meq/L (ref 135–145)
Total Bilirubin: 0.8 mg/dL (ref 0.2–1.2)
Total Protein: 7 g/dL (ref 6.0–8.3)

## 2023-09-08 LAB — VITAMIN D 25 HYDROXY (VIT D DEFICIENCY, FRACTURES): VITD: 55.26 ng/mL (ref 30.00–100.00)

## 2023-09-08 LAB — PSA: PSA: 0.99 ng/mL (ref 0.10–4.00)

## 2023-09-08 LAB — MAGNESIUM: Magnesium: 2.1 mg/dL (ref 1.5–2.5)

## 2023-09-08 LAB — LIPID PANEL
Cholesterol: 141 mg/dL (ref 0–200)
HDL: 51.7 mg/dL (ref >39.00)
LDL Cholesterol: 72 mg/dL (ref 0–99)
NonHDL: 89.46
Total CHOL/HDL Ratio: 3
Triglycerides: 85 mg/dL (ref 0.0–149.0)
VLDL: 17 mg/dL (ref 0.0–40.0)

## 2023-09-08 LAB — HEMOGLOBIN A1C: Hgb A1c MFr Bld: 5.9 % (ref 4.6–6.5)

## 2023-09-08 LAB — URIC ACID: Uric Acid, Serum: 8.8 mg/dL — ABNORMAL HIGH (ref 4.0–7.8)

## 2023-09-08 MED ORDER — TADALAFIL 20 MG PO TABS: 10.0000 mg | ORAL_TABLET | ORAL | 11 refills | Status: DC | PRN

## 2023-09-08 NOTE — Assessment & Plan Note (Signed)
 Previously resolved, cause unclear

## 2023-09-08 NOTE — Assessment & Plan Note (Signed)
 Continue(s) with sensipar , will monitor calcium and vitamin d 
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
## 2023-09-08 NOTE — Telephone Encounter (Signed)
 Not a true critical. Lab Results  Component Value Date   GFR 14.38 (LL) 09/08/2023   GFR 17.01 (L) 11/07/2022   EGFR 13 (L) 09/26/2022   No need to do anything

## 2023-09-08 NOTE — Patient Instructions (Addendum)
 VISIT SUMMARY:  Today, you came in for a follow-up on your kidney health and medication management. We reviewed your history of advanced kidney issues, borderline diabetes, orthostatic hypotension, low white blood cell count, colon polyps, mild hearing loss, and an enlarged prostate. We discussed your current medications and made some adjustments to your treatment plan.  YOUR PLAN:  -END-STAGE RENAL DISEASE DUE TO HYPERTENSION: End-stage renal disease is the final stage of chronic kidney disease where the kidneys can no longer function on their own. Your kidney function has been stable since 2007, and you have chosen conservative management over dialysis or transplant unless an emergency arises. We will set up a new nephrology referral and continue your current medications: Cinacalcet  (Sensipar ) 30 mg orally on Mondays and Fridays and Evolocumab  (Repatha ) 140 mg/ML subcutaneously every 14 days. We will also order comprehensive lab work to monitor your condition.  -HYPERTENSION: Hypertension is high blood pressure, which can damage your kidneys over time. Your blood pressure is currently borderline at 136/70 mmHg. It is important to keep it low to protect your kidneys. Continue taking Amlodipine  10 mg orally daily and Bisoprolol  5 mg orally daily. Please monitor your blood pressure at home for more accurate readings.  -ORTHOSTATIC HYPOTENSION: Orthostatic hypotension is a form of low blood pressure that happens when you stand up from sitting or lying down, causing dizziness. You are managing this by standing up slowly, which is effective. Continue this practice to avoid dizziness.  -ENLARGED PROSTATE: An enlarged prostate can cause urinary symptoms. We will order a PSA test to check your prostate health and refer you to urology for further evaluation and management.  -MILD HEARING LOSS: Mild hearing loss is a slight decrease in your ability to hear. Currently, it does not require any  intervention.  -COLON POLYPS: Colon polyps are growths on the lining of your colon. You had polyps identified in 2020, and no intervention is required until your next colonoscopy in 2027.  -GOALS OF CARE: You and your family prefer conservative management of your kidney condition, avoiding dialysis and transplant unless an emergency arises. You are aware of the potential for a transplant without dialysis but choose to wait due to your well-managed kidney function.  INSTRUCTIONS:  We will set up a new nephrology referral for you. Please continue taking your medications as prescribed and monitor your blood pressure at home. We will order comprehensive lab work including a metabolic panel, A1c, lipid panel, magnesium, INR, PSA, PTH, uric acid, urinalysis, and vitamin D . Additionally, we will check your urine for proteinuria if possible. A PSA test will be ordered, and you will be referred to urology for further evaluation of your enlarged prostate.It was a pleasure seeing you today! Your health and satisfaction are our top priorities.  Scherrie Curt, MD  Your Providers PCP: Anthon Kins, MD,  [phone]) Referring Provider: Anthon Kins, MD,  [phone]) Care Team Provider: Rica Chalet, MD,  [phone]) Care Team Provider: Silver Dross,  [phone])     NEXT STEPS: [x]  Early Intervention: Schedule sooner appointment, call our on-call services, or go to emergency room if there is any significant Increase in pain or discomfort New or worsening symptoms Sudden or severe changes in your health [x]  Flexible Follow-Up: We recommend a Return in about 3 months (around 12/08/2023). for optimal routine care. This allows for progress monitoring and treatment adjustments. [x]  Preventive Care: Schedule your annual preventive care visit! It's typically covered by insurance and helps identify potential health issues  early. [x]  Lab & X-ray Appointments: Incomplete tests  scheduled today, or call to schedule. X-rays: [facility] (M-F, 8:30am-noon or 1pm-5pm). [x]  Medical Information Release: Sign a release form at front desk to obtain relevant medical information we don't have.  MAKING THE MOST OF OUR FOCUSED 20 MINUTE APPOINTMENTS: [x]   Clearly state your top concerns at the beginning of the visit to focus our discussion [x]   If you anticipate you will need more time, please inform the front desk during scheduling - we can book multiple appointments in the same week. [x]   If you have transportation problems- use our convenient video appointments or ask about transportation support. [x]   We can get down to business faster if you use MyChart to update information before the visit and submit non-urgent questions before your visit. Thank you for taking the time to provide details through MyChart.  Let our nurse know and she can import this information into your encounter documents.  Arrival and Wait Times: [x]   Arriving on time ensures that everyone receives prompt attention. [x]   Early morning (8a) and afternoon (1p) appointments tend to have shortest wait times. [x]   Unfortunately, we cannot delay appointments for late arrivals or hold slots during phone calls.  Getting Answers and Following Up [x]   Simple Questions & Concerns: For quick questions or basic follow-up after your visit, reach us  at [phone] or MyChart messaging. [x]   Complex Concerns: If your concern is more complex, scheduling an appointment might be best. Discuss this with the staff to find the most suitable option. [x]   Lab & Imaging Results: We'll contact you directly if results are abnormal or you don't use MyChart. Most normal results will be on MyChart within 2-3 business days, with a review message from Dr. Boston Byers. Haven't heard back in 2 weeks? Need results sooner? Contact us  at [phone]. [x]   Referrals: Our referral coordinator will manage specialist referrals.  The specialist's office should contact you within 2 weeks to schedule an appointment. Call us  if you haven't heard from them after 2 weeks.  Staying Connected [x]   MyChart: Activate your MyChart for the fastest way to access results and message us . See the last page of this paperwork for instructions on how to activate.  Bring to Your Next Appointment [x]   Medications: Please bring all your medication bottles to your next appointment to ensure we have an accurate record of your prescriptions. [x]   Health Diaries: If you're monitoring any health conditions at home, keeping a diary of your readings can be very helpful for discussions at your next appointment.  Billing [x]   X-ray & Lab Orders: These are billed by separate companies. Contact the invoicing company directly for questions or concerns. [x]   Visit Charges: Discuss any billing inquiries with our administrative services team.  Your Satisfaction Matters [x]   Share Your Experience: We strive for your satisfaction! If you have any complaints, or preferably compliments, please let Dr. Boston Byers know directly or contact our Practice Administrators, Olinda Bertrand or [employer], by asking at the front desk.   Reviewing Your Records [x]   Review this early draft of your clinical encounter notes below and the final encounter summary tomorrow on MyChart after its been completed.  All orders placed so far are visible here: Secondary hyperparathyroidism (HCC) -     Parathyroid  hormone, intact (no Ca)  Proteinuria, unspecified type  Orthostatic hypotension  Other hyperlipidemia -     Lipid panel  Vitamin D  deficiency -  VITAMIN D  25 Hydroxy (Vit-D Deficiency, Fractures)  Leukopenia, unspecified type -     Protime-INR; Future  ESRD due to hypertension (HCC) -     Comprehensive metabolic panel with GFR -     VITAMIN D  25 Hydroxy (Vit-D Deficiency, Fractures) -     Uric acid -     Magnesium -     Ambulatory referral to Nephrology -      Protein / creatinine ratio, urine -     Urinalysis w microscopic + reflex cultur -     PSA  Primary hypertension -     Hemoglobin A1c  Benign prostatic hyperplasia with lower urinary tract symptoms, symptom details unspecified -     PSA  Erectile dysfunction, unspecified erectile dysfunction type -     Tadalafil ; Take 0.5-1 tablets (10-20 mg total) by mouth every other day as needed for erectile dysfunction.  Dispense: 10 tablet; Refill: 11 -     Ambulatory referral to Urology  Sensorineural hearing loss (SNHL) of both ears

## 2023-09-08 NOTE — Assessment & Plan Note (Signed)
 Will order lab testing to guide management.

## 2023-09-08 NOTE — Patient Instructions (Signed)
 Mr. Brent Jimenez , Thank you for taking time to come for your Medicare Wellness Visit. I appreciate your ongoing commitment to your health goals. Please review the following plan we discussed and let me know if I can assist you in the future.   Referrals/Orders/Follow-Ups/Clinician Recommendations: maintain health and activity   This is a list of the screening recommended for you and due dates:  Health Maintenance  Topic Date Due   Flu Shot  12/11/2023   Medicare Annual Wellness Visit  09/07/2024   DEXA scan (bone density measurement)  11/20/2025   Colon Cancer Screening  02/13/2026   DTaP/Tdap/Td vaccine (2 - Td or Tdap) 06/09/2028   Hepatitis C Screening  Completed   HPV Vaccine  Aged Out   Meningitis B Vaccine  Aged Out   Pneumonia Vaccine  Discontinued   COVID-19 Vaccine  Discontinued   Zoster (Shingles) Vaccine  Discontinued    Advanced directives: (In Chart) A copy of your advanced directives are scanned into your chart should your provider ever need it.  Next Medicare Annual Wellness Visit scheduled for next year: Yes

## 2023-09-08 NOTE — Assessment & Plan Note (Signed)
 Blood pressure is borderline at 136/70 mmHg. Maintaining low blood pressure is crucial due to kidney disease. Continue Amlodipine  10 mg orally daily and Bisoprolol  5 mg orally daily. Monitor blood pressure at home for more accurate readings.

## 2023-09-08 NOTE — Assessment & Plan Note (Signed)
 Lower urinary tract symptoms are present. A PSA test is due as it has not been done in a year. Order a PSA test and refer to urology for further evaluation and management.

## 2023-09-08 NOTE — Assessment & Plan Note (Signed)
 Will order lab testing to guide management., will refer care to nephrology

## 2023-09-08 NOTE — Assessment & Plan Note (Signed)
 He experiences dizziness upon standing, managed by standing up slowly.

## 2023-09-08 NOTE — Telephone Encounter (Signed)
 Critical Lab:  GFR 14.38  Message was handed to [place] to address.

## 2023-09-08 NOTE — Progress Notes (Signed)
 ==============================  [facility]: [phone]   -- Medical Office Visit --  Patient: Brent Jimenez      Age: 68 y.o.       Sex:  male  Date:   09/08/2023 Today's Healthcare Provider: Anthon Kins, MD  ==============================   Chief Complaint: Follow-up (Needs kidney function checked and other labs. ) Erectile dysfunction (ED) treatment(s) and labwork are main concern(s).  History of Present Illness 68 year old male with advanced kidney issues who presents for a follow-up on his kidney health and medication management.  He has a history of advanced kidney issues, stable since 2007, with fluctuating kidney function. His creatinine levels and other markers are closely monitored. He has experienced issues with vitamin D  supplementation affecting kidney function in the past. He is currently taking Repatha  for cholesterol, amlodipine  and Zykadia for blood pressure, and cinacalcet  for parathyroid  hormone and calcium issues related to kidney disease.  He has borderline diabetes, with regular monitoring of his A1c. He does not have a formal diagnosis of diabetes but is considered borderline, similar to his wife.  He experiences orthostatic hypotension, causing dizziness upon standing, managed by standing up slowly. No dizziness when standing slowly, but dizziness occurs if standing too quickly. He also has a history of low white blood cell count, which has improved recently.  He has a history of colon polyps, with the last occurrence in 2020, and is not due for another colonoscopy until 2027. He also has mild hearing loss and an enlarged prostate, for which a PSA test is being considered.  His current medications include amlodipine , Repatha , Zykadia, Flonase , cinacalcet , and vitamin D . He was previously on labetalol  for blood pressure but has switched to a once-daily regimen for convenience and better management.  Updated Problem List  Entries: Problem  Sensorineural Hearing Loss (Snhl) of Both Ears    mild hearing loss. He was given a list of over the counter aids that would beneficial. 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 Medications:  has a current medication list which includes the following prescription(s): amlodipine , bisoprolol , cholecalciferol , cinacalcet , repatha  sureclick, fluticasone , and tadalafil .  Allergies:  has no known allergies.  Past Medical History:  has a past medical history of Acute kidney failure with lesion of tubular necrosis (HCC) (08/12/2022), Anemia of chronic renal failure (08/12/2022), Bilateral impacted cerumen (03/25/2021), BPH (benign prostatic hyperplasia) (11/30/2013), Brain bleed (HCC) (05/12/2005), Chronic kidney disease, Chronic kidney disease, stage 4 (severe) (HCC) (02/03/2019), Erectile dysfunction (11/30/2013), Hearing loss (07/28/2022), History of cerebral infarction (09/09/2022), History of cerebrovascular accident (CVA) with residual deficit (02/03/2019), History of CVA (cerebrovascular accident) (02/03/2019), Hypercalcemia (02/03/2019), Hypercholesteremia, Hyperlipidemia (11/07/2015), Hypertension, Orthostatic hypotension (08/12/2022), Prediabetes (11/30/2013), Proteinuria (08/12/2022), Secondary hyperparathyroidism, not elsewhere classified (HCC) (08/12/2022), Sensorineural hearing loss (SNHL) of both ears (11/05/2017), Stage 5 chronic kidney  disease due to benign hypertension (HCC) (08/12/2022), Stroke (HCC), Vitamin D  deficiency (11/30/2013), and Weight loss (09/26/2022). Past Surgical History:   has a past surgical history that includes Brain surgery (05/12/2005) and Small intestine surgery. Social History:   reports that  he quit smoking about 21 years ago. His smoking use included cigarettes. He has never used smokeless tobacco. He reports that he does not drink alcohol and does not use drugs. Family History:  family history includes Cancer in his father and sister; Diabetes in his father and paternal grandfather; Heart disease in his mother; Hypertension in his father; Stroke (age of onset: 67) in his father. Depression Screen and Health Maintenance:    09/08/2023   11:02 AM 09/08/2023    9:59 AM 05/19/2023    9:03 AM 12/11/2022   11:42 AM  PHQ 2/9 Scores  PHQ - 2 Score 0 0 0 0    Medication Reconciliation: Current Outpatient Medications on File Prior to Visit  Medication Sig   amLODipine  (NORVASC ) 10 MG tablet TAKE ONE TABLET BY MOUTH ONE TIME DAILY   bisoprolol  (ZEBETA ) 5 MG tablet Take 1 tablet (5 mg total) by mouth daily. Replaces labetalol    Cholecalciferol  50 MCG (2000 UT) TABS Take by mouth.   cinacalcet  (SENSIPAR ) 30 MG tablet TAKE ONE TABLET BY MOUTH WITH BREAKFAST ON MONDAYS AND FRIDAYS   Evolocumab  (REPATHA  SURECLICK) 140 MG/ML SOAJ Inject 140 mg into the skin every 14 (fourteen) days.   fluticasone  (FLONASE ) 50 MCG/ACT nasal spray 1 spray each nostril twice a day for 3 days, then reduce to 1 spray each nostril daily or every other day through allergy season.   No current facility-administered medications on file prior to visit.   Medications Discontinued During This Encounter  Medication Reason   Saline (SIMPLY SALINE) 0.9 % AERS Completed Course   Saline (SIMPLY SALINE) 0.9 % AERS Completed Course   loratadine  (CLARITIN ) 10 MG tablet Completed Course   latanoprost (XALATAN) 0.005 % ophthalmic solution Completed  Course   Saline (SIMPLY SALINE) 0.9 % AERS Completed Course   loratadine  (CLARITIN ) 10 MG tablet Completed Course   pseudoephedrine  (SUDAFED 12 HOUR) 120 MG 12 hr tablet Completed Course   amoxicillin -clavulanate (AUGMENTIN ) 875-125 MG tablet Completed Course   Evolocumab  (REPATHA  SURECLICK) 140 MG/ML SOAJ Completed Course   trimethoprim -polymyxin b  (POLYTRIM ) ophthalmic solution Completed Course     Physical Exam:    09/08/2023   10:56 AM 09/08/2023    9:59 AM 08/03/2023    2:33 PM  Vitals with BMI  Height 6' 2.5" 6' 2.5" 6' 2.5"  Weight 207 lbs 207 lbs 209 lbs 10 oz  BMI 26.23 26.23 26.56  Systolic 136 136 130  Diastolic 70 70 81  Pulse 64 61 68  Vital signs reviewed.  Nursing notes reviewed. Weight trend reviewed. Physical Exam General Appearance:  No acute distress appreciable.   Well-groomed, healthy-appearing male.  Well proportioned with no abnormal fat distribution.  Good muscle tone. Pulmonary:  Normal work of breathing at rest, no respiratory distress apparent. SpO2: 97 %  Musculoskeletal: All extremities are intact.  Neurological:  Awake, alert, oriented, and engaged.  No obvious focal neurological deficits or cognitive impairments.  Sensorium seems unclouded.   Speech is clear and coherent with logical content. Psychiatric:  Appropriate mood, pleasant and cooperative demeanor, thoughtful and engaged during the exam Physical Exam VITALS: BP- 136/70 Tall, no change in quiet demeanor    Results for orders placed or performed in visit on 09/08/23  Comp Met (CMET)  Result Value Ref Range   Sodium 140 135 - 145 mEq/L   Potassium 3.8 3.5 - 5.1 mEq/L   Chloride 104 96 - 112 mEq/L   CO2 28 19 - 32 mEq/L   Glucose, Bld  84 70 - 99 mg/dL   BUN 42 (H) 6 - 23 mg/dL   Creatinine, Ser 5.40 (H) 0.40 - 1.50 mg/dL   Total Bilirubin 0.8 0.2 - 1.2 mg/dL   Alkaline Phosphatase 61 39 - 117 U/L   AST 18 0 - 37 U/L   ALT 12 0 - 53 U/L   Total Protein 7.0 6.0 - 8.3 g/dL   Albumin 4.3  3.5 - 5.2 g/dL   GFR 98.11 (LL) >91.47 mL/min   Calcium 9.3 8.4 - 10.5 mg/dL  Vitamin D  (25 hydroxy)  Result Value Ref Range   VITD 55.26 30.00 - 100.00 ng/mL  Uric acid  Result Value Ref Range   Uric Acid, Serum 8.8 (H) 4.0 - 7.8 mg/dL  Lipid panel  Result Value Ref Range   Cholesterol 141 0 - 200 mg/dL   Triglycerides 82.9 0.0 - 149.0 mg/dL   HDL 56.21 >30.86 mg/dL   VLDL 57.8 0.0 - 46.9 mg/dL   LDL Cholesterol 72 0 - 99 mg/dL   Total CHOL/HDL Ratio 3    NonHDL 89.46   Magnesium  Result Value Ref Range   Magnesium 2.1 1.5 - 2.5 mg/dL  HgB G2X  Result Value Ref Range   Hgb A1c MFr Bld 5.9 4.6 - 6.5 %  PSA  Result Value Ref Range   PSA 0.99 0.10 - 4.00 ng/mL  Protime-INR  Result Value Ref Range   INR 1.1 (H) 0.8 - 1.0 ratio   Prothrombin Time 11.2 9.6 - 13.1 sec   Office Visit on 09/08/2023  Component Date Value   Sodium 09/08/2023 140    Potassium 09/08/2023 3.8    Chloride 09/08/2023 104    CO2 09/08/2023 28    Glucose, Bld 09/08/2023 84    BUN 09/08/2023 42 (H)    Creatinine, Ser 09/08/2023 4.04 (H)    Total Bilirubin 09/08/2023 0.8    Alkaline Phosphatase 09/08/2023 61    AST 09/08/2023 18    ALT 09/08/2023 12    Total Protein 09/08/2023 7.0    Albumin 09/08/2023 4.3    GFR 09/08/2023 14.38 (LL)    Calcium 09/08/2023 9.3    VITD 09/08/2023 55.26    Uric Acid, Serum 09/08/2023 8.8 (H)    Cholesterol 09/08/2023 141    Triglycerides 09/08/2023 85.0    HDL 09/08/2023 51.70    VLDL 09/08/2023 17.0    LDL Cholesterol 09/08/2023 72    Total CHOL/HDL Ratio 09/08/2023 3    NonHDL 09/08/2023 89.46    Magnesium 09/08/2023 2.1    Hgb A1c MFr Bld 09/08/2023 5.9    PSA 09/08/2023 0.99    INR 09/08/2023 1.1 (H)    Prothrombin Time 09/08/2023 11.2   Office Visit on 08/03/2023  Component Date Value   Rapid Strep A Screen 08/03/2023 Negative   Office Visit on 06/15/2023  Component Date Value   Influenza A, POC 06/15/2023 Negative    Influenza B, POC 06/15/2023  Negative    SARS Coronavirus 2 Ag 06/15/2023 Negative    Rapid Strep A Screen 06/15/2023 Positive (A)   Office Visit on 12/11/2022  Component Date Value   Hemoglobin A1C 12/11/2022 5.5   Office Visit on 11/07/2022  Component Date Value   Color, UA 11/07/2022 yellow    Clarity, UA 11/07/2022 clear    Glucose, UA 11/07/2022 Negative    Bilirubin, UA 11/07/2022 negative    Ketones, UA 11/07/2022 negative    Spec Grav, UA 11/07/2022 1.015    Blood, UA 11/07/2022  negative    pH, UA 11/07/2022 6.0    Protein, UA 11/07/2022 Positive (A)    Urobilinogen, UA 11/07/2022 0.2    Nitrite, UA 11/07/2022 negative    Leukocytes, UA 11/07/2022 Negative    WBC 11/07/2022 5.5    RBC 11/07/2022 4.61    Hemoglobin 11/07/2022 11.9 (L)    HCT 11/07/2022 37.7 (L)    MCV 11/07/2022 81.7    MCHC 11/07/2022 31.7    RDW 11/07/2022 13.6    Platelets 11/07/2022 224.0    Neutrophils Relative % 11/07/2022 53.5    Lymphocytes Relative 11/07/2022 31.6    Monocytes Relative 11/07/2022 7.5    Eosinophils Relative 11/07/2022 6.8 (H)    Basophils Relative 11/07/2022 0.6    Neutro Abs 11/07/2022 2.9    Lymphs Abs 11/07/2022 1.7    Monocytes Absolute 11/07/2022 0.4    Eosinophils Absolute 11/07/2022 0.4    Basophils Absolute 11/07/2022 0.0    Total CK 11/07/2022 214    Sodium 11/07/2022 142    Potassium 11/07/2022 4.3    Chloride 11/07/2022 105    CO2 11/07/2022 31    Glucose, Bld 11/07/2022 93    BUN 11/07/2022 35 (H)    Creatinine, Ser 11/07/2022 3.53 (H)    Total Bilirubin 11/07/2022 0.7    Alkaline Phosphatase 11/07/2022 64    AST 11/07/2022 17    ALT 11/07/2022 13    Total Protein 11/07/2022 7.1    Albumin 11/07/2022 4.5    GFR 11/07/2022 17.01 (L)    Calcium 11/07/2022 10.3    Sed Rate 11/07/2022 24 (H)   Office Visit on 09/26/2022  Component Date Value   WBC 09/26/2022 3.8 (L)    RBC 09/26/2022 4.46    Platelets 09/26/2022 193.0    Hemoglobin 09/26/2022 11.9 (L)    HCT 09/26/2022 36.0 (L)     MCV 09/26/2022 80.8    MCHC 09/26/2022 32.9    RDW 09/26/2022 13.8    Glucose 09/26/2022 90    BUN 09/26/2022 48 (H)    Creatinine, Ser 09/26/2022 4.67 (H)    eGFR 09/26/2022 13 (L)    BUN/Creatinine Ratio 09/26/2022 10    Sodium 09/26/2022 139    Potassium 09/26/2022 4.1    Chloride 09/26/2022 102    CO2 09/26/2022 21    Calcium 09/26/2022 9.1    Total Protein 09/26/2022 6.4    Albumin 09/26/2022 4.5    Globulin, Total 09/26/2022 1.9    Albumin/Globulin Ratio 09/26/2022 2.4 (H)    Bilirubin Total 09/26/2022 0.5    Alkaline Phosphatase 09/26/2022 59    AST 09/26/2022 19    ALT 09/26/2022 13    Microalb, Ur 09/26/2022 12.5 (H)    Creatinine,U 09/26/2022 198.8    Microalb Creat Ratio 09/26/2022 6.3    VITD 09/26/2022 45.15    Magnesium 09/26/2022 1.9    TSH 09/26/2022 1.12    PTH 09/26/2022 295 (H)    Color, Urine 09/26/2022 YELLOW    APPearance 09/26/2022 CLEAR    Specific Gravity, Urine 09/26/2022 1.015    pH 09/26/2022 5.5    Glucose, UA 09/26/2022 NEGATIVE    Bilirubin Urine 09/26/2022 NEGATIVE    Ketones, ur 09/26/2022 TRACE (A)    Hgb urine dipstick 09/26/2022 NEGATIVE    Protein, ur 09/26/2022 1+ (A)    Nitrite 09/26/2022 NEGATIVE    Leukocytes,Ua 09/26/2022 NEGATIVE    WBC, UA 09/26/2022 NONE SEEN    RBC / HPF 09/26/2022 NONE SEEN    Squamous Epithelial / HPF  09/26/2022 NONE SEEN    Bacteria, UA 09/26/2022 NONE SEEN    Hyaline Cast 09/26/2022 NONE SEEN    Sed Rate 09/26/2022 9    CRP 09/26/2022 <1.0   No image results found. No results found.     Assessment & Plan Secondary hyperparathyroidism (HCC) Continue(s) with sensipar , will monitor calcium and vitamin d  Proteinuria, unspecified type Will order lab testing to guide management., will refer care to nephrology Orthostatic hypotension He experiences dizziness upon standing, managed by standing up slowly. Other hyperlipidemia Will order lab testing to guide management.  Vitamin D  deficiency Will  order lab testing to guide management.  Leukopenia, unspecified type Previously resolved, cause unclear ESRD due to hypertension (HCC) Chronic kidney function has been well-managed since 2007. He is not on the transplant list due to past conflicts with transplant class instructors. He and his family prefer conservative management, avoiding dialysis or transplant unless an emergency arises. Although a transplant without dialysis is possible, he chooses to wait given the stable kidney function. A new nephrology referral will be set up, avoiding previous nephrologists. Refill Cinacalcet  (Sensipar ) 30 mg orally on Mondays and Fridays and Evolocumab  (Repatha ) 140 mg/ML subcutaneously every 14 days. Order comprehensive lab work including a metabolic panel, A1c, lipid panel, magnesium, INR, PSA, PTH, uric acid, urinalysis, and vitamin D . Check urine for proteinuria if possible.  He and his family prefer conservative management of his kidney condition, avoiding dialysis and transplant unless an emergency arises. He is aware of the potential for a transplant without dialysis but chooses to wait due to well-managed kidney function. Primary hypertension Blood pressure is borderline at 136/70 mmHg. Maintaining low blood pressure is crucial due to kidney disease. Continue Amlodipine  10 mg orally daily and Bisoprolol  5 mg orally daily. Monitor blood pressure at home for more accurate readings. Benign prostatic hyperplasia with lower urinary tract symptoms, symptom details unspecified Lower urinary tract symptoms are present. A PSA test is due as it has not been done in a year. Order a PSA test and refer to urology for further evaluation and management. Erectile dysfunction, unspecified erectile dysfunction type Try switch to Cialis  due to Viagra  headache(s), and refer care to urology for more advanced intervention. Sensorineural hearing loss (SNHL) of both ears Mild sensorineural hearing loss is present, not  currently requiring intervention.      Orders Placed During this Encounter:   Orders Placed This Encounter  Procedures   Comp Met (CMET)   Vitamin D  (25 hydroxy)   Uric acid   Lipid panel   Magnesium   Protime-INR    Standing Status:   Future    Number of Occurrences:   1    Expiration Date:   09/07/2024   HgB A1c   PTH, intact (no Ca)   Protein / creatinine ratio, urine   Urinalysis w microscopic + reflex cultur   PSA   Ambulatory referral to Nephrology    Referral Priority:   Routine    Referral Type:   Consultation    Referral Reason:   Specialty Services Required    Requested Specialty:   Nephrology    Number of Visits Requested:   1   Ambulatory referral to Urology    Referral Priority:   Routine    Referral Type:   Consultation    Referral Reason:   Specialty Services Required    Requested Specialty:   Urology    Number of Visits Requested:   1   Meds ordered this encounter  Medications   tadalafil  (CIALIS ) 20 MG tablet    Sig: Take 0.5-1 tablets (10-20 mg total) by mouth every other day as needed for erectile dysfunction.    Dispense:  10 tablet    Refill:  11     This document was synthesized by artificial intelligence (Abridge) using HIPAA-compliant recording of the clinical interaction;   We discussed the use of AI scribe software for clinical note transcription with the patient, who gave verbal consent to proceed. additional Info: This encounter employed state-of-the-art, real-time, collaborative documentation. The patient actively reviewed and assisted in updating their electronic medical record on a shared screen, ensuring transparency and facilitating joint problem-solving for the problem list, overview, and plan. This approach promotes accurate, informed care. The treatment plan was discussed and reviewed in detail, including medication safety, potential side effects, and all patient questions. We confirmed understanding and comfort with the plan. Follow-up  instructions were established, including contacting the office for any concerns, returning if symptoms worsen, persist, or new symptoms develop, and precautions for potential emergency department visits.

## 2023-09-08 NOTE — Assessment & Plan Note (Signed)
 Try switch to Cialis  due to Viagra  headache(s), and refer care to urology for more advanced intervention.

## 2023-09-08 NOTE — Telephone Encounter (Signed)
 [place] from [facility] .  Critical GFR 14.38

## 2023-09-08 NOTE — Assessment & Plan Note (Signed)
 Mild sensorineural hearing loss is present, not currently requiring intervention.

## 2023-09-08 NOTE — Assessment & Plan Note (Signed)
 Chronic kidney function has been well-managed since 2007. He is not on the transplant list due to past conflicts with transplant class instructors. He and his family prefer conservative management, avoiding dialysis or transplant unless an emergency arises. Although a transplant without dialysis is possible, he chooses to wait given the stable kidney function. A new nephrology referral will be set up, avoiding previous nephrologists. Refill Cinacalcet  (Sensipar ) 30 mg orally on Mondays and Fridays and Evolocumab  (Repatha ) 140 mg/ML subcutaneously every 14 days. Order comprehensive lab work including a metabolic panel, A1c, lipid panel, magnesium, INR, PSA, PTH, uric acid, urinalysis, and vitamin D . Check urine for proteinuria if possible.  He and his family prefer conservative management of his kidney condition, avoiding dialysis and transplant unless an emergency arises. He is aware of the potential for a transplant without dialysis but chooses to wait due to well-managed kidney function.

## 2023-09-09 ENCOUNTER — Encounter: Payer: Self-pay | Admitting: Internal Medicine

## 2023-09-09 DIAGNOSIS — E790 Hyperuricemia without signs of inflammatory arthritis and tophaceous disease: Secondary | ICD-10-CM | POA: Insufficient documentation

## 2023-09-09 LAB — PROTEIN / CREATININE RATIO, URINE
Creatinine, Urine: 147 mg/dL (ref 20–320)
Protein/Creat Ratio: 639 mg/g{creat} — ABNORMAL HIGH (ref 25–148)
Protein/Creatinine Ratio: 0.639 mg/mg{creat} — ABNORMAL HIGH (ref 0.025–0.148)
Total Protein, Urine: 94 mg/dL — ABNORMAL HIGH (ref 5–25)

## 2023-09-09 LAB — URINALYSIS W MICROSCOPIC + REFLEX CULTURE
Bacteria, UA: NONE SEEN /HPF
Bilirubin Urine: NEGATIVE
Glucose, UA: NEGATIVE
Hgb urine dipstick: NEGATIVE
Hyaline Cast: NONE SEEN /LPF
Ketones, ur: NEGATIVE
Leukocyte Esterase: NEGATIVE
Nitrites, Initial: NEGATIVE
RBC / HPF: NONE SEEN /HPF (ref 0–2)
Specific Gravity, Urine: 1.014 (ref 1.001–1.035)
Squamous Epithelial / HPF: NONE SEEN /HPF (ref <=5)
WBC, UA: NONE SEEN /HPF (ref 0–5)
pH: 6 (ref 5.0–8.0)

## 2023-09-09 LAB — NO CULTURE INDICATED

## 2023-09-09 LAB — PARATHYROID HORMONE, INTACT (NO CA): PTH: 225 pg/mL — ABNORMAL HIGH (ref 16–77)

## 2023-09-09 MED ORDER — ALLOPURINOL 100 MG PO TABS: 50.0000 mg | ORAL_TABLET | Freq: Every day | ORAL | 3 refills | Status: DC

## 2023-09-09 NOTE — Progress Notes (Signed)
 Reviewed labs from 09/08/2023. Key findings: GFR 14.38 mL/min (slightly improved but still severely reduced), PTH 225 pg/mL (elevated but improved from 295), uric acid 8.8 mg/dL (elevated), protein/creatinine ratio 639 mg/g (significantly elevated). Clinical interpretation: Stable ESRD with secondary hyperparathyroidism and new hyperuricemia. Next steps: Starting allopurinol 50mg  daily, continuing Sensipar  for PTH, maintaining current BP meds, expediting nephrology referral. Labs again in 3 months. See Patient Message for detailed explanation and warning signs.

## 2023-09-09 NOTE — Addendum Note (Signed)
 Addended by: Alize Borrayo G on: 09/09/2023 07:45 PM   Modules accepted: Orders

## 2023-09-14 ENCOUNTER — Ambulatory Visit: Payer: Medicare HMO | Admitting: Internal Medicine

## 2023-09-23 ENCOUNTER — Telehealth: Payer: Self-pay

## 2023-09-23 NOTE — Telephone Encounter (Signed)
 Tried to call pt back no answer left message to call office back. Copied from CRM [id number]. Topic: Clinical - Medication Question >> Sep 23, 2023 12:15 PM Earnestine Goes B wrote: Reason for CRM: pt called to speak to provider regarding medication  loratadine  10mg s  please call pt back at [phone]

## 2023-09-23 NOTE — Telephone Encounter (Signed)
 Pt called with wife wanted to know if pcp still wanted him to continue taking loratadine  each day. I advised pt that ill send message to pcp and will give them call back soon as I get answer.

## 2023-09-24 NOTE — Telephone Encounter (Signed)
 Tried to call both number on file no answer line stated phone not In service.

## 2023-09-25 NOTE — Telephone Encounter (Signed)
 Tried to call pt and wife both numbers are not in service.

## 2023-09-28 NOTE — Telephone Encounter (Signed)
 Tried to call no answer could not leave message

## 2023-09-30 ENCOUNTER — Other Ambulatory Visit: Payer: Self-pay | Admitting: Internal Medicine

## 2023-09-30 DIAGNOSIS — I6930 Unspecified sequelae of cerebral infarction: Secondary | ICD-10-CM

## 2023-09-30 DIAGNOSIS — E7849 Other hyperlipidemia: Secondary | ICD-10-CM

## 2023-11-02 ENCOUNTER — Other Ambulatory Visit: Payer: Self-pay | Admitting: Internal Medicine

## 2023-11-02 DIAGNOSIS — E7849 Other hyperlipidemia: Secondary | ICD-10-CM

## 2023-11-02 DIAGNOSIS — I6930 Unspecified sequelae of cerebral infarction: Secondary | ICD-10-CM

## 2023-11-26 ENCOUNTER — Other Ambulatory Visit: Payer: Self-pay | Admitting: Internal Medicine

## 2023-11-26 DIAGNOSIS — E7849 Other hyperlipidemia: Secondary | ICD-10-CM

## 2023-11-26 DIAGNOSIS — I6930 Unspecified sequelae of cerebral infarction: Secondary | ICD-10-CM

## 2023-12-05 ENCOUNTER — Other Ambulatory Visit: Payer: Self-pay | Admitting: Internal Medicine

## 2023-12-05 DIAGNOSIS — I10 Essential (primary) hypertension: Secondary | ICD-10-CM

## 2023-12-08 ENCOUNTER — Ambulatory Visit: Admitting: Internal Medicine

## 2023-12-08 ENCOUNTER — Encounter: Payer: Self-pay | Admitting: Internal Medicine

## 2023-12-08 DIAGNOSIS — R4701 Aphasia: Secondary | ICD-10-CM | POA: Insufficient documentation

## 2023-12-14 ENCOUNTER — Ambulatory Visit (INDEPENDENT_AMBULATORY_CARE_PROVIDER_SITE_OTHER): Admitting: Family Medicine

## 2023-12-14 ENCOUNTER — Ambulatory Visit (INDEPENDENT_AMBULATORY_CARE_PROVIDER_SITE_OTHER)

## 2023-12-14 ENCOUNTER — Ambulatory Visit: Payer: Self-pay | Admitting: Family Medicine

## 2023-12-14 ENCOUNTER — Ambulatory Visit: Payer: Self-pay

## 2023-12-14 VITALS — BP 137/91 | HR 60 | Temp 97.2°F | Resp 16 | Ht 74.5 in | Wt 212.2 lb

## 2023-12-14 DIAGNOSIS — M79641 Pain in right hand: Secondary | ICD-10-CM

## 2023-12-14 DIAGNOSIS — I120 Hypertensive chronic kidney disease with stage 5 chronic kidney disease or end stage renal disease: Secondary | ICD-10-CM

## 2023-12-14 DIAGNOSIS — M25541 Pain in joints of right hand: Secondary | ICD-10-CM | POA: Diagnosis not present

## 2023-12-14 DIAGNOSIS — N186 End stage renal disease: Secondary | ICD-10-CM | POA: Diagnosis not present

## 2023-12-14 DIAGNOSIS — M7989 Other specified soft tissue disorders: Secondary | ICD-10-CM | POA: Diagnosis not present

## 2023-12-14 DIAGNOSIS — Z8739 Personal history of other diseases of the musculoskeletal system and connective tissue: Secondary | ICD-10-CM | POA: Diagnosis not present

## 2023-12-14 MED ORDER — PREDNISONE 20 MG PO TABS: 40.0000 mg | ORAL_TABLET | Freq: Every day | ORAL | 0 refills | Status: DC

## 2023-12-14 NOTE — Patient Instructions (Signed)
 Prednisone  sent  Await labs/x-ray  Ice

## 2023-12-14 NOTE — Telephone Encounter (Signed)
 FYI Only or Action Required?: FYI only for provider.  Patient was last seen in primary care on 09/08/2023 by Jesus Bernardino MATSU, MD.  Called Nurse Triage reporting Hand Problem.  Symptoms began several days ago.  Interventions attempted: Nothing.  Symptoms are: unchanged.  Triage Disposition: See PCP When Office is Open (Within 3 Days)  Patient/caregiver understands and will follow disposition?: Yes, will follow disposition  Copied from CRM #[id number]. Topic: Clinical - Red Word Triage >> Dec 14, 2023  9:12 AM Robinson H wrote: [facility] that prompted transfer to Nurse Triage: Right hand is really swollen around thumb and above finger and warm to the touch about a week. A little fatigue, patient has chronic kidney failure as well. Reason for Disposition  [1] MODERATE pain (e.g., interferes with normal activities) AND [2] present > 3 days  Answer Assessment - Initial Assessment Questions 1. ONSET: When did the pain start?     About a week 2. LOCATION: Where is the pain located?     R hand/finger 3. PAIN: How bad is the pain? (Scale 1-10; or mild, moderate, severe)     Mild-moderate 4. WORK OR EXERCISE: Has there been any recent work or exercise that involved this part (i.e., hand or wrist) of the body?     denies 5. CAUSE: What do you think is causing the pain?     Unsure, maybe gout 6. AGGRAVATING FACTORS: What makes the pain worse? (e.g., using computer)     Movement makes worse 7. OTHER SYMPTOMS: Do you have any other symptoms? (e.g., fever, neck pain, numbness or tingling, rash, swelling)     Swelling, warm to touch,  Protocols used: Hand Pain-A-AH

## 2023-12-14 NOTE — Progress Notes (Signed)
 X-ray-no fracture.  Just the swelling.  It won't let us  know if gout.  Will see what labs look like and how he does on prednisone 

## 2023-12-14 NOTE — Telephone Encounter (Signed)
 Appt today
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
## 2023-12-15 LAB — URIC ACID: Uric Acid, Serum: 7.1 mg/dL (ref 4.0–7.8)

## 2023-12-15 LAB — RHEUMATOID FACTOR: Rheumatoid fact SerPl-aCnc: <10 [IU]/mL (ref <14)

## 2023-12-15 NOTE — Progress Notes (Signed)
 Uric acid better, but still a little elevated.  Is he getting better?

## 2023-12-18 ENCOUNTER — Ambulatory Visit: Admitting: Internal Medicine

## 2023-12-21 ENCOUNTER — Ambulatory Visit: Payer: Self-pay | Admitting: *Deleted

## 2023-12-21 ENCOUNTER — Other Ambulatory Visit: Payer: Self-pay | Admitting: Internal Medicine

## 2023-12-21 DIAGNOSIS — M40209 Unspecified kyphosis, site unspecified: Secondary | ICD-10-CM

## 2023-12-21 NOTE — Telephone Encounter (Signed)
 noted

## 2023-12-21 NOTE — Telephone Encounter (Signed)
 Copied from CRM [id number]. Topic: Clinical - Red Word Triage >> Dec 21, 2023  1:16 PM Deaijah H wrote: Red Word that prompted transfer to Nurse Triage: Swelling in right hand Reason for Disposition  [1] MODERATE pain (e.g., interferes with normal activities) AND [2] present > 3 days  Answer Assessment - Initial Assessment Questions 1. ONSET: When did the pain start?     I was there Aug 8, and was given prednisone .   But now his hand is swelling again.  Thought it was gout but no results from blood work. 2. LOCATION: Where is the pain located?     Right  3. PAIN: How bad is the pain? (Scale 1-10; or mild, moderate, severe)     Bad with swelling 4. WORK OR EXERCISE: Has there been any recent work or exercise that involved this part (i.e., hand or wrist) of the body?     No 5. CAUSE: What do you think is causing the pain?     Gout 6. AGGRAVATING FACTORS: What makes the pain worse? (e.g., using computer)     Prednisone   7. OTHER SYMPTOMS: Do you have any other symptoms? (e.g., fever, neck pain, numbness or tingling, rash, swelling)     Right hand is swollen    8. PREGNANCY: Is there any chance you are pregnant? When was your last menstrual period?     N/A  Protocols used: Hand Pain-A-AH FYI Only or Action Required?: FYI only for provider.  Patient was last seen in primary care on 12/14/2023 by Wendolyn Jenkins Jansky, MD.  Called Nurse Triage reporting Hand Pain.  Symptoms began several weeks ago.  Interventions attempted: Prescription medications: prednisone  taken  The day after completing it his right hand is swelling and painful again.  Symptoms are: gradually worsening.  Triage Disposition: See PCP When Office is Open (Within 3 Days)  Patient/caregiver understands and will follow disposition?: Yes

## 2023-12-22 ENCOUNTER — Other Ambulatory Visit: Payer: Self-pay | Admitting: Family Medicine

## 2023-12-22 ENCOUNTER — Other Ambulatory Visit: Payer: Self-pay | Admitting: Internal Medicine

## 2023-12-22 ENCOUNTER — Other Ambulatory Visit: Payer: Self-pay | Admitting: *Deleted

## 2023-12-22 DIAGNOSIS — M40209 Unspecified kyphosis, site unspecified: Secondary | ICD-10-CM

## 2023-12-22 MED ORDER — PREDNISONE 20 MG PO TABS: 40.0000 mg | ORAL_TABLET | Freq: Every day | ORAL | 0 refills | Status: DC

## 2023-12-23 ENCOUNTER — Encounter: Payer: Self-pay | Admitting: Internal Medicine

## 2023-12-23 ENCOUNTER — Ambulatory Visit: Admitting: Internal Medicine

## 2023-12-23 ENCOUNTER — Ambulatory Visit (INDEPENDENT_AMBULATORY_CARE_PROVIDER_SITE_OTHER): Admitting: Internal Medicine

## 2023-12-23 VITALS — BP 138/88 | HR 70 | Temp 98.0°F | Ht 74.5 in | Wt 220.6 lb

## 2023-12-23 DIAGNOSIS — N184 Chronic kidney disease, stage 4 (severe): Secondary | ICD-10-CM | POA: Diagnosis not present

## 2023-12-23 DIAGNOSIS — M1A3410 Chronic gout due to renal impairment, right hand, without tophus (tophi): Secondary | ICD-10-CM

## 2023-12-23 MED ORDER — DICLOFENAC SODIUM 1 % EX GEL: 4.0000 g | Freq: Four times a day (QID) | CUTANEOUS | 11 refills | Status: DC | PRN

## 2023-12-23 MED ORDER — TRAMADOL HCL 50 MG PO TABS: 50.0000 mg | ORAL_TABLET | Freq: Three times a day (TID) | ORAL | 0 refills | Status: AC | PRN

## 2023-12-23 MED ORDER — PREDNISONE 20 MG PO TABS: ORAL_TABLET | ORAL | 0 refills | Status: DC

## 2023-12-23 NOTE — Patient Instructions (Addendum)
 It was a pleasure seeing you today! Your health and satisfaction are our top priorities.  Brent Cone, MD  VISIT SUMMARY: Today, you were seen for swelling and pain in your right index finger, which has been ongoing for two to three weeks. You have been taking prednisone  and allopurinol , and you reported improvement with prednisone  but recurring symptoms once the medication wears off. We discussed your current medications, dietary triggers for gout, and strategies to manage your symptoms.  YOUR PLAN: -GOUT FLARE OF RIGHT INDEX FINGER: A gout flare is a sudden onset of pain and swelling in a joint due to the accumulation of uric acid crystals. You should continue taking prednisone  for 3 more days and then taper off if possible. Avoid NSAIDs like ibuprofen and Aleve. Use Tylenol  for pain and apply diclofenac  gel sparingly. Ice can help reduce swelling. Perform gentle range of motion exercises for your right hand to maintain joint mobility. We will refer you to a hand specialist for a possible steroid injection if symptoms persist. Avoid dietary triggers such as shellfish, beer, sugary drinks, and certain vegetables. Allopurinol  is essential for long-term management, even though it may initially worsen flares. Tramadol  will be prescribed for severe pain management.  INSTRUCTIONS: Continue prednisone  for 3 more days and then taper off if possible. Use Tylenol  for pain and apply diclofenac  gel sparingly. Ice can help reduce swelling. Perform gentle range of motion exercises for your right hand. Avoid dietary triggers such as shellfish, beer, sugary drinks, and certain vegetables. Follow up with a hand specialist if symptoms persist for a possible steroid injection. Tramadol  will be prescribed for severe pain management.  Your Providers PCP: Jimenez Brent MATSU, MD,  [phone]) Referring Provider: [name], MD,  [phone]) Care Team Provider: Dennise Capri, MD,  [phone]) Care Team  Provider: Mollie Nestor CHRISTELLA DEVONNA,  [phone])  NEXT STEPS: [x]  Early Intervention: Schedule sooner appointment, call our on-call services, or go to emergency room if there is any significant Increase in pain or discomfort New or worsening symptoms Sudden or severe changes in your health [x]  Flexible Follow-Up: We recommend a No follow-ups on file. for optimal routine care. This allows for progress monitoring and treatment adjustments. [x]  Preventive Care: Schedule your annual preventive care visit! It's typically covered by insurance and helps identify potential health issues early. [x]  Lab & X-ray Appointments: Incomplete tests scheduled today, or call to schedule. X-rays: [facility] (M-F, 8:30am-noon or 1pm-5pm). [x]  Medical Information Release: Sign a release form at front desk to obtain relevant medical information we don't have.  MAKING THE MOST OF OUR FOCUSED 20 MINUTE APPOINTMENTS: [x]   Clearly state your top concerns at the beginning of the visit to focus our discussion [x]   If you anticipate you will need more time, please inform the front desk during scheduling - we can book multiple appointments in the same week. [x]   If you have transportation problems- use our convenient video appointments or ask about transportation support. [x]   We can get down to business faster if you use MyChart to update information before the visit and submit non-urgent questions before your visit. Thank you for taking the time to provide details through MyChart.  Let our nurse know and she can import this information into your encounter documents.  Arrival and Wait Times: [x]   Arriving on time ensures that everyone receives prompt attention. [x]   Early morning (8a) and afternoon (1p) appointments tend to have shortest wait times. [x]   Unfortunately, we cannot  delay appointments for late arrivals or hold slots during phone calls.  Getting Answers and Following Up [x]   Simple  Questions & Concerns: For quick questions or basic follow-up after your visit, reach us  at [phone] or MyChart messaging. [x]   Complex Concerns: If your concern is more complex, scheduling an appointment might be best. Discuss this with the staff to find the most suitable option. [x]   Lab & Imaging Results: We'll contact you directly if results are abnormal or you don't use MyChart. Most normal results will be on MyChart within 2-3 business days, with a review message from Dr. Jesus. Haven't heard back in 2 weeks? Need results sooner? Contact us  at [phone]. [x]   Referrals: Our referral coordinator will manage specialist referrals. The specialist's office should contact you within 2 weeks to schedule an appointment. Call us  if you haven't heard from them after 2 weeks.  Staying Connected [x]   MyChart: Activate your MyChart for the fastest way to access results and message us . See the last page of this paperwork for instructions on how to activate.  Bring to Your Next Appointment [x]   Medications: Please bring all your medication bottles to your next appointment to ensure we have an accurate record of your prescriptions. [x]   Health Diaries: If you're monitoring any health conditions at home, keeping a diary of your readings can be very helpful for discussions at your next appointment.  Billing [x]   X-ray & Lab Orders: These are billed by separate companies. Contact the invoicing company directly for questions or concerns. [x]   Visit Charges: Discuss any billing inquiries with our administrative services team.  Your Satisfaction Matters [x]   Share Your Experience: We strive for your satisfaction! If you have any complaints, or preferably compliments, please let Dr. Jesus know directly or contact our Practice Administrators, Manuelita Rubin or [employer], by asking at the front desk.   Reviewing Your Records [x]   Review this early draft of your clinical encounter notes below and  the final encounter summary tomorrow on MyChart after its been completed.  All orders placed so far are visible here: Chronic gout of right hand due to renal impairment without tophus -     Ambulatory referral to Hand Surgery -     Diclofenac  Sodium; Apply 4 g topically 4 (four) times daily as needed (limit amount per application).  Dispense: 100 g; Refill: 11 -     predniSONE ; Take 2 pills for 3 days, 1 pill for 4 days Only take if pain severe  Dispense: 10 tablet; Refill: 0 -     traMADol  HCl; Take 1 tablet (50 mg total) by mouth every 8 (eight) hours as needed for up to 5 days.  Dispense: 15 tablet; Refill: 0    Instructions for Your Right Hand Pain and Swelling (Gout Flare) You are improving after taking prednisone . Continue taking it for 3 more days as prescribed. For pain: Use the diclofenac  gel on your finger as directed. You can use tramadol  only if pain is not controlled with gel or ice--use the lowest amount possible. Other things to do: Use ice packs on your finger and gently move your hand and fingers to keep them flexible. Medicine for gout: Keep taking allopurinol  every day, even if you have flares. Diet: Avoid foods that can trigger gout--like red meat, organ meats, seafood, alcohol, sugary drinks, and some vegetables (asparagus, spinach, peas, mushrooms). What to watch for: If your finger gets worse (more pain, redness, swelling, fever, or pus), or if  you still can't move your finger after 2-3 weeks, call us  or come back. Next steps: We will check your lab and x-ray results at your next visit. If your finger is still not working well, we may send you to a hand specialist. Important: Do not use ibuprofen or Aleve (NSAIDs), as these are not safe for your kidneys. If you have any new symptoms or concerns, please contact us .   After Visit Summary (AVS)  Visit Summary You were seen today for follow-up of right hand pain and swelling, likely due to a gout flare. You have responded  well to prednisone , with significant improvement in pain and swelling. You still have some difficulty making a full fist with your right index finger, but movement is much improved and mostly pain-free. You are also being treated for chronic kidney disease (CKD), which limits some medication options.  Current Treatments Prednisone : Continue taking as prescribed (40 mg daily for 5 days). You have 3 days left. No need to extend unless symptoms worsen. Diclofenac  topical gel: Apply to the affected hand as directed for pain relief. Hand exercises: Begin gentle range-of-motion exercises for your fingers and hand. Do not force movement; gradual stretching is best. Ice: Apply ice packs to the hand as needed for swelling (15-20 minutes at a time, several times a day). Allopurinol : Continue taking 50 mg daily for gout prevention.  Dietary Guidance for Gout and CKD To help prevent future gout flares and protect your kidneys: Foods to Avoid Red meats: Beef, pork, lamb Organ meats: Liver, kidney, sweetbreads Seafood: Anchovies, sardines, mackerel, scallops, mussels, tuna, trout, shrimp, crab, lobster Alcohol: Especially beer and liquor; wine in moderation only if approved by your doctor Sugary drinks: Soda, fruit juices sweetened with sugar or high-fructose corn syrup Candy, cakes, pastries, sweetened cereals Certain vegetables (in moderation): Asparagus, spinach, peas, mushrooms, cauliflower Gravy and meat-based broths Yeast extracts: Marmite, Vegemite Foods You Can Eat Low-fat dairy products (milk, yogurt, cheese) Eggs Most vegetables (except those above, in moderation) Whole grains (if not restricted for CKD) Fruits (watch potassium if CKD is advanced) Water, tea, coffee (without added sugar)  Next Steps / Plan Finish prednisone  course as directed. Do not extend unless symptoms recur. Continue allopurinol  50 mg daily. Use diclofenac  gel for pain, as needed. Do gentle hand exercises daily to  improve movement. Apply ice to the hand for swelling as needed. Hand surgeon referral: You will be evaluated for any persistent mechanical problems with your hand. Lab and imaging follow-up: Await results of recent blood tests and hand x-ray. Monitor for symptoms: Watch for return of pain, swelling, redness, warmth, fever, or drainage. Rheumatology referral: Not needed at this time, but may be considered if symptoms persist, diagnosis is uncertain, or flares become frequent. Return to clinic if pain or swelling returns, if you cannot regain full function of your hand, or if you develop new symptoms.  When to Call or Return If pain or swelling returns after stopping prednisone  If you cannot regain full movement in your hand after 2-3 weeks If you notice fever, increasing redness, warmth, or drainage from your hand If you have any new or concerning symptoms  If you have questions about your medications, diet, or symptoms, please contact our office.This summary is for your records. Please bring it to your next appointment if you have any questions or concerns.

## 2023-12-23 NOTE — Progress Notes (Signed)
 ==============================  [facility]: [phone]   -- Medical Office Visit --  Patient: Brent Jimenez      Age: 68 y.o.       Sex:  male  Date:   12/23/2023 Today's Healthcare Provider: Bernardino KANDICE Cone, MD  ==============================   Chief Complaint: Swelling hands (Swelling in hands taking prednisone  helping but when stopping it comes right back.)  Discussed the use of AI scribe software for clinical note transcription with the patient, who gave verbal consent to proceed.  History of Present Illness Brent Jimenez is a 68 year old male with gout who presents with swelling and pain in the right index finger.  He has been experiencing swelling and pain in the right index finger for two to three weeks, primarily affecting the knuckle and leading to a loss of function as he is unable to make a full fist. The other fingers are unaffected, and there is no history of trauma or injury to the finger.  He is currently on his second course of prednisone , taking 40 mg daily. He reports improvement in swelling after taking the medication. He initially completed a five-day course and is now on a second course. There is significant improvement in symptoms with prednisone , noting that after taking the medication, the pain subsides and the swelling decreases. However, he experiences a return of stiffness and pain once the medication wears off.  He has been on allopurinol , taking 50 mg daily, for the past three months, which was initiated by a kidney doctor. He is also using diclofenac  gel (Voltaren ), applying it sparingly to the affected area.  No history of psoriasis. He is aware that certain foods, such as shellfish and beer, can exacerbate gout, but he states he has not consumed these recently.  The pain in his finger can be severe, affecting his ability to perform tasks that require fine motor skills, such as counting money.  Background  Reviewed: Problem List: has Hypertension; BPH (benign prostatic hyperplasia); Prediabetes; Vitamin D  deficiency; Erectile dysfunction; Hyperlipidemia; Sensorineural hearing loss (SNHL) of both ears; ESRD due to hypertension (HCC); History of cerebrovascular accident (CVA) with residual deficit; Hearing loss; Secondary hyperparathyroidism (HCC); Proteinuria; Anemia of chronic renal failure; Orthostatic hypotension; Kyphosis; History of colon polyps; Leucopenia; Lower urinary tract symptoms (LUTS); Uricacidemia; Anemia in chronic kidney disease; Aphasia; and Stage 4 chronic kidney disease (HCC) on their problem list. Past Medical History:  has a past medical history of Acute kidney failure with lesion of tubular necrosis (HCC) (08/12/2022), Anemia of chronic renal failure (08/12/2022), Bilateral impacted cerumen (03/25/2021), BPH (benign prostatic hyperplasia) (11/30/2013), Brain bleed (HCC) (05/12/2005), Chronic kidney disease, Chronic kidney disease, stage 4 (severe) (HCC) (02/03/2019), Erectile dysfunction (11/30/2013), Hearing loss (07/28/2022), History of cerebral infarction (09/09/2022), History of cerebrovascular accident (CVA) with residual deficit (02/03/2019), History of CVA (cerebrovascular accident) (02/03/2019), Hypercalcemia (02/03/2019), Hypercholesteremia, Hyperlipidemia (11/07/2015), Hypertension, Orthostatic hypotension (08/12/2022), Pre-transplant evaluation for kidney transplant (11/22/2022), Prediabetes (11/30/2013), Proteinuria (08/12/2022), Secondary hyperparathyroidism, not elsewhere classified (HCC) (08/12/2022), Sensorineural hearing loss (SNHL) of both ears (11/05/2017), Stage 5 chronic kidney disease due to benign hypertension (HCC) (08/12/2022), Stroke (HCC), Vitamin D  deficiency (11/30/2013), and Weight loss (09/26/2022). Past Surgical History:   has a past surgical history that includes Brain surgery (05/12/2005) and Small intestine surgery. Social History:   reports that he quit  smoking about 21 years ago. His smoking use included cigarettes. He has never used smokeless tobacco. He reports that he does not drink alcohol and does not  use drugs. Family History:  family history includes Cancer in his father and sister; Diabetes in his father and paternal grandfather; Heart disease in his mother; Hypertension in his father; Stroke (age of onset: 84) in his father. Allergies:  has no known allergies.   Medication Reconciliation: Current Outpatient Medications on File Prior to Visit  Medication Sig   allopurinol  (ZYLOPRIM ) 100 MG tablet Take 0.5 tablets (50 mg total) by mouth daily. (Patient taking differently: Take 50 mg by mouth daily. Taking half of tablet)   amLODipine  (NORVASC ) 10 MG tablet TAKE ONE TABLET BY MOUTH ONCE A DAY   bisoprolol  (ZEBETA ) 5 MG tablet TAKE ONE TABLET BY MOUTH ONCE A DAY   Cholecalciferol  50 MCG (2000 UT) TABS Take by mouth.   cinacalcet  (SENSIPAR ) 30 MG tablet TAKE ONE TABLET BY MOUTH WITH BREAKFAST ON MONDAYS AND FRIDAYS   fluticasone  (FLONASE ) 50 MCG/ACT nasal spray 1 spray each nostril twice a day for 3 days, then reduce to 1 spray each nostril daily or every other day through allergy season.   predniSONE  (DELTASONE ) 20 MG tablet Take 2 tablets (40 mg total) by mouth daily with breakfast for 5 days.   REPATHA  SURECLICK 140 MG/ML SOAJ INJECT 140MG  INTO THE SKIN ONCE EVERY FOURTEEN DAYS   tadalafil  (CIALIS ) 20 MG tablet Take 0.5-1 tablets (10-20 mg total) by mouth every other day as needed for erectile dysfunction.   predniSONE  (DELTASONE ) 20 MG tablet Take 2 tablets (40 mg total) by mouth daily with breakfast for 5 days.   No current facility-administered medications on file prior to visit.  There are no discontinued medications.   Physical Exam:    12/23/2023    8:51 AM 12/23/2023    8:21 AM 12/14/2023    3:53 PM  Vitals with BMI  Height  6' 2.5   Weight  220 lbs 10 oz   BMI  27.95   Systolic 138 150 862  Diastolic 88 96 91  Pulse  70    Vital signs reviewed.  Nursing notes reviewed. Weight trend reviewed. Physical Exam General Appearance:  No acute distress appreciable.   Well-groomed, healthy-appearing male.  Well proportioned with no abnormal fat distribution.  Good muscle tone. Pulmonary:  Normal work of breathing at rest, no respiratory distress apparent. SpO2: 96 %  Musculoskeletal: All extremities are intact.  Neurological:  Awake, alert, oriented, and engaged.  No obvious focal neurological deficits or cognitive impairments.  Sensorium seems unclouded.   Speech is clear and coherent with logical content. Psychiatric:  Appropriate mood, pleasant and cooperative demeanor, thoughtful and engaged during the exam Physical Exam MUSCULOSKELETAL: Swelling of the right index finger and knuckle with associated loss of function.  Photographs Taken 12/23/2023 : Note the swelling and incomplete closure of right index finger into fist     Office Visit on 12/14/2023  Component Date Value Ref Range Status   Rheumatoid fact SerPl-aCnc 12/14/2023 <10  <14 IU/mL Final   Uric Acid, Serum 12/14/2023 7.1  4.0 - 7.8 mg/dL Final  Office Visit on 09/08/2023  Component Date Value Ref Range Status   Sodium 09/08/2023 140  135 - 145 mEq/L Final   Potassium 09/08/2023 3.8  3.5 - 5.1 mEq/L Final   Chloride 09/08/2023 104  96 - 112 mEq/L Final   CO2 09/08/2023 28  19 - 32 mEq/L Final   Glucose, Bld 09/08/2023 84  70 - 99 mg/dL Final   BUN 95/70/7974 42 (H)  6 - 23 mg/dL Final   Creatinine,  Ser 09/08/2023 4.04 (H)  0.40 - 1.50 mg/dL Final   Total Bilirubin 09/08/2023 0.8  0.2 - 1.2 mg/dL Final   Alkaline Phosphatase 09/08/2023 61  39 - 117 U/L Final   AST 09/08/2023 18  0 - 37 U/L Final   ALT 09/08/2023 12  0 - 53 U/L Final   Total Protein 09/08/2023 7.0  6.0 - 8.3 g/dL Final   Albumin 95/70/7974 4.3  3.5 - 5.2 g/dL Final   GFR 95/70/7974 14.38 (LL)  >60.00 mL/min Final   Calcium 09/08/2023 9.3  8.4 - 10.5 mg/dL Final   VITD 95/70/7974  55.26  30.00 - 100.00 ng/mL Final   Uric Acid, Serum 09/08/2023 8.8 (H)  4.0 - 7.8 mg/dL Final   Cholesterol 95/70/7974 141  0 - 200 mg/dL Final   Triglycerides 95/70/7974 85.0  0.0 - 149.0 mg/dL Final   HDL 95/70/7974 51.70  >39.00 mg/dL Final   VLDL 95/70/7974 17.0  0.0 - 40.0 mg/dL Final   LDL Cholesterol 09/08/2023 72  0 - 99 mg/dL Final   Total CHOL/HDL Ratio 09/08/2023 3   Final   NonHDL 09/08/2023 89.46   Final   Magnesium 09/08/2023 2.1  1.5 - 2.5 mg/dL Final   Hgb J8r MFr Bld 09/08/2023 5.9  4.6 - 6.5 % Final   PTH 09/08/2023 225 (H)  16 - 77 pg/mL Final   Creatinine, Urine 09/08/2023 147  20 - 320 mg/dL Final   Protein/Creat Ratio 09/08/2023 639 (H)  25 - 148 mg/g creat Final   Protein/Creatinine Ratio 09/08/2023 0.639 (H)  0.025 - 0.148 mg/mg creat Final   Total Protein, Urine 09/08/2023 94 (H)  5 - 25 mg/dL Final   Color, Urine 95/70/7974 YELLOW  YELLOW Final   APPearance 09/08/2023 CLEAR  CLEAR Final   Specific Gravity, Urine 09/08/2023 1.014  1.001 - 1.035 Final   pH 09/08/2023 6.0  5.0 - 8.0 Final   Glucose, UA 09/08/2023 NEGATIVE  NEGATIVE Final   Bilirubin Urine 09/08/2023 NEGATIVE  NEGATIVE Final   Ketones, ur 09/08/2023 NEGATIVE  NEGATIVE Final   Hgb urine dipstick 09/08/2023 NEGATIVE  NEGATIVE Final   Protein, ur 09/08/2023 2+ (A)  NEGATIVE Final   Nitrites, Initial 09/08/2023 NEGATIVE  NEGATIVE Final   Leukocyte Esterase 09/08/2023 NEGATIVE  NEGATIVE Final   WBC, UA 09/08/2023 NONE SEEN  0 - 5 /HPF Final   RBC / HPF 09/08/2023 NONE SEEN  0 - 2 /HPF Final   Squamous Epithelial / HPF 09/08/2023 NONE SEEN  < OR = 5 /HPF Final   Bacteria, UA 09/08/2023 NONE SEEN  NONE SEEN /HPF Final   Hyaline Cast 09/08/2023 NONE SEEN  NONE SEEN /LPF Final   Note 09/08/2023    Final   PSA 09/08/2023 0.99  0.10 - 4.00 ng/mL Final   INR 09/08/2023 1.1 (H)  0.8 - 1.0 ratio Final   Prothrombin Time 09/08/2023 11.2  9.6 - 13.1 sec Final   Reflexve Urine Culture 09/08/2023    Final   Office Visit on 08/03/2023  Component Date Value Ref Range Status   Rapid Strep A Screen 08/03/2023 Negative  Negative Final  Office Visit on 06/15/2023  Component Date Value Ref Range Status   Influenza A, POC 06/15/2023 Negative  Negative Final   Influenza B, POC 06/15/2023 Negative  Negative Final   SARS Coronavirus 2 Ag 06/15/2023 Negative  Negative Final   Rapid Strep A Screen 06/15/2023 Positive (A)  Negative Final  Office Visit on 12/11/2022  Component Date  Value Ref Range Status   Hemoglobin A1C 12/11/2022 5.5  4.0 - 5.6 % Final  Office Visit on 11/07/2022  Component Date Value Ref Range Status   Color, UA 11/07/2022 yellow   Final   Clarity, UA 11/07/2022 clear   Final   Glucose, UA 11/07/2022 Negative  Negative Final   Bilirubin, UA 11/07/2022 negative   Final   Ketones, UA 11/07/2022 negative   Final   Spec Grav, UA 11/07/2022 1.015  1.010 - 1.025 Final   Blood, UA 11/07/2022 negative   Final   pH, UA 11/07/2022 6.0  5.0 - 8.0 Final   Protein, UA 11/07/2022 Positive (A)  Negative Final   Urobilinogen, UA 11/07/2022 0.2  0.2 or 1.0 E.U./dL Final   Nitrite, UA 93/71/7975 negative   Final   Leukocytes, UA 11/07/2022 Negative  Negative Final   WBC 11/07/2022 5.5  4.0 - 10.5 K/uL Final   RBC 11/07/2022 4.61  4.22 - 5.81 Mil/uL Final   Hemoglobin 11/07/2022 11.9 (L)  13.0 - 17.0 g/dL Final   HCT 93/71/7975 37.7 (L)  39.0 - 52.0 % Final   MCV 11/07/2022 81.7  78.0 - 100.0 fl Final   MCHC 11/07/2022 31.7  30.0 - 36.0 g/dL Final   RDW 93/71/7975 13.6  11.5 - 15.5 % Final   Platelets 11/07/2022 224.0  150.0 - 400.0 K/uL Final   Neutrophils Relative % 11/07/2022 53.5  43.0 - 77.0 % Final   Lymphocytes Relative 11/07/2022 31.6  12.0 - 46.0 % Final   Monocytes Relative 11/07/2022 7.5  3.0 - 12.0 % Final   Eosinophils Relative 11/07/2022 6.8 (H)  0.0 - 5.0 % Final   Basophils Relative 11/07/2022 0.6  0.0 - 3.0 % Final   Neutro Abs 11/07/2022 2.9  1.4 - 7.7 K/uL Final   Lymphs  Abs 11/07/2022 1.7  0.7 - 4.0 K/uL Final   Monocytes Absolute 11/07/2022 0.4  0.1 - 1.0 K/uL Final   Eosinophils Absolute 11/07/2022 0.4  0.0 - 0.7 K/uL Final   Basophils Absolute 11/07/2022 0.0  0.0 - 0.1 K/uL Final   Total CK 11/07/2022 214  7 - 232 U/L Final   Sodium 11/07/2022 142  135 - 145 mEq/L Final   Potassium 11/07/2022 4.3  3.5 - 5.1 mEq/L Final   Chloride 11/07/2022 105  96 - 112 mEq/L Final   CO2 11/07/2022 31  19 - 32 mEq/L Final   Glucose, Bld 11/07/2022 93  70 - 99 mg/dL Final   BUN 93/71/7975 35 (H)  6 - 23 mg/dL Final   Creatinine, Ser 11/07/2022 3.53 (H)  0.40 - 1.50 mg/dL Final   Total Bilirubin 11/07/2022 0.7  0.2 - 1.2 mg/dL Final   Alkaline Phosphatase 11/07/2022 64  39 - 117 U/L Final   AST 11/07/2022 17  0 - 37 U/L Final   ALT 11/07/2022 13  0 - 53 U/L Final   Total Protein 11/07/2022 7.1  6.0 - 8.3 g/dL Final   Albumin 93/71/7975 4.5  3.5 - 5.2 g/dL Final   GFR 93/71/7975 17.01 (L)  >60.00 mL/min Final   Calcium 11/07/2022 10.3  8.4 - 10.5 mg/dL Final   Sed Rate 93/71/7975 24 (H)  0 - 20 mm/hr Final  Office Visit on 09/26/2022  Component Date Value Ref Range Status   WBC 09/26/2022 3.8 (L)  4.0 - 10.5 K/uL Final   RBC 09/26/2022 4.46  4.22 - 5.81 Mil/uL Final   Platelets 09/26/2022 193.0  150.0 - 400.0 K/uL Final  Hemoglobin 09/26/2022 11.9 (L)  13.0 - 17.0 g/dL Final   HCT 94/82/7975 36.0 (L)  39.0 - 52.0 % Final   MCV 09/26/2022 80.8  78.0 - 100.0 fl Final   MCHC 09/26/2022 32.9  30.0 - 36.0 g/dL Final   RDW 94/82/7975 13.8  11.5 - 15.5 % Final   Glucose 09/26/2022 90  70 - 99 mg/dL Final   BUN 94/82/7975 48 (H)  8 - 27 mg/dL Final   Creatinine, Ser 09/26/2022 4.67 (H)  0.76 - 1.27 mg/dL Final   eGFR 94/82/7975 13 (L)  >59 mL/min/1.73 Final   BUN/Creatinine Ratio 09/26/2022 10  10 - 24 Final   Sodium 09/26/2022 139  134 - 144 mmol/L Final   Potassium 09/26/2022 4.1  3.5 - 5.2 mmol/L Final   Chloride 09/26/2022 102  96 - 106 mmol/L Final   CO2  09/26/2022 21  20 - 29 mmol/L Final   Calcium 09/26/2022 9.1  8.6 - 10.2 mg/dL Final   Total Protein 94/82/7975 6.4  6.0 - 8.5 g/dL Final   Albumin 94/82/7975 4.5  3.9 - 4.9 g/dL Final   Globulin, Total 09/26/2022 1.9  1.5 - 4.5 g/dL Final   Albumin/Globulin Ratio 09/26/2022 2.4 (H)  1.2 - 2.2 Final   Bilirubin Total 09/26/2022 0.5  0.0 - 1.2 mg/dL Final   Alkaline Phosphatase 09/26/2022 59  44 - 121 IU/L Final   AST 09/26/2022 19  0 - 40 IU/L Final   ALT 09/26/2022 13  0 - 44 IU/L Final   VITD 09/26/2022 45.15  30.00 - 100.00 ng/mL Final   Magnesium 09/26/2022 1.9  1.5 - 2.5 mg/dL Final   TSH 94/82/7975 1.12  0.35 - 5.50 uIU/mL Final   PTH 09/26/2022 295 (H)  16 - 77 pg/mL Final   Color, Urine 09/26/2022 YELLOW  YELLOW Final   APPearance 09/26/2022 CLEAR  CLEAR Final   Specific Gravity, Urine 09/26/2022 1.015  1.001 - 1.035 Final   pH 09/26/2022 5.5  5.0 - 8.0 Final   Glucose, UA 09/26/2022 NEGATIVE  NEGATIVE Final   Bilirubin Urine 09/26/2022 NEGATIVE  NEGATIVE Final   Ketones, ur 09/26/2022 TRACE (A)  NEGATIVE Final   Hgb urine dipstick 09/26/2022 NEGATIVE  NEGATIVE Final   Protein, ur 09/26/2022 1+ (A)  NEGATIVE Final   Nitrite 09/26/2022 NEGATIVE  NEGATIVE Final   Leukocytes,Ua 09/26/2022 NEGATIVE  NEGATIVE Final   WBC, UA 09/26/2022 NONE SEEN  0 - 5 /HPF Final   RBC / HPF 09/26/2022 NONE SEEN  0 - 2 /HPF Final   Squamous Epithelial / HPF 09/26/2022 NONE SEEN  < OR = 5 /HPF Final   Bacteria, UA 09/26/2022 NONE SEEN  NONE SEEN /HPF Final   Hyaline Cast 09/26/2022 NONE SEEN  NONE SEEN /LPF Final   Sed Rate 09/26/2022 9  0 - 20 mm/hr Final   CRP 09/26/2022 <1.0  0.5 - 20.0 mg/dL Final  No image results found. DG Hand Complete Right Result Date: 12/14/2023 CLINICAL DATA:  pain R index mcp joint and swelling.  h/o gout. EXAM: RIGHT HAND - COMPLETE 3+ VIEW COMPARISON:  None Available. FINDINGS: No acute fracture or dislocation. No erosions. There is no evidence of arthropathy or other  focal bone abnormality. Soft tissue swelling about the second digit. No radiopaque foreign body. IMPRESSION: Soft tissue swelling about the second digit. No acute fracture or dislocation. Electronically Signed   By: Rogelia Myers M.D.   On: 12/14/2023 18:01  ASSESSMENT & PLAN   Assessment & Plan Chronic gout of right hand due to renal impairment without tophus  Right Hand Pain and Swelling, Likely Gout Flare Status: Significant improvement post-prednisone ; residual inability to make a full fist with right index finger. Etiology: Most consistent with gout flare given clinical history, exam, and steroid responsiveness. Awaiting lab (uric acid, rheumatoid factor) and imaging (right hand x-ray) to further evaluate and rule out alternative etiologies (e.g., rheumatoid arthritis, infection, structural abnormality). Current Treatment:  Prednisone  40 mg daily (5 days total; 3 days remaining). Diclofenac  topical gel for localized pain relief (appropriate for CKD). Tramadol  (15 tablets) prescribed for breakthrough pain; use lowest effective dose, avoid overuse given CKD and risk of side effects. Supportive therapies: gentle hand exercises, ice application. Additional Measures:  Continue allopurinol  50 mg daily for urate lowering; monitor for flare risk. Refer to hand surgery for persistent mechanical dysfunction. Dietary counseling: avoid high-purine foods (red/organ meats, seafood), alcohol, sugary drinks, and certain vegetables (asparagus, spinach, peas, mushrooms) in moderation. Patient education: monitor for infection, worsening pain, or inability to regain hand function; return if symptoms recur/worsen. 2. Chronic Kidney Disease (Stage 4/5) Status: Stable since 2007. Management: Continue CKD precautions; avoid NSAIDs, monitor renal function, maintain hydration. Medication Considerations: Allopurinol  dose appropriately reduced; tramadol  used cautiously. 3. Pain Management Tramadol : As  needed for moderate pain not controlled with topical diclofenac  or ice; limit use to prevent dependence and minimize side effects (especially in CKD). Non-pharmacologic: Continue ice and gentle exercises. 4. Follow-up/Return Precautions Monitor for symptom recurrence after prednisone  completion. Return if unable to regain full hand function after 2-3 weeks, or if new symptoms (fever, redness, warmth, drainage) develop. Review lab and imaging results at next visit. If symptoms persist, diagnosis remains unclear, or flares become frequent, consider rheumatology referral.   ORDER ASSOCIATIONS  #   DIAGNOSIS / CONDITION ICD-10 ENCOUNTER ORDER     ICD-10-CM   1. Chronic gout of right hand due to renal impairment without tophus  M1A.3410 Ambulatory referral to Hand Surgery    diclofenac  Sodium (VOLTAREN ) 1 % GEL    predniSONE  (DELTASONE ) 20 MG tablet    traMADol  (ULTRAM ) 50 MG tablet         Orders Placed in Encounter:   Meds ordered this encounter  Medications   diclofenac  Sodium (VOLTAREN ) 1 % GEL    Sig: Apply 4 g topically 4 (four) times daily as needed (limit amount per application).    Dispense:  100 g    Refill:  11   predniSONE  (DELTASONE ) 20 MG tablet    Sig: Take 2 pills for 3 days, 1 pill for 4 days Only take if pain severe    Dispense:  10 tablet    Refill:  0   traMADol  (ULTRAM ) 50 MG tablet    Sig: Take 1 tablet (50 mg total) by mouth every 8 (eight) hours as needed for up to 5 days.    Dispense:  15 tablet    Refill:  0    Orders Placed This Encounter  Procedures   Ambulatory referral to Hand Surgery    Referral Priority:   Urgent    Referral Type:   Surgical    Referral Reason:   Specialty Services Required    Requested Specialty:   Hand Surgery    Number of Visits Requested:   1   Ambulatory referral to Hand Surgery       Comments: Possible metacarpophalangeal joint steroid injection for gout. Right index  This document was synthesized by  artificial intelligence ([employer]) using HIPAA-compliant recording of the clinical interaction;   We discussed the use of AI scribe software for clinical note transcription with the patient, who gave verbal consent to proceed. additional Info: This encounter employed state-of-the-art, real-time, collaborative documentation. The patient actively reviewed and assisted in updating their electronic medical record on a shared screen, ensuring transparency and facilitating joint problem-solving for the problem list, overview, and plan. This approach promotes accurate, informed care. The treatment plan was discussed and reviewed in detail, including medication safety, potential side effects, and all patient questions. We confirmed understanding and comfort with the plan. Follow-up instructions were established, including contacting the office for any concerns, returning if symptoms worsen, persist, or new symptoms develop, and precautions for potential emergency department visits.

## 2023-12-25 ENCOUNTER — Ambulatory Visit (INDEPENDENT_AMBULATORY_CARE_PROVIDER_SITE_OTHER): Admitting: Surgical

## 2023-12-25 DIAGNOSIS — M109 Gout, unspecified: Secondary | ICD-10-CM | POA: Diagnosis not present

## 2023-12-27 ENCOUNTER — Encounter: Payer: Self-pay | Admitting: Surgical
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
## 2023-12-28 ENCOUNTER — Other Ambulatory Visit: Payer: Self-pay | Admitting: Internal Medicine

## 2023-12-28 DIAGNOSIS — E7849 Other hyperlipidemia: Secondary | ICD-10-CM

## 2023-12-28 DIAGNOSIS — I6930 Unspecified sequelae of cerebral infarction: Secondary | ICD-10-CM

## 2023-12-30 ENCOUNTER — Ambulatory Visit: Admitting: Internal Medicine

## 2024-01-04 ENCOUNTER — Other Ambulatory Visit: Payer: Self-pay | Admitting: Internal Medicine

## 2024-01-04 DIAGNOSIS — I10 Essential (primary) hypertension: Secondary | ICD-10-CM

## 2024-01-05 DIAGNOSIS — H6502 Acute serous otitis media, left ear: Secondary | ICD-10-CM | POA: Diagnosis not present

## 2024-01-05 DIAGNOSIS — H6122 Impacted cerumen, left ear: Secondary | ICD-10-CM | POA: Diagnosis not present

## 2024-01-06 ENCOUNTER — Ambulatory Visit: Admitting: Internal Medicine

## 2024-01-06 DIAGNOSIS — I120 Hypertensive chronic kidney disease with stage 5 chronic kidney disease or end stage renal disease: Secondary | ICD-10-CM | POA: Diagnosis not present

## 2024-01-06 DIAGNOSIS — N2581 Secondary hyperparathyroidism of renal origin: Secondary | ICD-10-CM | POA: Diagnosis not present

## 2024-01-06 DIAGNOSIS — D631 Anemia in chronic kidney disease: Secondary | ICD-10-CM | POA: Diagnosis not present

## 2024-01-06 DIAGNOSIS — N185 Chronic kidney disease, stage 5: Secondary | ICD-10-CM | POA: Diagnosis not present

## 2024-01-06 LAB — LAB REPORT - SCANNED: EGFR: 16

## 2024-01-15 ENCOUNTER — Ambulatory Visit: Admitting: Family Medicine

## 2024-01-21 ENCOUNTER — Encounter: Payer: Self-pay | Admitting: Internal Medicine

## 2024-01-21 ENCOUNTER — Ambulatory Visit (INDEPENDENT_AMBULATORY_CARE_PROVIDER_SITE_OTHER): Admitting: Internal Medicine

## 2024-01-21 ENCOUNTER — Ambulatory Visit: Admitting: Internal Medicine

## 2024-01-21 VITALS — BP 126/82 | HR 71 | Temp 97.8°F | Ht 74.5 in | Wt 223.6 lb

## 2024-01-21 DIAGNOSIS — M109 Gout, unspecified: Secondary | ICD-10-CM

## 2024-01-21 DIAGNOSIS — N186 End stage renal disease: Secondary | ICD-10-CM

## 2024-01-21 DIAGNOSIS — I120 Hypertensive chronic kidney disease with stage 5 chronic kidney disease or end stage renal disease: Secondary | ICD-10-CM

## 2024-01-21 LAB — BASIC METABOLIC PANEL WITH GFR
BUN: 35 mg/dL — ABNORMAL HIGH (ref 6–23)
CO2: 25 meq/L (ref 19–32)
Calcium: 9.9 mg/dL (ref 8.4–10.5)
Chloride: 104 meq/L (ref 96–112)
Creatinine, Ser: 3.64 mg/dL — ABNORMAL HIGH (ref 0.40–1.50)
GFR: 16.25 mL/min — ABNORMAL LOW (ref >60.00)
Glucose, Bld: 90 mg/dL (ref 70–99)
Potassium: 3.9 meq/L (ref 3.5–5.1)
Sodium: 138 meq/L (ref 135–145)

## 2024-01-21 LAB — URIC ACID: Uric Acid, Serum: 8.4 mg/dL — ABNORMAL HIGH (ref 4.0–7.8)

## 2024-01-21 MED ORDER — PREDNISONE 20 MG PO TABS: ORAL_TABLET | ORAL | 0 refills | Status: DC

## 2024-01-21 NOTE — Assessment & Plan Note (Signed)
 Chronic gout of right hand due to renal impairment   Chronic gout in the right hand presents with persistent pain and swelling, likely from uric acid crystal deposition. Pseudogout is a differential diagnosis. Pain worsens with movement and has persisted for a month. Previous prednisone  courses provided temporary relief. He is currently on allopurinol  100 mg daily and uses diclofenac  gel topically. Colchicine is not an option due to renal impairment. Joint aspiration has not been performed due to a history of gout flares. Continue allopurinol  100 mg daily. Prescribe another prednisone  course for inflammation and pain management. Confirm ice use is permissible. Refer to an orthopedic surgeon for potential joint aspiration and steroid injection. Check uric acid levels to assess the need for adjusting allopurinol  dosage. Send a letter to the orthopedic surgeon for an earlier appointment. Advise using prednisone  as needed for pain, similar to Tylenol , but ensure the course is completed as prescribed. He is not on dialysis and is monitored by a nephrologist. He is being evaluated for a potential kidney transplant, as early transplantation before dialysis is preferred for better outcomes. Obtain blood work to assess kidney function and uric acid levels. Encourage follow-up with the nephrologist for ongoing management and transplant evaluation.  Considered colchicine but pain doesn't seem worth renal risk.

## 2024-01-21 NOTE — Progress Notes (Signed)
 ==============================  [facility]: [phone]   -- Medical Office Visit --  Patient: Brent Jimenez      Age: 68 y.o.       Sex:  male  Date:   01/21/2024 Today's Healthcare Provider: Bernardino KANDICE Cone, MD  ==============================   Chief Complaint: Hand Pain (Swelling warm to touch as well. Going on over week now )   Discussed the use of AI scribe software for clinical note transcription with the patient, who gave verbal consent to proceed.  History of Present Illness 68 year old male with gout who presents with persistent hand pain.  He has been experiencing persistent pain and swelling in his right hand, specifically affecting one finger, for about a month. The pain is exacerbated by movement and is described as uncomfortable, sometimes reaching a pain level of six out of ten. The pain is primarily present when using the hand, such as when eating or moving a fork or spoon.  He has a history of gout and has been on allopurinol  100 mg once daily for approximately five months. Despite this, he experienced a flare-up of symptoms about a month ago. He has previously been treated with prednisone , which provided temporary relief, but symptoms returned after cessation. He has completed two courses of prednisone , with the last course ending over two weeks ago. He also uses diclofenac  gel for pain management.  An x-ray was performed, but it did not show definitive results. No aspiration of the joint has been performed.  He has chronic kidney disease, which complicates the management of his gout. He is not currently on dialysis. His kidney function has been stable, and he is under the care of a nephrologist.  No pain in the left hand. Reports pain and swelling in the right hand, particularly in one finger, exacerbated by movement. Patient with chronic stage 4 kidney disease Lab Results  Component Value Date   GFR 16.25 (L) 01/21/2024    GFR 14.38 (LL) 09/08/2023   GFR 17.01 (L) 11/07/2022   Lab Results  Component Value Date   EGFR 16.0 01/06/2024   EGFR 13 (L) 09/26/2022   Lab Results  Component Value Date   LABURIC 8.4 (H) 01/21/2024   Was put on allopurinol  08/2023  About 12/11/2023 he had severe presumably gout flare right index metacarpophalangeal joint  Put on prednisone , hand surgeon holding off on injection Prednisone  helps it We are avoiding prednisone     Prior to Admission medications   Medication Sig Start Date End Date Taking? Authorizing Provider  predniSONE  (DELTASONE ) 20 MG tablet Take 2 pills for 3 days, 1 pill for 4 days 01/21/24  Yes Cone Bernardino KANDICE, MD  amLODipine  (NORVASC ) 10 MG tablet TAKE ONE TABLET BY MOUTH ONCE A DAY 01/04/24   Cone Bernardino KANDICE, MD  bisoprolol  (ZEBETA ) 5 MG tablet TAKE ONE TABLET BY MOUTH ONCE A DAY 12/22/23   Cone Bernardino KANDICE, MD  Cholecalciferol  50 MCG (2000 UT) TABS Take by mouth.    [provider]  cinacalcet  (SENSIPAR ) 30 MG tablet TAKE ONE TABLET BY MOUTH WITH BREAKFAST ON MONDAYS AND FRIDAYS 09/07/23   Cone Bernardino KANDICE, MD  fluticasone  (FLONASE ) 50 MCG/ACT nasal spray 1 spray each nostril twice a day for 3 days, then reduce to 1 spray each nostril daily or every other day through allergy season. 08/06/23   Lucius Krabbe, NP  REPATHA  SURECLICK 140 MG/ML SOAJ INJECT 140MG  INTO THE SKIN ONCE EVERY FOURTEEN DAYS 12/28/23  Jesus Bernardino MATSU, MD     Background Reviewed: Problem List: has Hypertension; BPH (benign prostatic hyperplasia); Prediabetes; Vitamin D  deficiency; Erectile dysfunction; Hyperlipidemia; Sensorineural hearing loss (SNHL) of both ears; ESRD due to hypertension (HCC); History of cerebrovascular accident (CVA) with residual deficit; Hearing loss; Secondary hyperparathyroidism (HCC); Proteinuria; Anemia of chronic renal failure; Orthostatic hypotension; Kyphosis; History of colon polyps; Leucopenia; Lower urinary tract symptoms (LUTS); Uricacidemia;  Anemia in chronic kidney disease; Aphasia; and Stage 4 chronic kidney disease (HCC) on their problem list. Past Medical History:  has a past medical history of Acute kidney failure with lesion of tubular necrosis (HCC) (08/12/2022), Anemia of chronic renal failure (08/12/2022), Bilateral impacted cerumen (03/25/2021), BPH (benign prostatic hyperplasia) (11/30/2013), Brain bleed (HCC) (05/12/2005), Chronic kidney disease, Chronic kidney disease, stage 4 (severe) (HCC) (02/03/2019), Erectile dysfunction (11/30/2013), Hearing loss (07/28/2022), History of cerebral infarction (09/09/2022), History of cerebrovascular accident (CVA) with residual deficit (02/03/2019), History of CVA (cerebrovascular accident) (02/03/2019), Hypercalcemia (02/03/2019), Hypercholesteremia, Hyperlipidemia (11/07/2015), Hypertension, Orthostatic hypotension (08/12/2022), Pre-transplant evaluation for kidney transplant (11/22/2022), Prediabetes (11/30/2013), Proteinuria (08/12/2022), Secondary hyperparathyroidism, not elsewhere classified (HCC) (08/12/2022), Sensorineural hearing loss (SNHL) of both ears (11/05/2017), Stage 5 chronic kidney disease due to benign hypertension (HCC) (08/12/2022), Stroke (HCC), Vitamin D  deficiency (11/30/2013), and Weight loss (09/26/2022). Past Surgical History:   has a past surgical history that includes Brain surgery (05/12/2005) and Small intestine surgery. Social History:   reports that he quit smoking about 21 years ago. His smoking use included cigarettes. He has never used smokeless tobacco. He reports that he does not drink alcohol and does not use drugs. Family History:  family history includes Cancer in his father and sister; Diabetes in his father and paternal grandfather; Heart disease in his mother; Hypertension in his father; Stroke (age of onset: 15) in his father. Allergies:  has no known allergies.   Medication Reconciliation: Current Outpatient Medications on File Prior to Visit   Medication Sig   amLODipine  (NORVASC ) 10 MG tablet TAKE ONE TABLET BY MOUTH ONCE A DAY   bisoprolol  (ZEBETA ) 5 MG tablet TAKE ONE TABLET BY MOUTH ONCE A DAY   Cholecalciferol  50 MCG (2000 UT) TABS Take by mouth.   cinacalcet  (SENSIPAR ) 30 MG tablet TAKE ONE TABLET BY MOUTH WITH BREAKFAST ON MONDAYS AND FRIDAYS   fluticasone  (FLONASE ) 50 MCG/ACT nasal spray 1 spray each nostril twice a day for 3 days, then reduce to 1 spray each nostril daily or every other day through allergy season.   REPATHA  SURECLICK 140 MG/ML SOAJ INJECT 140MG  INTO THE SKIN ONCE EVERY FOURTEEN DAYS   No current facility-administered medications on file prior to visit.   Medications Discontinued During This Encounter  Medication Reason   tadalafil  (CIALIS ) 20 MG tablet    allopurinol  (ZYLOPRIM ) 100 MG tablet    predniSONE  (DELTASONE ) 20 MG tablet    predniSONE  (DELTASONE ) 20 MG tablet    diclofenac  Sodium (VOLTAREN ) 1 % GEL    predniSONE  (DELTASONE ) 20 MG tablet      Physical Exam:    01/21/2024   10:37 AM 12/23/2023    8:51 AM 12/23/2023    8:21 AM  Vitals with BMI  Height 6' 2.5  6' 2.5  Weight 223 lbs 10 oz  220 lbs 10 oz  BMI 28.33  27.95  Systolic 126 138 849  Diastolic 82 88 96  Pulse 71  70  Vital signs reviewed.  Nursing notes reviewed. Weight trend reviewed. Physical Activity: Sufficiently Active (12/14/2023)   Exercise Vital Sign  Days of Exercise per Week: 2 days    Minutes of Exercise per Session: 110 min   General Appearance:  No acute distress appreciable.   Well-groomed, healthy-appearing male.  Well proportioned with no abnormal fat distribution.  Good muscle tone. Pulmonary:  Normal work of breathing at rest, no respiratory distress apparent. SpO2: 98 %  Musculoskeletal: All extremities are intact.  Neurological:  Awake, alert, oriented, and engaged.  No obvious focal neurological deficits or cognitive impairments.  Sensorium seems unclouded.   Speech is clear and coherent with logical  content. Psychiatric:  Appropriate mood, pleasant and cooperative demeanor, thoughtful and engaged during the exam   Verbalized to patient: Physical Exam MUSCULOSKELETAL: Hand tightness with inability to form a fist.   Results:   Verbalized to patient: Results RADIOLOGY Hand X-ray: No significant findings.     09/08/2023   11:02 AM 09/08/2023    9:59 AM 05/19/2023    9:03 AM 12/11/2022   11:42 AM  PHQ 2/9 Scores  PHQ - 2 Score 0 0 0 0   Office Visit on 01/21/2024  Component Date Value Ref Range Status   Uric Acid, Serum 01/21/2024 8.4 (H)  4.0 - 7.8 mg/dL Final   Sodium 90/88/7974 138  135 - 145 mEq/L Final   Potassium 01/21/2024 3.9  3.5 - 5.1 mEq/L Final   Chloride 01/21/2024 104  96 - 112 mEq/L Final   CO2 01/21/2024 25  19 - 32 mEq/L Final   Glucose, Bld 01/21/2024 90  70 - 99 mg/dL Final   BUN 90/88/7974 35 (H)  6 - 23 mg/dL Final   Creatinine, Ser 01/21/2024 3.64 (H)  0.40 - 1.50 mg/dL Final   GFR 90/88/7974 16.25 (L)  >60.00 mL/min Final   Calcium 01/21/2024 9.9  8.4 - 10.5 mg/dL Final  Scanned Document on 01/07/2024  Component Date Value Ref Range Status   EGFR 01/06/2024 16.0   Final  Office Visit on 12/14/2023  Component Date Value Ref Range Status   Rheumatoid fact SerPl-aCnc 12/14/2023 <10  <14 IU/mL Final   Uric Acid, Serum 12/14/2023 7.1  4.0 - 7.8 mg/dL Final  Office Visit on 09/08/2023  Component Date Value Ref Range Status   Sodium 09/08/2023 140  135 - 145 mEq/L Final   Potassium 09/08/2023 3.8  3.5 - 5.1 mEq/L Final   Chloride 09/08/2023 104  96 - 112 mEq/L Final   CO2 09/08/2023 28  19 - 32 mEq/L Final   Glucose, Bld 09/08/2023 84  70 - 99 mg/dL Final   BUN 95/70/7974 42 (H)  6 - 23 mg/dL Final   Creatinine, Ser 09/08/2023 4.04 (H)  0.40 - 1.50 mg/dL Final   Total Bilirubin 09/08/2023 0.8  0.2 - 1.2 mg/dL Final   Alkaline Phosphatase 09/08/2023 61  39 - 117 U/L Final   AST 09/08/2023 18  0 - 37 U/L Final   ALT 09/08/2023 12  0 - 53 U/L Final    Total Protein 09/08/2023 7.0  6.0 - 8.3 g/dL Final   Albumin 95/70/7974 4.3  3.5 - 5.2 g/dL Final   GFR 95/70/7974 14.38 (LL)  >60.00 mL/min Final   Calcium 09/08/2023 9.3  8.4 - 10.5 mg/dL Final   VITD 95/70/7974 55.26  30.00 - 100.00 ng/mL Final   Uric Acid, Serum 09/08/2023 8.8 (H)  4.0 - 7.8 mg/dL Final   Cholesterol 95/70/7974 141  0 - 200 mg/dL Final   Triglycerides 95/70/7974 85.0  0.0 - 149.0 mg/dL Final   HDL 95/70/7974  51.70  >39.00 mg/dL Final   VLDL 95/70/7974 17.0  0.0 - 40.0 mg/dL Final   LDL Cholesterol 09/08/2023 72  0 - 99 mg/dL Final   Total CHOL/HDL Ratio 09/08/2023 3   Final   NonHDL 09/08/2023 89.46   Final   Magnesium 09/08/2023 2.1  1.5 - 2.5 mg/dL Final   Hgb J8r MFr Bld 09/08/2023 5.9  4.6 - 6.5 % Final   PTH 09/08/2023 225 (H)  16 - 77 pg/mL Final   Creatinine, Urine 09/08/2023 147  20 - 320 mg/dL Final   Protein/Creat Ratio 09/08/2023 639 (H)  25 - 148 mg/g creat Final   Protein/Creatinine Ratio 09/08/2023 0.639 (H)  0.025 - 0.148 mg/mg creat Final   Total Protein, Urine 09/08/2023 94 (H)  5 - 25 mg/dL Final   Color, Urine 95/70/7974 YELLOW  YELLOW Final   APPearance 09/08/2023 CLEAR  CLEAR Final   Specific Gravity, Urine 09/08/2023 1.014  1.001 - 1.035 Final   pH 09/08/2023 6.0  5.0 - 8.0 Final   Glucose, UA 09/08/2023 NEGATIVE  NEGATIVE Final   Bilirubin Urine 09/08/2023 NEGATIVE  NEGATIVE Final   Ketones, ur 09/08/2023 NEGATIVE  NEGATIVE Final   Hgb urine dipstick 09/08/2023 NEGATIVE  NEGATIVE Final   Protein, ur 09/08/2023 2+ (A)  NEGATIVE Final   Nitrites, Initial 09/08/2023 NEGATIVE  NEGATIVE Final   Leukocyte Esterase 09/08/2023 NEGATIVE  NEGATIVE Final   WBC, UA 09/08/2023 NONE SEEN  0 - 5 /HPF Final   RBC / HPF 09/08/2023 NONE SEEN  0 - 2 /HPF Final   Squamous Epithelial / HPF 09/08/2023 NONE SEEN  < OR = 5 /HPF Final   Bacteria, UA 09/08/2023 NONE SEEN  NONE SEEN /HPF Final   Hyaline Cast 09/08/2023 NONE SEEN  NONE SEEN /LPF Final   Note  09/08/2023    Final   PSA 09/08/2023 0.99  0.10 - 4.00 ng/mL Final   INR 09/08/2023 1.1 (H)  0.8 - 1.0 ratio Final   Prothrombin Time 09/08/2023 11.2  9.6 - 13.1 sec Final   Reflexve Urine Culture 09/08/2023    Final  Office Visit on 08/03/2023  Component Date Value Ref Range Status   Rapid Strep A Screen 08/03/2023 Negative  Negative Final  Office Visit on 06/15/2023  Component Date Value Ref Range Status   Influenza A, POC 06/15/2023 Negative  Negative Final   Influenza B, POC 06/15/2023 Negative  Negative Final   SARS Coronavirus 2 Ag 06/15/2023 Negative  Negative Final   Rapid Strep A Screen 06/15/2023 Positive (A)  Negative Final  Office Visit on 12/11/2022  Component Date Value Ref Range Status   Hemoglobin A1C 12/11/2022 5.5  4.0 - 5.6 % Final  Office Visit on 11/07/2022  Component Date Value Ref Range Status   Color, UA 11/07/2022 yellow   Final   Clarity, UA 11/07/2022 clear   Final   Glucose, UA 11/07/2022 Negative  Negative Final   Bilirubin, UA 11/07/2022 negative   Final   Ketones, UA 11/07/2022 negative   Final   Spec Grav, UA 11/07/2022 1.015  1.010 - 1.025 Final   Blood, UA 11/07/2022 negative   Final   pH, UA 11/07/2022 6.0  5.0 - 8.0 Final   Protein, UA 11/07/2022 Positive (A)  Negative Final   Urobilinogen, UA 11/07/2022 0.2  0.2 or 1.0 E.U./dL Final   Nitrite, UA 93/71/7975 negative   Final   Leukocytes, UA 11/07/2022 Negative  Negative Final   WBC 11/07/2022 5.5  4.0 -  10.5 K/uL Final   RBC 11/07/2022 4.61  4.22 - 5.81 Mil/uL Final   Hemoglobin 11/07/2022 11.9 (L)  13.0 - 17.0 g/dL Final   HCT 93/71/7975 37.7 (L)  39.0 - 52.0 % Final   MCV 11/07/2022 81.7  78.0 - 100.0 fl Final   MCHC 11/07/2022 31.7  30.0 - 36.0 g/dL Final   RDW 93/71/7975 13.6  11.5 - 15.5 % Final   Platelets 11/07/2022 224.0  150.0 - 400.0 K/uL Final   Neutrophils Relative % 11/07/2022 53.5  43.0 - 77.0 % Final   Lymphocytes Relative 11/07/2022 31.6  12.0 - 46.0 % Final   Monocytes  Relative 11/07/2022 7.5  3.0 - 12.0 % Final   Eosinophils Relative 11/07/2022 6.8 (H)  0.0 - 5.0 % Final   Basophils Relative 11/07/2022 0.6  0.0 - 3.0 % Final   Neutro Abs 11/07/2022 2.9  1.4 - 7.7 K/uL Final   Lymphs Abs 11/07/2022 1.7  0.7 - 4.0 K/uL Final   Monocytes Absolute 11/07/2022 0.4  0.1 - 1.0 K/uL Final   Eosinophils Absolute 11/07/2022 0.4  0.0 - 0.7 K/uL Final   Basophils Absolute 11/07/2022 0.0  0.0 - 0.1 K/uL Final   Total CK 11/07/2022 214  7 - 232 U/L Final   Sodium 11/07/2022 142  135 - 145 mEq/L Final   Potassium 11/07/2022 4.3  3.5 - 5.1 mEq/L Final   Chloride 11/07/2022 105  96 - 112 mEq/L Final   CO2 11/07/2022 31  19 - 32 mEq/L Final   Glucose, Bld 11/07/2022 93  70 - 99 mg/dL Final   BUN 93/71/7975 35 (H)  6 - 23 mg/dL Final   Creatinine, Ser 11/07/2022 3.53 (H)  0.40 - 1.50 mg/dL Final   Total Bilirubin 11/07/2022 0.7  0.2 - 1.2 mg/dL Final   Alkaline Phosphatase 11/07/2022 64  39 - 117 U/L Final   AST 11/07/2022 17  0 - 37 U/L Final   ALT 11/07/2022 13  0 - 53 U/L Final   Total Protein 11/07/2022 7.1  6.0 - 8.3 g/dL Final   Albumin 93/71/7975 4.5  3.5 - 5.2 g/dL Final   GFR 93/71/7975 17.01 (L)  >60.00 mL/min Final   Calcium 11/07/2022 10.3  8.4 - 10.5 mg/dL Final   Sed Rate 93/71/7975 24 (H)  0 - 20 mm/hr Final  Office Visit on 09/26/2022  Component Date Value Ref Range Status   WBC 09/26/2022 3.8 (L)  4.0 - 10.5 K/uL Final   RBC 09/26/2022 4.46  4.22 - 5.81 Mil/uL Final   Platelets 09/26/2022 193.0  150.0 - 400.0 K/uL Final   Hemoglobin 09/26/2022 11.9 (L)  13.0 - 17.0 g/dL Final   HCT 94/82/7975 36.0 (L)  39.0 - 52.0 % Final   MCV 09/26/2022 80.8  78.0 - 100.0 fl Final   MCHC 09/26/2022 32.9  30.0 - 36.0 g/dL Final   RDW 94/82/7975 13.8  11.5 - 15.5 % Final   Glucose 09/26/2022 90  70 - 99 mg/dL Final   BUN 94/82/7975 48 (H)  8 - 27 mg/dL Final   Creatinine, Ser 09/26/2022 4.67 (H)  0.76 - 1.27 mg/dL Final   eGFR 94/82/7975 13 (L)  >59 mL/min/1.73  Final   BUN/Creatinine Ratio 09/26/2022 10  10 - 24 Final   Sodium 09/26/2022 139  134 - 144 mmol/L Final   Potassium 09/26/2022 4.1  3.5 - 5.2 mmol/L Final   Chloride 09/26/2022 102  96 - 106 mmol/L Final   CO2 09/26/2022 21  20 - 29 mmol/L Final   Calcium 09/26/2022 9.1  8.6 - 10.2 mg/dL Final   Total Protein 94/82/7975 6.4  6.0 - 8.5 g/dL Final   Albumin 94/82/7975 4.5  3.9 - 4.9 g/dL Final   Globulin, Total 09/26/2022 1.9  1.5 - 4.5 g/dL Final   Albumin/Globulin Ratio 09/26/2022 2.4 (H)  1.2 - 2.2 Final   Bilirubin Total 09/26/2022 0.5  0.0 - 1.2 mg/dL Final   Alkaline Phosphatase 09/26/2022 59  44 - 121 IU/L Final   AST 09/26/2022 19  0 - 40 IU/L Final   ALT 09/26/2022 13  0 - 44 IU/L Final   VITD 09/26/2022 45.15  30.00 - 100.00 ng/mL Final   Magnesium 09/26/2022 1.9  1.5 - 2.5 mg/dL Final   TSH 94/82/7975 1.12  0.35 - 5.50 uIU/mL Final   PTH 09/26/2022 295 (H)  16 - 77 pg/mL Final   Color, Urine 09/26/2022 YELLOW  YELLOW Final   APPearance 09/26/2022 CLEAR  CLEAR Final   Specific Gravity, Urine 09/26/2022 1.015  1.001 - 1.035 Final   pH 09/26/2022 5.5  5.0 - 8.0 Final   Glucose, UA 09/26/2022 NEGATIVE  NEGATIVE Final   Bilirubin Urine 09/26/2022 NEGATIVE  NEGATIVE Final   Ketones, ur 09/26/2022 TRACE (A)  NEGATIVE Final   Hgb urine dipstick 09/26/2022 NEGATIVE  NEGATIVE Final   Protein, ur 09/26/2022 1+ (A)  NEGATIVE Final   Nitrite 09/26/2022 NEGATIVE  NEGATIVE Final   Leukocytes,Ua 09/26/2022 NEGATIVE  NEGATIVE Final   WBC, UA 09/26/2022 NONE SEEN  0 - 5 /HPF Final   RBC / HPF 09/26/2022 NONE SEEN  0 - 2 /HPF Final   Squamous Epithelial / HPF 09/26/2022 NONE SEEN  < OR = 5 /HPF Final   Bacteria, UA 09/26/2022 NONE SEEN  NONE SEEN /HPF Final   Hyaline Cast 09/26/2022 NONE SEEN  NONE SEEN /LPF Final   Sed Rate 09/26/2022 9  0 - 20 mm/hr Final   CRP 09/26/2022 <1.0  0.5 - 20.0 mg/dL Final  No image results found. DG Hand Complete Right Result Date: 12/14/2023 CLINICAL  DATA:  pain R index mcp joint and swelling.  h/o gout. EXAM: RIGHT HAND - COMPLETE 3+ VIEW COMPARISON:  None Available. FINDINGS: No acute fracture or dislocation. No erosions. There is no evidence of arthropathy or other focal bone abnormality. Soft tissue swelling about the second digit. No radiopaque foreign body. IMPRESSION: Soft tissue swelling about the second digit. No acute fracture or dislocation. Electronically Signed   By: Rogelia Myers M.D.   On: 12/14/2023 18:01         ASSESSMENT & PLAN   Assessment & Plan Acute gout of right hand, unspecified cause ESRD due to hypertension (HCC) Chronic gout of right hand due to renal impairment   Chronic gout in the right hand presents with persistent pain and swelling, likely from uric acid crystal deposition. Pseudogout is a differential diagnosis. Pain worsens with movement and has persisted for a month. Previous prednisone  courses provided temporary relief. He is currently on allopurinol  100 mg daily and uses diclofenac  gel topically. Colchicine is not an option due to renal impairment. Joint aspiration has not been performed due to a history of gout flares. Continue allopurinol  100 mg daily. Prescribe another prednisone  course for inflammation and pain management. Confirm ice use is permissible. Refer to an orthopedic surgeon for potential joint aspiration and steroid injection. Check uric acid levels to assess the need for adjusting allopurinol  dosage. Send a letter  to the orthopedic surgeon for an earlier appointment. Advise using prednisone  as needed for pain, similar to Tylenol , but ensure the course is completed as prescribed. He is not on dialysis and is monitored by a nephrologist. He is being evaluated for a potential kidney transplant, as early transplantation before dialysis is preferred for better outcomes. Obtain blood work to assess kidney function and uric acid levels. Encourage follow-up with the nephrologist for ongoing management  and transplant evaluation.  Considered colchicine but pain doesn't seem worth renal risk.   ORDER ASSOCIATIONS  #   DIAGNOSIS / CONDITION ICD-10 ENCOUNTER ORDER     ICD-10-CM   1. Acute gout of right hand, unspecified cause  M10.9 Uric acid    predniSONE  (DELTASONE ) 20 MG tablet    Basic metabolic panel with GFR    2. ESRD due to hypertension (HCC)  I12.0    N18.6            Orders Placed in Encounter:   Lab Orders         Uric acid         Basic metabolic panel with GFR      Meds ordered this encounter  Medications   predniSONE  (DELTASONE ) 20 MG tablet    Sig: Take 2 pills for 3 days, 1 pill for 4 days    Dispense:  10 tablet    Refill:  0    Orders Placed This Encounter  Procedures   Uric acid   Basic metabolic panel with GFR        This document was synthesized by artificial intelligence ([employer]) using HIPAA-compliant recording of the clinical interaction;   We discussed the use of AI scribe software for clinical note transcription with the patient, who gave verbal consent to proceed. additional Info: This encounter employed state-of-the-art, real-time, collaborative documentation. The patient actively reviewed and assisted in updating their electronic medical record on a shared screen, ensuring transparency and facilitating joint problem-solving for the problem list, overview, and plan. This approach promotes accurate, informed care. The treatment plan was discussed and reviewed in detail, including medication safety, potential side effects, and all patient questions. We confirmed understanding and comfort with the plan. Follow-up instructions were established, including contacting the office for any concerns, returning if symptoms worsen, persist, or new symptoms develop, and precautions for potential emergency department visits.

## 2024-01-21 NOTE — Patient Instructions (Signed)
 It was a pleasure seeing you today! Your health and satisfaction are our top priorities.  Brent Cone, MD  VISIT SUMMARY: During your visit, we discussed the persistent pain and swelling in your right hand, which has been affecting your daily activities. We reviewed your history of gout and chronic kidney disease, and we discussed the current treatment plan and potential next steps.  YOUR PLAN: -CHRONIC GOUT OF RIGHT HAND DUE TO RENAL IMPAIRMENT: Chronic gout is a condition where uric acid crystals build up in the joints, causing pain and swelling. Your current treatment includes continuing allopurinol  100 mg daily and using diclofenac  gel for pain. We will start another course of prednisone  to manage inflammation and pain. You should use ice if it helps. We are referring you to an orthopedic surgeon for a possible joint aspiration and steroid injection. We will also check your uric acid levels to see if we need to adjust your allopurinol  dosage. Please use prednisone  as needed for pain, but make sure to complete the course as prescribed.  -HYPERTENSIVE CHRONIC KIDNEY DISEASE STAGE 5: Hypertensive chronic kidney disease stage 5 is a severe form of kidney disease caused by high blood pressure, which complicates the management of gout. It is important to continue your current blood pressure medications to prevent further kidney damage.  -CHRONIC KIDNEY DISEASE STAGE 5: Chronic kidney disease stage 5 is the most advanced stage of kidney disease, where the kidneys have very little function left. You are being monitored by a nephrologist and are being evaluated for a potential kidney transplant. We will obtain blood work to assess your kidney function and uric acid levels. Please continue to follow up with your nephrologist for ongoing management and transplant evaluation.  INSTRUCTIONS: Please follow up with the orthopedic surgeon for a potential joint aspiration and steroid injection. We will send a  letter to the orthopedic surgeon to try to get you an earlier appointment. Also, continue to follow up with your nephrologist for ongoing management and transplant evaluation. Make sure to complete the prescribed course of prednisone  and use it as needed for pain.  Your Providers PCP: Brent Jimenez MATSU, MD,  [phone]) Referring Provider: [name], MD,  [phone]) Care Team Provider: Dennise Capri, MD,  [phone]) Care Team Provider: Mollie Nestor CHRISTELLA DEVONNA,  [phone])  NEXT STEPS: [x]  Early Intervention: Schedule sooner appointment, call our on-call services, or go to emergency room if there is any significant Increase in pain or discomfort New or worsening symptoms Sudden or severe changes in your health [x]  Flexible Follow-Up: We recommend a No follow-ups on file. for optimal routine care. This allows for progress monitoring and treatment adjustments. [x]  Preventive Care: Schedule your annual preventive care visit! It's typically covered by insurance and helps identify potential health issues early. [x]  Lab & X-ray Appointments: Incomplete tests scheduled today, or call to schedule. X-rays: [facility] (M-F, 8:30am-noon or 1pm-5pm). [x]  Medical Information Release: Sign a release form at front desk to obtain relevant medical information we don't have.  MAKING THE MOST OF OUR FOCUSED 20 MINUTE APPOINTMENTS: [x]   Clearly state your top concerns at the beginning of the visit to focus our discussion [x]   If you anticipate you will need more time, please inform the front desk during scheduling - we can book multiple appointments in the same week. [x]   If you have transportation problems- use our convenient video appointments or ask about transportation support. [x]   We can get down to business faster if  you use MyChart to update information before the visit and submit non-urgent questions before your visit. Thank you for taking the time to provide  details through MyChart.  Let our nurse know and she can import this information into your encounter documents.  Arrival and Wait Times: [x]   Arriving on time ensures that everyone receives prompt attention. [x]   Early morning (8a) and afternoon (1p) appointments tend to have shortest wait times. [x]   Unfortunately, we cannot delay appointments for late arrivals or hold slots during phone calls.  Getting Answers and Following Up [x]   Simple Questions & Concerns: For quick questions or basic follow-up after your visit, reach us  at [phone] or MyChart messaging. [x]   Complex Concerns: If your concern is more complex, scheduling an appointment might be best. Discuss this with the staff to find the most suitable option. [x]   Lab & Imaging Results: We'll contact you directly if results are abnormal or you don't use MyChart. Most normal results will be on MyChart within 2-3 business days, with a review message from Dr. Jesus. Haven't heard back in 2 weeks? Need results sooner? Contact us  at [phone]. [x]   Referrals: Our referral coordinator will manage specialist referrals. The specialist's office should contact you within 2 weeks to schedule an appointment. Call us  if you haven't heard from them after 2 weeks.  Staying Connected [x]   MyChart: Activate your MyChart for the fastest way to access results and message us . See the last page of this paperwork for instructions on how to activate.  Bring to Your Next Appointment [x]   Medications: Please bring all your medication bottles to your next appointment to ensure we have an accurate record of your prescriptions. [x]   Health Diaries: If you're monitoring any health conditions at home, keeping a diary of your readings can be very helpful for discussions at your next appointment.  Billing [x]   X-ray & Lab Orders: These are billed by separate companies. Contact the invoicing company directly for questions or concerns. [x]   Visit Charges:  Discuss any billing inquiries with our administrative services team.  Your Satisfaction Matters [x]   Share Your Experience: We strive for your satisfaction! If you have any complaints, or preferably compliments, please let Dr. Jesus know directly or contact our Practice Administrators, Manuelita Rubin or [employer], by asking at the front desk.   Reviewing Your Records [x]   Review this early draft of your clinical encounter notes below and the final encounter summary tomorrow on MyChart after its been completed.  All orders placed so far are visible here: Acute gout of right hand, unspecified cause -     Uric acid -     predniSONE ; Take 2 pills for 3 days, 1 pill for 4 days  Dispense: 10 tablet; Refill: 0 -     Basic metabolic panel with GFR  ESRD due to hypertension (HCC)

## 2024-01-24 ENCOUNTER — Ambulatory Visit: Payer: Self-pay | Admitting: Internal Medicine

## 2024-01-24 NOTE — Progress Notes (Signed)
Reviewed labs, sent message

## 2024-01-25 ENCOUNTER — Other Ambulatory Visit: Payer: Self-pay | Admitting: Internal Medicine

## 2024-01-25 DIAGNOSIS — I6930 Unspecified sequelae of cerebral infarction: Secondary | ICD-10-CM

## 2024-01-25 DIAGNOSIS — E7849 Other hyperlipidemia: Secondary | ICD-10-CM

## 2024-01-29 ENCOUNTER — Ambulatory Visit (INDEPENDENT_AMBULATORY_CARE_PROVIDER_SITE_OTHER): Admitting: Surgical

## 2024-01-29 ENCOUNTER — Other Ambulatory Visit: Payer: Self-pay

## 2024-01-29 ENCOUNTER — Encounter: Payer: Self-pay | Admitting: Surgical

## 2024-01-29 DIAGNOSIS — M109 Gout, unspecified: Secondary | ICD-10-CM | POA: Diagnosis not present

## 2024-01-29 DIAGNOSIS — M79644 Pain in right finger(s): Secondary | ICD-10-CM

## 2024-01-29 MED ORDER — TRIAMCINOLONE ACETONIDE 40 MG/ML IJ SUSP
20.0000 mg | INTRAMUSCULAR | Status: AC | PRN
  Administered 2024-01-29: 20 mg via INTRA_ARTICULAR

## 2024-01-29 MED ORDER — LIDOCAINE HCL 1 % IJ SOLN
3.0000 mL | INTRAMUSCULAR | Status: AC | PRN
  Administered 2024-01-29: 3 mL

## 2024-01-29 MED ORDER — BUPIVACAINE HCL 0.25 % IJ SOLN
0.5000 mL | INTRAMUSCULAR | Status: AC | PRN
  Administered 2024-01-29: .5 mL via INTRA_ARTICULAR
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
## 2024-01-30 ENCOUNTER — Other Ambulatory Visit: Payer: Self-pay | Admitting: Internal Medicine

## 2024-01-30 DIAGNOSIS — I10 Essential (primary) hypertension: Secondary | ICD-10-CM

## 2024-01-30 LAB — SYNOVIAL FLUID ANALYSIS, COMPLETE
Basophils, %: 0 %
Eosinophils-Synovial: 0 % (ref 0–2)
Lymphocytes-Synovial Fld: 16 % (ref 0–74)
Monocyte/Macrophage: 80 % — ABNORMAL HIGH (ref 0–69)
Neutrophil, Synovial: 4 % (ref 0–24)
Synoviocytes, %: 0 % (ref 0–15)
WBC, Synovial: 409 {cells}/uL — ABNORMAL HIGH (ref <150)

## 2024-02-01 ENCOUNTER — Ambulatory Visit: Admitting: Surgical

## 2024-02-18 ENCOUNTER — Other Ambulatory Visit: Payer: Self-pay | Admitting: Internal Medicine

## 2024-02-18 DIAGNOSIS — I6930 Unspecified sequelae of cerebral infarction: Secondary | ICD-10-CM

## 2024-02-18 DIAGNOSIS — E7849 Other hyperlipidemia: Secondary | ICD-10-CM

## 2024-02-19 ENCOUNTER — Ambulatory Visit: Admitting: Surgical

## 2024-02-25 ENCOUNTER — Other Ambulatory Visit: Payer: Self-pay | Admitting: Internal Medicine

## 2024-02-25 DIAGNOSIS — I129 Hypertensive chronic kidney disease with stage 1 through stage 4 chronic kidney disease, or unspecified chronic kidney disease: Secondary | ICD-10-CM | POA: Diagnosis not present

## 2024-02-25 DIAGNOSIS — Z87891 Personal history of nicotine dependence: Secondary | ICD-10-CM | POA: Diagnosis not present

## 2024-02-25 DIAGNOSIS — N2581 Secondary hyperparathyroidism of renal origin: Secondary | ICD-10-CM | POA: Diagnosis not present

## 2024-02-25 DIAGNOSIS — Z8673 Personal history of transient ischemic attack (TIA), and cerebral infarction without residual deficits: Secondary | ICD-10-CM | POA: Diagnosis not present

## 2024-02-25 DIAGNOSIS — M109 Gout, unspecified: Secondary | ICD-10-CM | POA: Diagnosis not present

## 2024-02-25 DIAGNOSIS — E785 Hyperlipidemia, unspecified: Secondary | ICD-10-CM | POA: Diagnosis not present

## 2024-02-25 DIAGNOSIS — N184 Chronic kidney disease, stage 4 (severe): Secondary | ICD-10-CM | POA: Diagnosis not present

## 2024-02-25 DIAGNOSIS — J301 Allergic rhinitis due to pollen: Secondary | ICD-10-CM | POA: Diagnosis not present

## 2024-02-25 DIAGNOSIS — Z79899 Other long term (current) drug therapy: Secondary | ICD-10-CM | POA: Diagnosis not present

## 2024-02-27 ENCOUNTER — Other Ambulatory Visit: Payer: Self-pay | Admitting: Internal Medicine

## 2024-02-27 DIAGNOSIS — M109 Gout, unspecified: Secondary | ICD-10-CM

## 2024-02-29 ENCOUNTER — Other Ambulatory Visit: Payer: Self-pay | Admitting: Internal Medicine

## 2024-02-29 DIAGNOSIS — I10 Essential (primary) hypertension: Secondary | ICD-10-CM

## 2024-03-02 IMAGING — US US RENAL
1 series · 14 of 25 positions shown · non-contrast
Comparison: Renal ultrasound July 14, 2018

CLINICAL DATA: Chronic renal disease.

EXAM:
RENAL / URINARY TRACT ULTRASOUND COMPLETE

[Series 1: us renal · 14 of 48 slices shown]
[im 1/48]
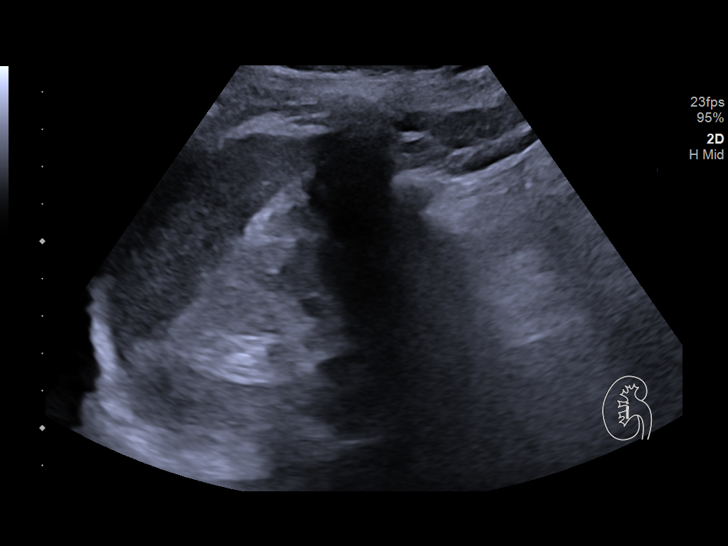
[im 4/48]
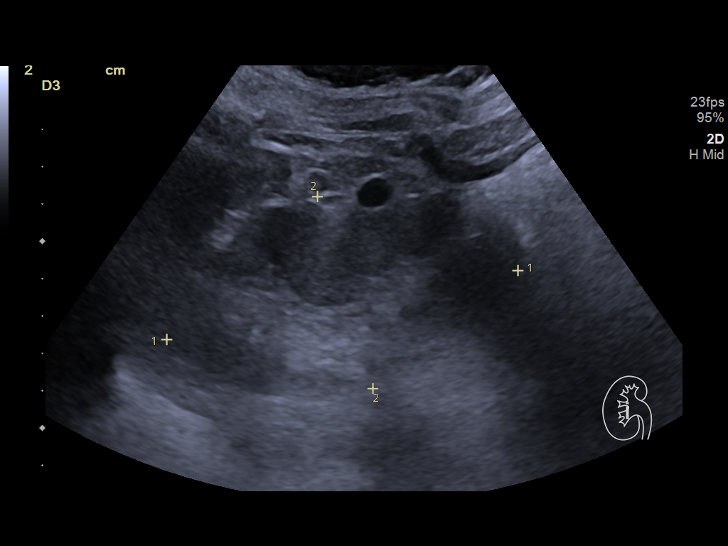
[im 8/48]
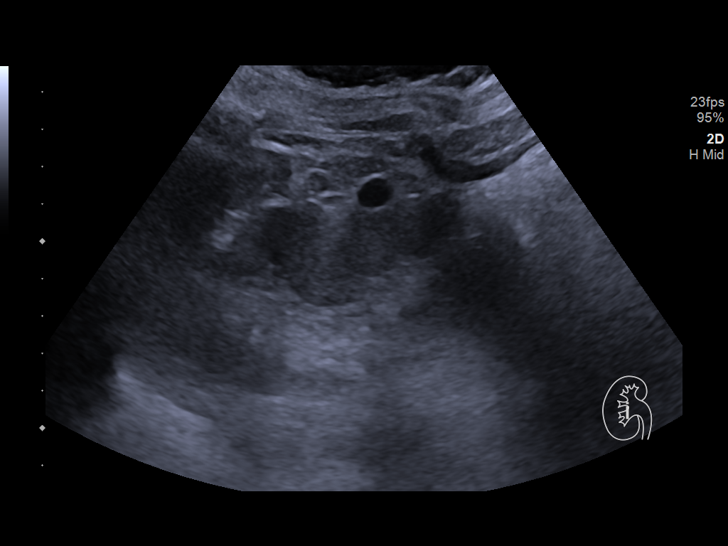
[im 12/48]
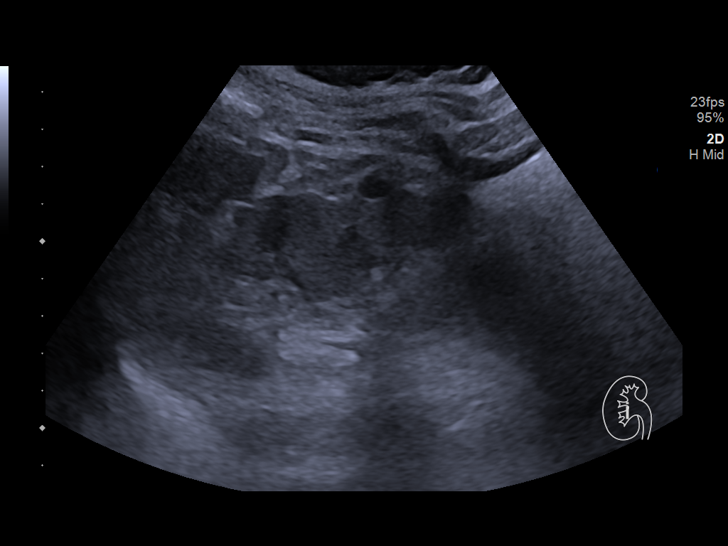
[im 16/48]
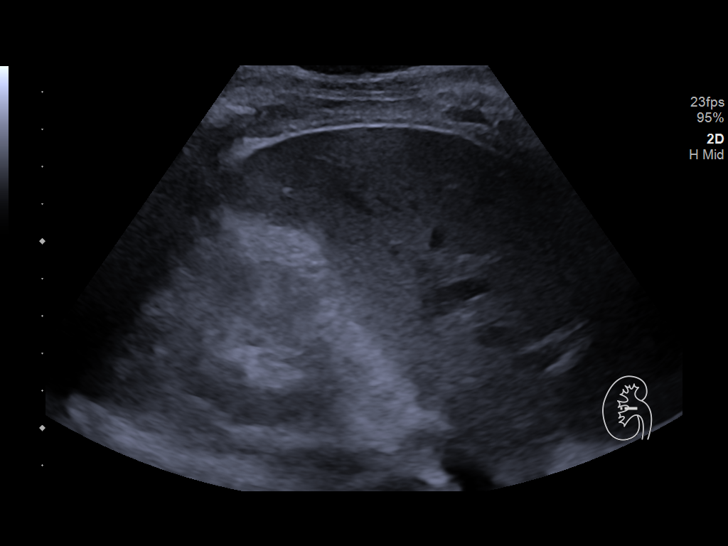
[im 18/48]
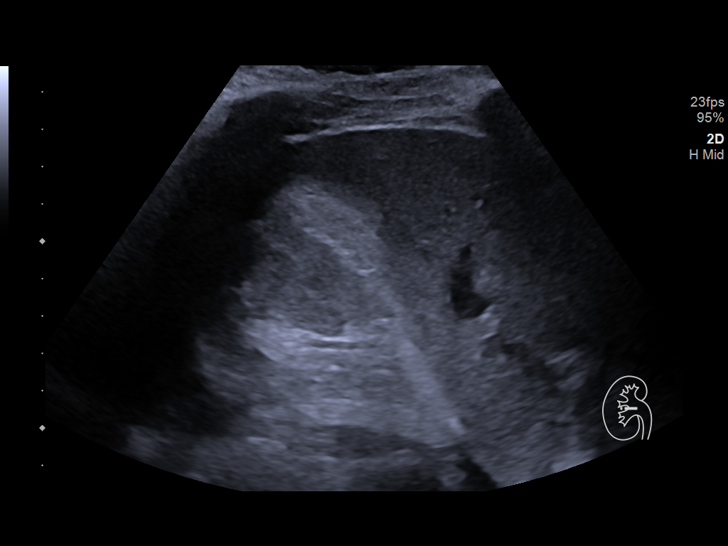
[im 22/48]
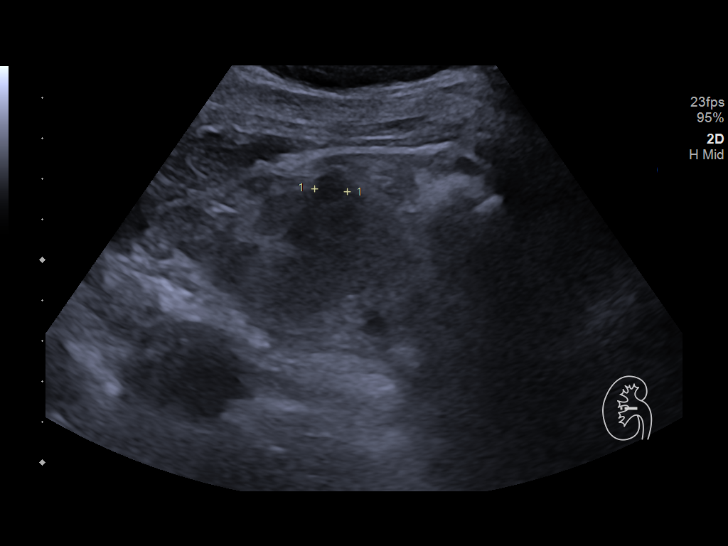
[im 26/48]
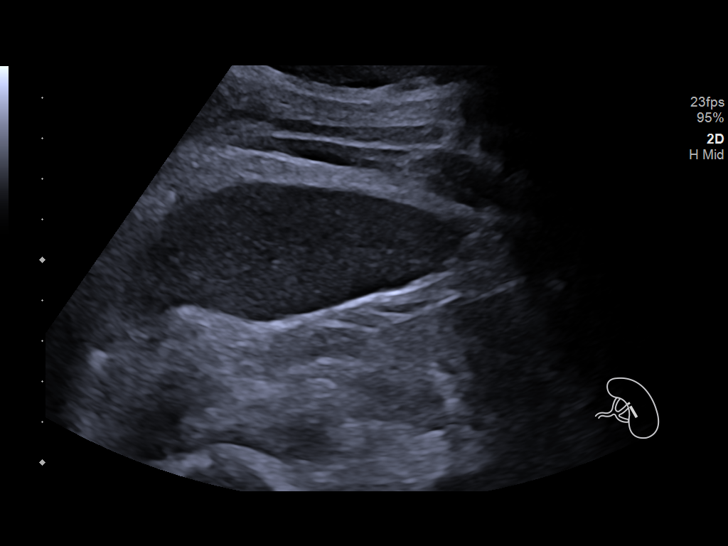
[im 30/48]
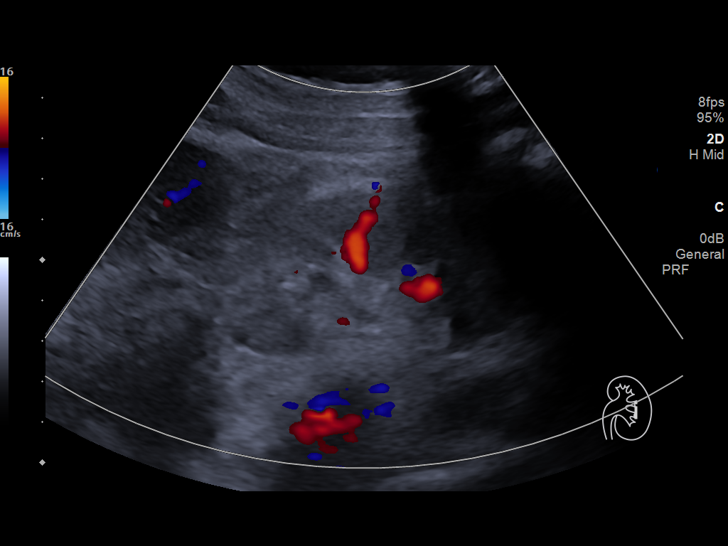
[im 32/48]
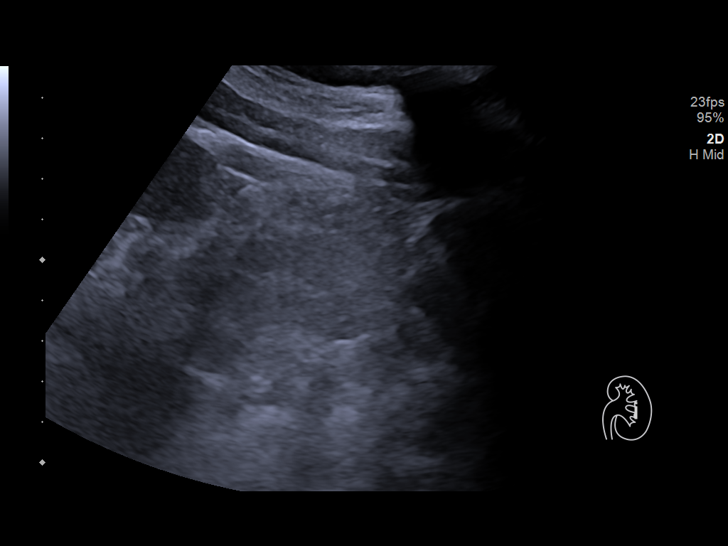
[im 36/48]
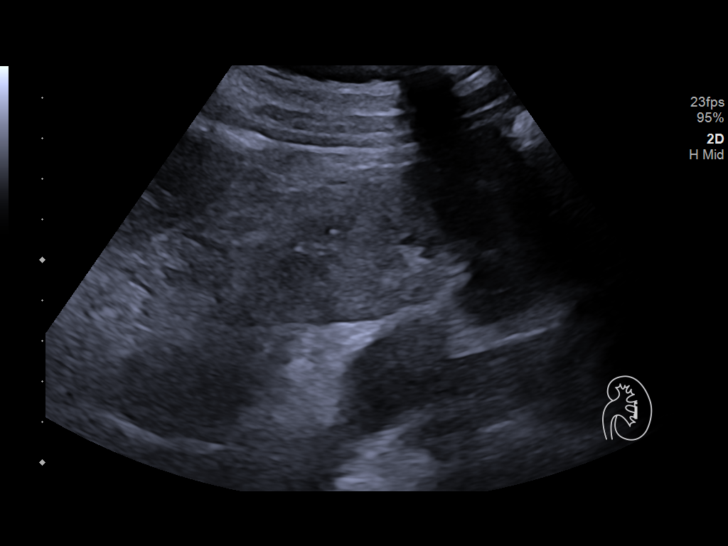
[im 40/48]
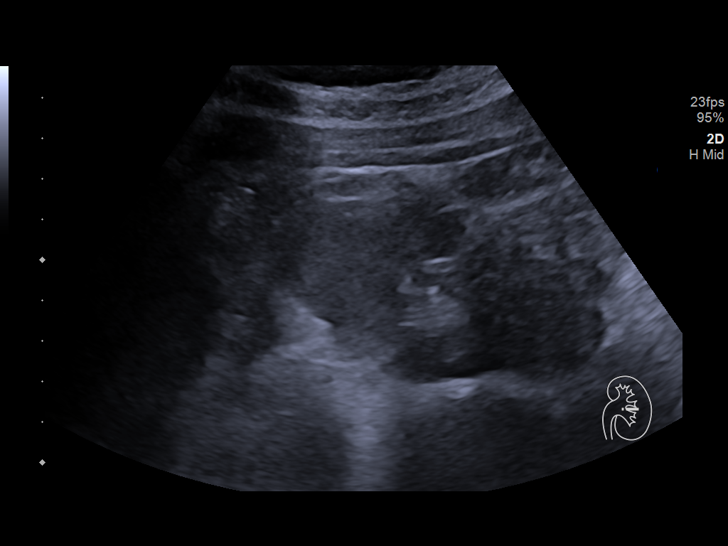
[im 44/48]
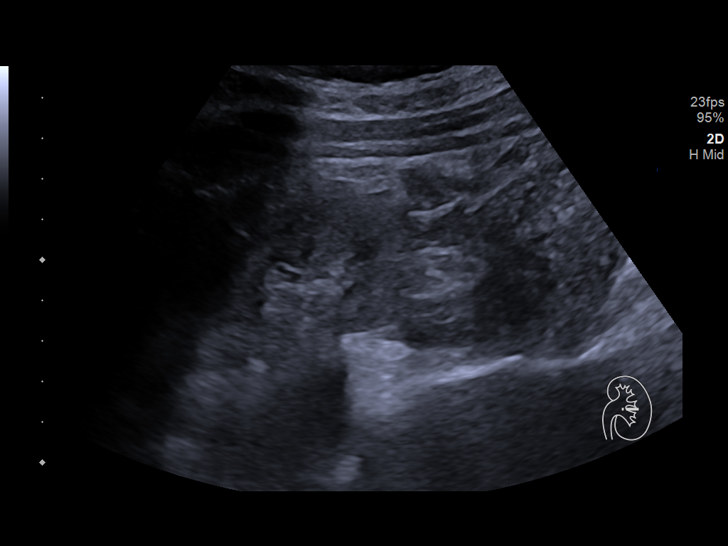
[im 48/48]
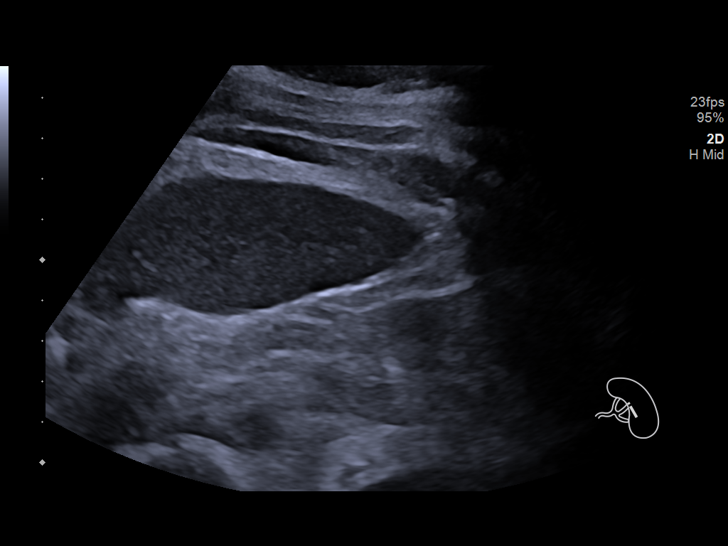

[14 of 25 positions shown; findings below may reference images not displayed]

FINDINGS: Right Kidney:

Renal measurements: 9.6 x 5.4 x 4.4 cm = volume: 118.4 mL. Increased
renal cortical echogenicity. Normal renal cortical thickness. No
hydronephrosis. There is a subcentimeter hypoechoic lesion favored
to represent a small cyst, too small to characterize.

Left Kidney:

Renal measurements: 8.6 x 4.5 x 4.6 cm = volume: 92.2 mL. Increased
cortical echogenicity. Normal cortical thickness. No hydronephrosis
or mass.

Bladder:

Appears normal for degree of bladder distention.

Other:

None.
IMPRESSION: Increased renal cortical echogenicity compatible with chronic
medical renal disease.

No hydronephrosis.

## 2024-03-07 ENCOUNTER — Encounter: Payer: Self-pay | Admitting: Internal Medicine

## 2024-03-14 ENCOUNTER — Encounter: Payer: Self-pay | Admitting: Radiology

## 2024-03-16 ENCOUNTER — Other Ambulatory Visit: Payer: Self-pay | Admitting: Internal Medicine

## 2024-03-16 DIAGNOSIS — M40209 Unspecified kyphosis, site unspecified: Secondary | ICD-10-CM

## 2024-03-21 ENCOUNTER — Other Ambulatory Visit: Payer: Self-pay | Admitting: Internal Medicine

## 2024-03-21 DIAGNOSIS — I6930 Unspecified sequelae of cerebral infarction: Secondary | ICD-10-CM

## 2024-03-21 DIAGNOSIS — E7849 Other hyperlipidemia: Secondary | ICD-10-CM

## 2024-03-25 ENCOUNTER — Other Ambulatory Visit: Payer: Self-pay | Admitting: Internal Medicine

## 2024-03-25 DIAGNOSIS — I10 Essential (primary) hypertension: Secondary | ICD-10-CM

## 2024-03-30 DIAGNOSIS — N185 Chronic kidney disease, stage 5: Secondary | ICD-10-CM | POA: Diagnosis not present

## 2024-04-05 DIAGNOSIS — N184 Chronic kidney disease, stage 4 (severe): Secondary | ICD-10-CM | POA: Diagnosis not present

## 2024-04-05 DIAGNOSIS — N2581 Secondary hyperparathyroidism of renal origin: Secondary | ICD-10-CM | POA: Diagnosis not present

## 2024-04-05 DIAGNOSIS — I129 Hypertensive chronic kidney disease with stage 1 through stage 4 chronic kidney disease, or unspecified chronic kidney disease: Secondary | ICD-10-CM | POA: Diagnosis not present

## 2024-04-05 DIAGNOSIS — D631 Anemia in chronic kidney disease: Secondary | ICD-10-CM | POA: Diagnosis not present

## 2024-04-20 ENCOUNTER — Other Ambulatory Visit: Payer: Self-pay | Admitting: Internal Medicine

## 2024-04-20 DIAGNOSIS — I6930 Unspecified sequelae of cerebral infarction: Secondary | ICD-10-CM

## 2024-04-20 DIAGNOSIS — E7849 Other hyperlipidemia: Secondary | ICD-10-CM

## 2024-04-24 ENCOUNTER — Other Ambulatory Visit: Payer: Self-pay | Admitting: Internal Medicine

## 2024-04-24 DIAGNOSIS — I10 Essential (primary) hypertension: Secondary | ICD-10-CM

## 2024-04-26 ENCOUNTER — Ambulatory Visit

## 2024-04-26 ENCOUNTER — Ambulatory Visit: Admitting: Internal Medicine

## 2024-04-26 ENCOUNTER — Encounter: Payer: Self-pay | Admitting: Internal Medicine

## 2024-04-26 VITALS — BP 136/86 | HR 68 | Temp 98.0°F | Ht 74.5 in | Wt 218.8 lb

## 2024-04-26 DIAGNOSIS — N184 Chronic kidney disease, stage 4 (severe): Secondary | ICD-10-CM

## 2024-04-26 DIAGNOSIS — E7849 Other hyperlipidemia: Secondary | ICD-10-CM

## 2024-04-26 DIAGNOSIS — R7303 Prediabetes: Secondary | ICD-10-CM | POA: Diagnosis not present

## 2024-04-26 DIAGNOSIS — R053 Chronic cough: Secondary | ICD-10-CM

## 2024-04-26 DIAGNOSIS — I10 Essential (primary) hypertension: Secondary | ICD-10-CM

## 2024-04-26 DIAGNOSIS — I129 Hypertensive chronic kidney disease with stage 1 through stage 4 chronic kidney disease, or unspecified chronic kidney disease: Secondary | ICD-10-CM | POA: Insufficient documentation

## 2024-04-26 DIAGNOSIS — T7840XD Allergy, unspecified, subsequent encounter: Secondary | ICD-10-CM

## 2024-04-26 DIAGNOSIS — I6930 Unspecified sequelae of cerebral infarction: Secondary | ICD-10-CM

## 2024-04-26 LAB — MICROALBUMIN / CREATININE URINE RATIO
Creatinine,U: 78 mg/dL
Microalb Creat Ratio: 642.6 mg/g — ABNORMAL HIGH (ref 0.0–30.0)
Microalb, Ur: 50.1 mg/dL — ABNORMAL HIGH (ref 0.7–1.9)

## 2024-04-26 LAB — COMPREHENSIVE METABOLIC PANEL WITH GFR
ALT: 11 U/L (ref 3–53)
AST: 16 U/L (ref 5–37)
Albumin: 4.3 g/dL (ref 3.5–5.2)
Alkaline Phosphatase: 67 U/L (ref 39–117)
BUN: 27 mg/dL — ABNORMAL HIGH (ref 6–23)
CO2: 27 meq/L (ref 19–32)
Calcium: 10.1 mg/dL (ref 8.4–10.5)
Chloride: 106 meq/L (ref 96–112)
Creatinine, Ser: 3.39 mg/dL — ABNORMAL HIGH (ref 0.40–1.50)
GFR: 17.67 mL/min — ABNORMAL LOW (ref >60.00)
Glucose, Bld: 98 mg/dL (ref 70–99)
Potassium: 3.7 meq/L (ref 3.5–5.1)
Sodium: 142 meq/L (ref 135–145)
Total Bilirubin: 0.7 mg/dL (ref 0.2–1.2)
Total Protein: 7.1 g/dL (ref 6.0–8.3)

## 2024-04-26 LAB — VITAMIN D 25 HYDROXY (VIT D DEFICIENCY, FRACTURES): VITD: 57.87 ng/mL (ref 30.00–100.00)

## 2024-04-26 LAB — LIPID PANEL
Cholesterol: 100 mg/dL (ref 28–200)
HDL: 37.6 mg/dL — ABNORMAL LOW (ref >39.00)
LDL Cholesterol: 46 mg/dL (ref 10–99)
NonHDL: 62.39
Total CHOL/HDL Ratio: 3
Triglycerides: 80 mg/dL (ref 10.0–149.0)
VLDL: 16 mg/dL (ref 0.0–40.0)

## 2024-04-26 LAB — CBC WITH DIFFERENTIAL/PLATELET
Basophils Absolute: 0 K/uL (ref 0.0–0.1)
Basophils Relative: 0.6 % (ref 0.0–3.0)
Eosinophils Absolute: 0.4 K/uL (ref 0.0–0.7)
Eosinophils Relative: 6.5 % — ABNORMAL HIGH (ref 0.0–5.0)
HCT: 38.7 % — ABNORMAL LOW (ref 39.0–52.0)
Hemoglobin: 12.9 g/dL — ABNORMAL LOW (ref 13.0–17.0)
Lymphocytes Relative: 34.4 % (ref 12.0–46.0)
Lymphs Abs: 1.9 K/uL (ref 0.7–4.0)
MCHC: 33.3 g/dL (ref 30.0–36.0)
MCV: 80.2 fl (ref 78.0–100.0)
Monocytes Absolute: 0.3 K/uL (ref 0.1–1.0)
Monocytes Relative: 5.6 % (ref 3.0–12.0)
Neutro Abs: 3 K/uL (ref 1.4–7.7)
Neutrophils Relative %: 52.9 % (ref 43.0–77.0)
Platelets: 305 K/uL (ref 150.0–400.0)
RBC: 4.83 Mil/uL (ref 4.22–5.81)
RDW: 13.8 % (ref 11.5–15.5)
WBC: 5.6 K/uL (ref 4.0–10.5)

## 2024-04-26 LAB — PHOSPHORUS: Phosphorus: 2.9 mg/dL (ref 2.3–4.6)

## 2024-04-26 LAB — URIC ACID: Uric Acid, Serum: 8.9 mg/dL — ABNORMAL HIGH (ref 4.0–7.8)

## 2024-04-26 LAB — HEMOGLOBIN A1C: Hgb A1c MFr Bld: 5.8 % (ref 4.6–6.5)

## 2024-04-26 MED ORDER — BENZONATATE 200 MG PO CAPS: 200.0000 mg | ORAL_CAPSULE | Freq: Three times a day (TID) | ORAL | 0 refills | Status: AC | PRN

## 2024-04-26 MED ORDER — REPATHA SURECLICK 140 MG/ML ~~LOC~~ SOAJ: 140.0000 mg | SUBCUTANEOUS | 4 refills | Status: AC

## 2024-04-26 MED ORDER — BISOPROLOL FUMARATE 10 MG PO TABS: 10.0000 mg | ORAL_TABLET | Freq: Every day | ORAL | 4 refills | Status: AC

## 2024-04-26 MED ORDER — LEVOCETIRIZINE DIHYDROCHLORIDE 5 MG PO TABS: 5.0000 mg | ORAL_TABLET | Freq: Every evening | ORAL | 3 refills | Status: AC

## 2024-04-26 MED ORDER — AMLODIPINE BESYLATE 10 MG PO TABS: 10.0000 mg | ORAL_TABLET | Freq: Every day | ORAL | 2 refills | Status: AC

## 2024-04-26 NOTE — Assessment & Plan Note (Signed)
 Hyperlipidemia is managed with Repatha . Recent cholesterol levels require assessment to ensure treatment efficacy. Blood work has been ordered to check cholesterol levels.

## 2024-04-26 NOTE — Assessment & Plan Note (Signed)
 Prediabetes has stable A1c levels over the past 15 years. Recent A1c levels need assessment to monitor the condition. An A1c test has been ordered to monitor prediabetes status.

## 2024-04-26 NOTE — Assessment & Plan Note (Signed)
 Chronic kidney disease stage 4 with hypertensive nephrosclerosis   Chronic kidney disease stage 4 with hypertensive nephrosclerosis has been well-managed for ten years. Kidney function remains stable but is close to the dialysis threshold. Blood pressure control is crucial to prevent further kidney damage, with recent readings above 130 mmHg. Antihypertensive therapy has been adjusted by increasing bisoprolol  to 10 mg daily. ACE inhibitors or ARBs are avoided due to advanced kidney disease. Cinacalcet  30 mg twice weekly continues as per the kidney doctor's plan. Ongoing management is coordinated with the kidney doctor.

## 2024-04-26 NOTE — Patient Instructions (Signed)
 It was a pleasure seeing you today! Your health and satisfaction are our top priorities.  Bernardino Cone, MD  VISIT SUMMARY: Today, we discussed your blood pressure management and evaluated your persistent cough. We reviewed your history of stage four kidney disease, which has been stable for ten years, and addressed your current medications. We also discussed your chronic cough, which is particularly bothersome due to your work conditions, and your interest in refilling your allergy medication. Additionally, we reviewed your prediabetes status and ordered necessary tests to monitor your conditions.  YOUR PLAN: -CHRONIC KIDNEY DISEASE STAGE 4 WITH HYPERTENSIVE NEPHROSCLEROSIS: This is a severe form of kidney disease caused by high blood pressure damaging the kidneys. Your kidney function remains stable but close to the dialysis threshold. We have increased your bisoprolol  to 10 mg daily to better control your blood pressure and prevent further kidney damage. Continue taking cinacalcet  30 mg twice weekly as per your kidney doctor's plan.  -ESSENTIAL HYPERTENSION: This is high blood pressure that requires management to prevent complications. Your recent blood pressure readings have been above 130 mmHg. We have increased your bisoprolol  to 10 mg daily to help achieve a target blood pressure under 130 mmHg.  -HYPERLIPIDEMIA: This is high cholesterol, which can increase the risk of heart disease. You are currently managing this with Repatha . We have ordered blood work to check your cholesterol levels and ensure the treatment is effective.  -CHRONIC COUGH: This is a persistent cough that may be worsened by environmental factors like working in cold conditions. We have ordered a chest x-ray to evaluate your lung condition and prescribed Tessalon  Perles (benzonatate ) to help manage your cough.  -ALLERGIC RHINITIS: This is an allergic reaction that causes sneezing, runny nose, and other similar symptoms. We have  prescribed levocetirizine to help manage your allergy symptoms.  -PREDIABETES: This is a condition where blood sugar levels are higher than normal but not high enough to be classified as diabetes. Your A1c levels have been stable over the past 15 years. We have ordered an A1c test to monitor your condition.  INSTRUCTIONS: Please follow up with the ordered blood work and chest x-ray. Continue taking your medications as prescribed, and monitor your blood pressure regularly. Schedule a follow-up appointment to review the results of your tests and discuss any further adjustments to your treatment plan.  Your Providers PCP: Cone Bernardino MATSU, MD,  [phone]) Referring Provider: Cone Bernardino MATSU, MD,  [phone]) Care Team Provider: Dennise Capri, MD,  [phone]) Care Team Provider: Mollie Nestor CHRISTELLA DEVONNA,  [phone]) Care Team Provider: Marlee Bernardino NOVAK, MD,  [phone])  NEXT STEPS: [x]  Early Intervention: Schedule sooner appointment, call our on-call services, or go to emergency room if there is any significant Increase in pain or discomfort New or worsening symptoms Sudden or severe changes in your health [x]  Flexible Follow-Up: We recommend a No follow-ups on file. for optimal routine care. This allows for progress monitoring and treatment adjustments. [x]  Preventive Care: Schedule your annual preventive care visit! It's typically covered by insurance and helps identify potential health issues early. [x]  Lab & X-ray Appointments: Incomplete tests scheduled today, or call to schedule. X-rays: [facility] (M-F, 8:30am-noon or 1pm-5pm). [x]  Medical Information Release: Sign a release form at front desk to obtain relevant medical information we don't have.  MAKING THE MOST OF OUR FOCUSED 20 MINUTE APPOINTMENTS: [x]   Clearly state your top concerns at the beginning of the visit to focus our discussion [x]   If you  anticipate you will need more time, please  inform the front desk during scheduling - we can book multiple appointments in the same week. [x]   If you have transportation problems- use our convenient video appointments or ask about transportation support. [x]   We can get down to business faster if you use MyChart to update information before the visit and submit non-urgent questions before your visit. Thank you for taking the time to provide details through MyChart.  Let our nurse know and she can import this information into your encounter documents.  Arrival and Wait Times: [x]   Arriving on time ensures that everyone receives prompt attention. [x]   Early morning (8a) and afternoon (1p) appointments tend to have shortest wait times. [x]   Unfortunately, we cannot delay appointments for late arrivals or hold slots during phone calls.  Getting Answers and Following Up [x]   Simple Questions & Concerns: For quick questions or basic follow-up after your visit, reach us  at [phone] or MyChart messaging. [x]   Complex Concerns: If your concern is more complex, scheduling an appointment might be best. Discuss this with the staff to find the most suitable option. [x]   Lab & Imaging Results: We'll contact you directly if results are abnormal or you don't use MyChart. Most normal results will be on MyChart within 2-3 business days, with a review message from Dr. Jesus. Haven't heard back in 2 weeks? Need results sooner? Contact us  at [phone]. [x]   Referrals: Our referral coordinator will manage specialist referrals. The specialist's office should contact you within 2 weeks to schedule an appointment. Call us  if you haven't heard from them after 2 weeks.  Staying Connected [x]   MyChart: Activate your MyChart for the fastest way to access results and message us . See the last page of this paperwork for instructions on how to activate.  Bring to Your Next Appointment [x]   Medications: Please bring all your medication bottles to your next  appointment to ensure we have an accurate record of your prescriptions. [x]   Health Diaries: If you're monitoring any health conditions at home, keeping a diary of your readings can be very helpful for discussions at your next appointment.  Billing [x]   X-ray & Lab Orders: These are billed by separate companies. Contact the invoicing company directly for questions or concerns. [x]   Visit Charges: Discuss any billing inquiries with our administrative services team.  Your Satisfaction Matters [x]   Share Your Experience: We strive for your satisfaction! If you have any complaints, or preferably compliments, please let Dr. Jesus know directly or contact our Practice Administrators, Manuelita Rubin or [employer], by asking at the front desk.   Reviewing Your Records [x]   Review this early draft of your clinical encounter notes below and the final encounter summary tomorrow on MyChart after its been completed.  All orders placed so far are visible here: Ischemic nephropathy with hypertensive nephrosclerosis, stage 1 through stage 4 or unspecified chronic kidney disease -     CBC with Differential/Platelet -     Comprehensive metabolic panel with GFR -     Lipid panel -     Hemoglobin A1c -     Microalbumin / creatinine urine ratio -     VITAMIN D  25 Hydroxy (Vit-D Deficiency, Fractures) -     Phosphorus -     Uric acid -     Parathyroid  hormone, intact (no Ca)  Essential hypertension, benign -     amLODIPine  Besylate; Take 1 tablet (10 mg total) by mouth  daily.  Dispense: 90 tablet; Refill: 2 -     Bisoprolol  Fumarate; Take 1 tablet (10 mg total) by mouth daily.  Dispense: 90 tablet; Refill: 4  Other hyperlipidemia -     Repatha  SureClick; Inject 140 mg into the skin every 14 (fourteen) days.  Dispense: 6 mL; Refill: 4  History of cerebrovascular accident (CVA) with residual deficit -     Repatha  SureClick; Inject 140 mg into the skin every 14 (fourteen) days.  Dispense: 6 mL; Refill:  4 -     Lipid panel  Chronic cough -     Benzonatate ; Take 1 capsule (200 mg total) by mouth 3 (three) times daily as needed for cough.  Dispense: 90 capsule; Refill: 0 -     DG Chest 2 View; Future  Allergy, subsequent encounter -     Levocetirizine Dihydrochloride ; Take 1 tablet (5 mg total) by mouth every evening.  Dispense: 90 tablet; Refill: 3  Prediabetes -     Hemoglobin A1c  Chronic kidney disease (CKD), active medical management without dialysis, stage 4 (severe) (HCC) -     Microalbumin / creatinine urine ratio -     VITAMIN D  25 Hydroxy (Vit-D Deficiency, Fractures)

## 2024-04-26 NOTE — Progress Notes (Signed)
 ==============================  [facility]: [phone]   -- Medical Office Visit --  Patient: Brent Jimenez      Age: 68 y.o.       Sex:  male  Date:   04/26/2024 Today's Healthcare Provider: Bernardino KANDICE Cone, MD  ==============================   Chief Complaint: Cough (Had for couple weeks getting better at times) and Labs Only  Discussed the use of AI scribe software for clinical note transcription with the patient, who gave verbal consent to proceed.  History of Present Illness 68 year old male with stage four kidney disease who presents for blood pressure management and evaluation of a persistent cough.  He has a history of stage four kidney disease, stable for ten years, not currently on a transplant list. He makes urine daily and maintains hydration. He has been taking amlodipine , bisoprolol , and Repatha  for blood pressure management. No dizziness upon standing, but occasional dizziness when moving quickly from bed. He is not on an ACE inhibitor or ARB due to his kidney condition. He takes cinacalcet  twice weekly for calcium management.  He experiences a persistent cough, particularly bothersome due to working at night in cold conditions. Previously had a severe cough that improved with prescribed cough medicine, which he found effective and desires to have on hand. No issues with nocturia or frequent urination at night.  His kidney disease was first identified in 2007 during hospitalization. He has been under the care of Dr. Bernardino Gasman for his kidney disease.  He is currently taking amlodipine , bisoprolol , Repatha , cinacalcet , and possibly levocetirizine for allergies, which he is interested in refilling. His caregiver notes he juices frequently and consumes berries, contributing to his health maintenance.  Pulse Readings from Last 3 Encounters:  04/26/24 68  01/21/24 71  12/23/23 70   Lab Results  Component Value Date   HGBA1C 5.8  04/26/2024   HGBA1C 5.9 09/08/2023   HGBA1C 5.5 12/11/2022   HGBA1C 5.7 03/20/2021   HGBA1C 5.8 06/09/2018   HGBA1C 6.0 05/13/2017   HGBA1C 6.0 (H) 09/14/2014   HGBA1C 6.1 (H) 11/30/2013   HGBA1C 6.1 (H) 06/15/2012   HGBA1C 6.0 (H) 10/23/2009   Background Reviewed: Problem List: has Hypertension; BPH (benign prostatic hyperplasia); Prediabetes; Vitamin D  deficiency; Erectile dysfunction; Hyperlipidemia; Sensorineural hearing loss (SNHL) of both ears; ESRD due to hypertension (HCC); History of cerebrovascular accident (CVA) with residual deficit; Hearing loss; Secondary hyperparathyroidism; Proteinuria; Anemia of chronic renal failure; Orthostatic hypotension; Kyphosis; History of colon polyps; Leucopenia; Lower urinary tract symptoms (LUTS); Uricacidemia; Anemia in chronic kidney disease; Aphasia; Stage 4 chronic kidney disease (HCC); and Ischemic nephropathy with hypertensive nephrosclerosis on their problem list. Past Medical History:  has a past medical history of Acute kidney failure with lesion of tubular necrosis (08/12/2022), Anemia of chronic renal failure (08/12/2022), Bilateral impacted cerumen (03/25/2021), BPH (benign prostatic hyperplasia) (11/30/2013), Brain bleed (HCC) (05/12/2005), Chronic kidney disease, Chronic kidney disease, stage 4 (severe) (HCC) (02/03/2019), Erectile dysfunction (11/30/2013), Hearing loss (07/28/2022), History of cerebral infarction (09/09/2022), History of cerebrovascular accident (CVA) with residual deficit (02/03/2019), History of CVA (cerebrovascular accident) (02/03/2019), Hypercalcemia (02/03/2019), Hypercholesteremia, Hyperlipidemia (11/07/2015), Hypertension, Orthostatic hypotension (08/12/2022), Pre-transplant evaluation for kidney transplant (11/22/2022), Prediabetes (11/30/2013), Proteinuria (08/12/2022), Secondary hyperparathyroidism, not elsewhere classified (08/12/2022), Sensorineural hearing loss (SNHL) of both ears (11/05/2017), Stage 5 chronic  kidney disease due to benign hypertension (HCC) (08/12/2022), Stroke (HCC), Vitamin D  deficiency (11/30/2013), and Weight loss (09/26/2022). Past Surgical History:   has a past surgical history that includes  Brain surgery (05/12/2005) and Small intestine surgery. Social History:   reports that he quit smoking about 21 years ago. His smoking use included cigarettes. He has never used smokeless tobacco. He reports that he does not drink alcohol and does not use drugs. Family History:  family history includes Cancer in his father and sister; Diabetes in his father and paternal grandfather; Heart disease in his mother; Hypertension in his father; Stroke (age of onset: 41) in his father. Allergies:  has no known allergies.   Medication Reconciliation: Current Outpatient Medications on File Prior to Visit  Medication Sig   Cholecalciferol  50 MCG (2000 UT) TABS Take by mouth.   cinacalcet  (SENSIPAR ) 30 MG tablet TAKE ONE TABLET BY MOUTH WITH BREAKFAST ON MONDAYS AND FRIDAYS   fluticasone  (FLONASE ) 50 MCG/ACT nasal spray 1 spray each nostril twice a day for 3 days, then reduce to 1 spray each nostril daily or every other day through allergy season.   No current facility-administered medications on file prior to visit.   Medications Discontinued During This Encounter  Medication Reason   predniSONE  (DELTASONE ) 20 MG tablet    bisoprolol  (ZEBETA ) 5 MG tablet    REPATHA  SURECLICK 140 MG/ML SOAJ Reorder   amLODipine  (NORVASC ) 10 MG tablet Reorder     Physical Exam:    04/26/2024    8:19 AM 01/21/2024   10:37 AM 12/23/2023    8:51 AM  Vitals with BMI  Height 6' 2.5 6' 2.5   Weight 218 lbs 13 oz 223 lbs 10 oz   BMI 27.73 28.33   Systolic 136 126 861  Diastolic 86 82 88  Pulse 68 71   Vital signs reviewed.  Nursing notes reviewed. Weight trend reviewed. Physical Activity: Sufficiently Active (04/25/2024)   Exercise Vital Sign    Days of Exercise per Week: 6 days    Minutes of Exercise per  Session: 150+ min   General Appearance:  No acute distress appreciable.   Well-groomed, healthy-appearing male.  Well proportioned with no abnormal fat distribution.  Good muscle tone. Pulmonary:  Normal work of breathing at rest, no respiratory distress apparent. SpO2: 98 %  Musculoskeletal: All extremities are intact.  Neurological:  Awake, alert, oriented, and engaged.  No obvious focal neurological deficits or cognitive impairments.  Sensorium seems unclouded.   Speech is clear and coherent with logical content. Psychiatric:  Appropriate mood, pleasant and cooperative demeanor, thoughtful and engaged during the exam     09/08/2023   11:02 AM 09/08/2023    9:59 AM 05/19/2023    9:03 AM 12/11/2022   11:42 AM  PHQ 2/9 Scores  PHQ - 2 Score 0 0 0 0   Office Visit on 04/26/2024  Component Date Value Ref Range Status   WBC 04/26/2024 5.6  4.0 - 10.5 K/uL Final   RBC 04/26/2024 4.83  4.22 - 5.81 Mil/uL Final   Hemoglobin 04/26/2024 12.9 (L)  13.0 - 17.0 g/dL Final   HCT 87/83/7974 38.7 (L)  39.0 - 52.0 % Final   MCV 04/26/2024 80.2  78.0 - 100.0 fl Final   MCHC 04/26/2024 33.3  30.0 - 36.0 g/dL Final   RDW 87/83/7974 13.8  11.5 - 15.5 % Final   Platelets 04/26/2024 305.0  150.0 - 400.0 K/uL Final   Neutrophils Relative % 04/26/2024 52.9  43.0 - 77.0 % Final   Lymphocytes Relative 04/26/2024 34.4  12.0 - 46.0 % Final   Monocytes Relative 04/26/2024 5.6  3.0 - 12.0 % Final   Eosinophils  Relative 04/26/2024 6.5 (H)  0.0 - 5.0 % Final   Basophils Relative 04/26/2024 0.6  0.0 - 3.0 % Final   Neutro Abs 04/26/2024 3.0  1.4 - 7.7 K/uL Final   Lymphs Abs 04/26/2024 1.9  0.7 - 4.0 K/uL Final   Monocytes Absolute 04/26/2024 0.3  0.1 - 1.0 K/uL Final   Eosinophils Absolute 04/26/2024 0.4  0.0 - 0.7 K/uL Final   Basophils Absolute 04/26/2024 0.0  0.0 - 0.1 K/uL Final   Sodium 04/26/2024 142  135 - 145 mEq/L Final   Potassium 04/26/2024 3.7  3.5 - 5.1 mEq/L Final   Chloride 04/26/2024 106  96 - 112  mEq/L Final   CO2 04/26/2024 27  19 - 32 mEq/L Final   Glucose, Bld 04/26/2024 98  70 - 99 mg/dL Final   BUN 87/83/7974 27 (H)  6 - 23 mg/dL Final   Creatinine, Ser 04/26/2024 3.39 (H)  0.40 - 1.50 mg/dL Final   Total Bilirubin 04/26/2024 0.7  0.2 - 1.2 mg/dL Final   Alkaline Phosphatase 04/26/2024 67  39 - 117 U/L Final   AST 04/26/2024 16  5 - 37 U/L Final   ALT 04/26/2024 11  3 - 53 U/L Final   Total Protein 04/26/2024 7.1  6.0 - 8.3 g/dL Final   Albumin 87/83/7974 4.3  3.5 - 5.2 g/dL Final   GFR 87/83/7974 17.67 (L)  >60.00 mL/min Final   Calcium 04/26/2024 10.1  8.4 - 10.5 mg/dL Final   Cholesterol 87/83/7974 100  28 - 200 mg/dL Final   Triglycerides 87/83/7974 80.0  10.0 - 149.0 mg/dL Final   HDL 87/83/7974 37.60 (L)  >60.99 mg/dL Final   VLDL 87/83/7974 16.0  0.0 - 40.0 mg/dL Final   LDL Cholesterol 04/26/2024 46  10 - 99 mg/dL Final   Total CHOL/HDL Ratio 04/26/2024 3   Final   NonHDL 04/26/2024 62.39   Final   Hgb A1c MFr Bld 04/26/2024 5.8  4.6 - 6.5 % Final   Microalb, Ur 04/26/2024 50.1 (H)  0.7 - 1.9 mg/dL Final   Creatinine,U 87/83/7974 78.0  mg/dL Final   Microalb Creat Ratio 04/26/2024 642.6 (H)  0.0 - 30.0 mg/g Final   VITD 04/26/2024 57.87  30.00 - 100.00 ng/mL Final   Phosphorus 04/26/2024 2.9  2.3 - 4.6 mg/dL Final   Uric Acid, Serum 04/26/2024 8.9 (H)  4.0 - 7.8 mg/dL Final  Office Visit on 01/29/2024  Component Date Value Ref Range Status   Site 01/29/2024 NOT GIVEN   Final   Color, Synovial 01/29/2024 RED (A)  STRAW/YELLOW Final   Appearance-Synovial 01/29/2024 BLOODY (A)  CLEAR/HAZY Final   WBC, Synovial 01/29/2024 409 (H)  <150 cells/uL Final   Neutrophil, Synovial 01/29/2024 4  0 - 24 % Final   Lymphocytes-Synovial Fld 01/29/2024 16  0 - 74 % Final   Monocyte/Macrophage 01/29/2024 80 (H)  0 - 69 % Final   Eosinophils-Synovial 01/29/2024 0  0 - 2 % Final   Basophils, % 01/29/2024 0  0 % Final   Synoviocytes, % 01/29/2024 0  0 - 15 % Final   Crystals,  Fluid 01/29/2024   NONE SEEN /HPF Final  Office Visit on 01/21/2024  Component Date Value Ref Range Status   Uric Acid, Serum 01/21/2024 8.4 (H)  4.0 - 7.8 mg/dL Final   Sodium 90/88/7974 138  135 - 145 mEq/L Final   Potassium 01/21/2024 3.9  3.5 - 5.1 mEq/L Final   Chloride 01/21/2024 104  96 -  112 mEq/L Final   CO2 01/21/2024 25  19 - 32 mEq/L Final   Glucose, Bld 01/21/2024 90  70 - 99 mg/dL Final   BUN 90/88/7974 35 (H)  6 - 23 mg/dL Final   Creatinine, Ser 01/21/2024 3.64 (H)  0.40 - 1.50 mg/dL Final   GFR 90/88/7974 16.25 (L)  >60.00 mL/min Final   Calcium 01/21/2024 9.9  8.4 - 10.5 mg/dL Final  Scanned Document on 01/07/2024  Component Date Value Ref Range Status   EGFR 01/06/2024 16.0   Final  Office Visit on 12/14/2023  Component Date Value Ref Range Status   Rheumatoid fact SerPl-aCnc 12/14/2023 <10  <14 IU/mL Final   Uric Acid, Serum 12/14/2023 7.1  4.0 - 7.8 mg/dL Final  Office Visit on 09/08/2023  Component Date Value Ref Range Status   Sodium 09/08/2023 140  135 - 145 mEq/L Final   Potassium 09/08/2023 3.8  3.5 - 5.1 mEq/L Final   Chloride 09/08/2023 104  96 - 112 mEq/L Final   CO2 09/08/2023 28  19 - 32 mEq/L Final   Glucose, Bld 09/08/2023 84  70 - 99 mg/dL Final   BUN 95/70/7974 42 (H)  6 - 23 mg/dL Final   Creatinine, Ser 09/08/2023 4.04 (H)  0.40 - 1.50 mg/dL Final   Total Bilirubin 09/08/2023 0.8  0.2 - 1.2 mg/dL Final   Alkaline Phosphatase 09/08/2023 61  39 - 117 U/L Final   AST 09/08/2023 18  0 - 37 U/L Final   ALT 09/08/2023 12  0 - 53 U/L Final   Total Protein 09/08/2023 7.0  6.0 - 8.3 g/dL Final   Albumin 95/70/7974 4.3  3.5 - 5.2 g/dL Final   GFR 95/70/7974 14.38 (LL)  >60.00 mL/min Final   Calcium 09/08/2023 9.3  8.4 - 10.5 mg/dL Final   VITD 95/70/7974 55.26  30.00 - 100.00 ng/mL Final   Uric Acid, Serum 09/08/2023 8.8 (H)  4.0 - 7.8 mg/dL Final   Cholesterol 95/70/7974 141  0 - 200 mg/dL Final   Triglycerides 95/70/7974 85.0  0.0 - 149.0 mg/dL  Final   HDL 95/70/7974 51.70  >39.00 mg/dL Final   VLDL 95/70/7974 17.0  0.0 - 40.0 mg/dL Final   LDL Cholesterol 09/08/2023 72  0 - 99 mg/dL Final   Total CHOL/HDL Ratio 09/08/2023 3   Final   NonHDL 09/08/2023 89.46   Final   Magnesium 09/08/2023 2.1  1.5 - 2.5 mg/dL Final   Hgb J8r MFr Bld 09/08/2023 5.9  4.6 - 6.5 % Final   PTH 09/08/2023 225 (H)  16 - 77 pg/mL Final   Creatinine, Urine 09/08/2023 147  20 - 320 mg/dL Final   Protein/Creat Ratio 09/08/2023 639 (H)  25 - 148 mg/g creat Final   Protein/Creatinine Ratio 09/08/2023 0.639 (H)  0.025 - 0.148 mg/mg creat Final   Total Protein, Urine 09/08/2023 94 (H)  5 - 25 mg/dL Final   Color, Urine 95/70/7974 YELLOW  YELLOW Final   APPearance 09/08/2023 CLEAR  CLEAR Final   Specific Gravity, Urine 09/08/2023 1.014  1.001 - 1.035 Final   pH 09/08/2023 6.0  5.0 - 8.0 Final   Glucose, UA 09/08/2023 NEGATIVE  NEGATIVE Final   Bilirubin Urine 09/08/2023 NEGATIVE  NEGATIVE Final   Ketones, ur 09/08/2023 NEGATIVE  NEGATIVE Final   Hgb urine dipstick 09/08/2023 NEGATIVE  NEGATIVE Final   Protein, ur 09/08/2023 2+ (A)  NEGATIVE Final   Nitrites, Initial 09/08/2023 NEGATIVE  NEGATIVE Final   Leukocyte Esterase 09/08/2023  NEGATIVE  NEGATIVE Final   WBC, UA 09/08/2023 NONE SEEN  0 - 5 /HPF Final   RBC / HPF 09/08/2023 NONE SEEN  0 - 2 /HPF Final   Squamous Epithelial / HPF 09/08/2023 NONE SEEN  < OR = 5 /HPF Final   Bacteria, UA 09/08/2023 NONE SEEN  NONE SEEN /HPF Final   Hyaline Cast 09/08/2023 NONE SEEN  NONE SEEN /LPF Final   Note 09/08/2023    Final   PSA 09/08/2023 0.99  0.10 - 4.00 ng/mL Final   INR 09/08/2023 1.1 (H)  0.8 - 1.0 ratio Final   Prothrombin Time 09/08/2023 11.2  9.6 - 13.1 sec Final   Reflexve Urine Culture 09/08/2023    Final  Office Visit on 08/03/2023  Component Date Value Ref Range Status   Rapid Strep A Screen 08/03/2023 Negative  Negative Final  Office Visit on 06/15/2023  Component Date Value Ref Range Status    Influenza A, POC 06/15/2023 Negative  Negative Final   Influenza B, POC 06/15/2023 Negative  Negative Final   SARS Coronavirus 2 Ag 06/15/2023 Negative  Negative Final   Rapid Strep A Screen 06/15/2023 Positive (A)  Negative Final  Office Visit on 12/11/2022  Component Date Value Ref Range Status   Hemoglobin A1C 12/11/2022 5.5  4.0 - 5.6 % Final  Office Visit on 11/07/2022  Component Date Value Ref Range Status   Color, UA 11/07/2022 yellow   Final   Clarity, UA 11/07/2022 clear   Final   Glucose, UA 11/07/2022 Negative  Negative Final   Bilirubin, UA 11/07/2022 negative   Final   Ketones, UA 11/07/2022 negative   Final   Spec Grav, UA 11/07/2022 1.015  1.010 - 1.025 Final   Blood, UA 11/07/2022 negative   Final   pH, UA 11/07/2022 6.0  5.0 - 8.0 Final   Protein, UA 11/07/2022 Positive (A)  Negative Final   Urobilinogen, UA 11/07/2022 0.2  0.2 or 1.0 E.U./dL Final   Nitrite, UA 93/71/7975 negative   Final   Leukocytes, UA 11/07/2022 Negative  Negative Final   WBC 11/07/2022 5.5  4.0 - 10.5 K/uL Final   RBC 11/07/2022 4.61  4.22 - 5.81 Mil/uL Final   Hemoglobin 11/07/2022 11.9 (L)  13.0 - 17.0 g/dL Final   HCT 93/71/7975 37.7 (L)  39.0 - 52.0 % Final   MCV 11/07/2022 81.7  78.0 - 100.0 fl Final   MCHC 11/07/2022 31.7  30.0 - 36.0 g/dL Final   RDW 93/71/7975 13.6  11.5 - 15.5 % Final   Platelets 11/07/2022 224.0  150.0 - 400.0 K/uL Final   Neutrophils Relative % 11/07/2022 53.5  43.0 - 77.0 % Final   Lymphocytes Relative 11/07/2022 31.6  12.0 - 46.0 % Final   Monocytes Relative 11/07/2022 7.5  3.0 - 12.0 % Final   Eosinophils Relative 11/07/2022 6.8 (H)  0.0 - 5.0 % Final   Basophils Relative 11/07/2022 0.6  0.0 - 3.0 % Final   Neutro Abs 11/07/2022 2.9  1.4 - 7.7 K/uL Final   Lymphs Abs 11/07/2022 1.7  0.7 - 4.0 K/uL Final   Monocytes Absolute 11/07/2022 0.4  0.1 - 1.0 K/uL Final   Eosinophils Absolute 11/07/2022 0.4  0.0 - 0.7 K/uL Final   Basophils Absolute 11/07/2022 0.0  0.0  - 0.1 K/uL Final   Total CK 11/07/2022 214  7 - 232 U/L Final   Sodium 11/07/2022 142  135 - 145 mEq/L Final   Potassium 11/07/2022 4.3  3.5 -  5.1 mEq/L Final   Chloride 11/07/2022 105  96 - 112 mEq/L Final   CO2 11/07/2022 31  19 - 32 mEq/L Final   Glucose, Bld 11/07/2022 93  70 - 99 mg/dL Final   BUN 93/71/7975 35 (H)  6 - 23 mg/dL Final   Creatinine, Ser 11/07/2022 3.53 (H)  0.40 - 1.50 mg/dL Final   Total Bilirubin 11/07/2022 0.7  0.2 - 1.2 mg/dL Final   Alkaline Phosphatase 11/07/2022 64  39 - 117 U/L Final   AST 11/07/2022 17  0 - 37 U/L Final   ALT 11/07/2022 13  0 - 53 U/L Final   Total Protein 11/07/2022 7.1  6.0 - 8.3 g/dL Final   Albumin 93/71/7975 4.5  3.5 - 5.2 g/dL Final   GFR 93/71/7975 17.01 (L)  >60.00 mL/min Final   Calcium 11/07/2022 10.3  8.4 - 10.5 mg/dL Final   Sed Rate 93/71/7975 24 (H)  0 - 20 mm/hr Final  There may be more visits with results that are not included.  No image results found. US  Guided Needle Placement - No Linked Charges Result Date: 01/29/2024 Ultrasound imaging demonstrates needle placement into the right second MCP joint with aspiration of effusion that resulted after anesthetic injection.  Subsequent imaging demonstrates injection of cortisone.  No complication.        ASSESSMENT & PLAN   Assessment & Plan Essential hypertension, benign Hypertension management is critical, with recent readings above 130 mmHg. The current regimen includes amlodipine , bisoprolol , and Repatha . Bisoprolol  has been increased to 10 mg daily to achieve a target blood pressure under 130 mmHg. Other hyperlipidemia History of cerebrovascular accident (CVA) with residual deficit Hyperlipidemia is managed with Repatha . Recent cholesterol levels require assessment to ensure treatment efficacy. Blood work has been ordered to check cholesterol levels. Ischemic nephropathy with hypertensive nephrosclerosis, stage 1 through stage 4 or unspecified chronic kidney  disease Chronic kidney disease (CKD), active medical management without dialysis, stage 4 (severe) (HCC) Chronic kidney disease stage 4 with hypertensive nephrosclerosis   Chronic kidney disease stage 4 with hypertensive nephrosclerosis has been well-managed for ten years. Kidney function remains stable but is close to the dialysis threshold. Blood pressure control is crucial to prevent further kidney damage, with recent readings above 130 mmHg. Antihypertensive therapy has been adjusted by increasing bisoprolol  to 10 mg daily. ACE inhibitors or ARBs are avoided due to advanced kidney disease. Cinacalcet  30 mg twice weekly continues as per the kidney doctor's plan. Ongoing management is coordinated with the kidney doctor. Chronic cough Chronic cough may be exacerbated by environmental factors such as working in cold conditions. There are no signs of pneumonia or fluid in the lungs, but reduced air movement is noted. The differential includes asthma or other respiratory conditions. A chest x-ray has been ordered to evaluate lung condition, and Tessalon  Perles (benzonatate ) is prescribed for cough management. Allergy, subsequent encounter Allergic rhinitis is managed with levocetirizine. He requested a refill of allergy medication, and levocetirizine has been prescribed. Prediabetes Prediabetes has stable A1c levels over the past 15 years. Recent A1c levels need assessment to monitor the condition. An A1c test has been ordered to monitor prediabetes status.  ORDER ASSOCIATIONS  #   DIAGNOSIS / CONDITION ICD-10 ENCOUNTER ORDER     ICD-10-CM   1. Ischemic nephropathy with hypertensive nephrosclerosis, stage 1 through stage 4 or unspecified chronic kidney disease  I12.9 CBC with Differential/Platelet    Comprehensive metabolic panel with GFR    Lipid panel  Hemoglobin A1c    Microalbumin / creatinine urine ratio    Vitamin D  (25 hydroxy)    Phosphorus    Uric acid    PTH, intact (no Ca)    2.  Essential hypertension, benign  I10 amLODipine  (NORVASC ) 10 MG tablet    bisoprolol  (ZEBETA ) 10 MG tablet    3. Other hyperlipidemia  E78.49 Evolocumab  (REPATHA  SURECLICK) 140 MG/ML SOAJ    4. History of cerebrovascular accident (CVA) with residual deficit  I69.30 Evolocumab  (REPATHA  SURECLICK) 140 MG/ML SOAJ    Lipid panel    5. Chronic cough  R05.3 benzonatate  (TESSALON ) 200 MG capsule    DG Chest 2 View    6. Allergy, subsequent encounter  T78.40XD levocetirizine (XYZAL ) 5 MG tablet    7. Prediabetes  R73.03 Hemoglobin A1c    8. Chronic kidney disease (CKD), active medical management without dialysis, stage 4 (severe) (HCC)  N18.4 Microalbumin / creatinine urine ratio    Vitamin D  (25 hydroxy)         Orders Placed in Encounter:   Lab Orders         CBC with Differential/Platelet         Comprehensive metabolic panel with GFR         Lipid panel         Hemoglobin A1c         Microalbumin / creatinine urine ratio         Vitamin D  (25 hydroxy)         Phosphorus         Uric acid         PTH, intact (no Ca)     Imaging Orders         DG Chest 2 View     Referral Orders  No referral(s) requested today   Meds ordered this encounter  Medications   amLODipine  (NORVASC ) 10 MG tablet    Sig: Take 1 tablet (10 mg total) by mouth daily.    Dispense:  90 tablet    Refill:  2   Evolocumab  (REPATHA  SURECLICK) 140 MG/ML SOAJ    Sig: Inject 140 mg into the skin every 14 (fourteen) days.    Dispense:  6 mL    Refill:  4   bisoprolol  (ZEBETA ) 10 MG tablet    Sig: Take 1 tablet (10 mg total) by mouth daily.    Dispense:  90 tablet    Refill:  4   benzonatate  (TESSALON ) 200 MG capsule    Sig: Take 1 capsule (200 mg total) by mouth 3 (three) times daily as needed for cough.    Dispense:  90 capsule    Refill:  0   levocetirizine (XYZAL ) 5 MG tablet    Sig: Take 1 tablet (5 mg total) by mouth every evening.    Dispense:  90 tablet    Refill:  3     This document was  synthesized by artificial intelligence ([employer]) using HIPAA-compliant recording of the clinical interaction;   We discussed the use of AI scribe software for clinical note transcription with the patient, who gave verbal consent to proceed. additional Info: This encounter employed state-of-the-art, real-time, collaborative documentation. The patient actively reviewed and assisted in updating their electronic medical record on a shared screen, ensuring transparency and facilitating joint problem-solving for the problem list, overview, and plan. This approach promotes accurate, informed care. The treatment plan was discussed and reviewed in detail, including medication safety, potential side effects, and  all patient questions. We confirmed understanding and comfort with the plan. Follow-up instructions were established, including contacting the office for any concerns, returning if symptoms worsen, persist, or new symptoms develop, and precautions for potential emergency department visits.

## 2024-04-27 ENCOUNTER — Ambulatory Visit: Payer: Self-pay | Admitting: Internal Medicine

## 2024-04-28 LAB — PARATHYROID HORMONE, INTACT (NO CA): PTH: 254 pg/mL — ABNORMAL HIGH (ref 16–77)

## 2024-04-28 NOTE — Progress Notes (Signed)
 Defer to nephrology for secondary hyperparathyroidism management. Stable. Chronic.

## 2024-06-13 ENCOUNTER — Ambulatory Visit: Payer: Self-pay | Admitting: *Deleted

## 2024-06-15 ENCOUNTER — Ambulatory Visit: Admitting: Internal Medicine

## 2024-09-13 ENCOUNTER — Ambulatory Visit
